# Patient Record
Sex: Female | Born: 1960 | Race: Black or African American | Hispanic: No | Smoking: Never smoker
Health system: Southern US, Community
[De-identification: ages and names within clinical notes are randomized; demographics above are authoritative.]

## PROBLEM LIST (undated history)

## (undated) DIAGNOSIS — Z8619 Personal history of other infectious and parasitic diseases: Secondary | ICD-10-CM

## (undated) DIAGNOSIS — N200 Calculus of kidney: Secondary | ICD-10-CM

## (undated) DIAGNOSIS — Z5189 Encounter for other specified aftercare: Secondary | ICD-10-CM

## (undated) DIAGNOSIS — K8020 Calculus of gallbladder without cholecystitis without obstruction: Secondary | ICD-10-CM

## (undated) DIAGNOSIS — C50211 Malignant neoplasm of upper-inner quadrant of right female breast: Secondary | ICD-10-CM

## (undated) DIAGNOSIS — D649 Anemia, unspecified: Secondary | ICD-10-CM

## (undated) DIAGNOSIS — Z8711 Personal history of peptic ulcer disease: Secondary | ICD-10-CM

## (undated) DIAGNOSIS — G4730 Sleep apnea, unspecified: Secondary | ICD-10-CM

## (undated) DIAGNOSIS — N390 Urinary tract infection, site not specified: Secondary | ICD-10-CM

## (undated) DIAGNOSIS — E119 Type 2 diabetes mellitus without complications: Secondary | ICD-10-CM

## (undated) DIAGNOSIS — I10 Essential (primary) hypertension: Secondary | ICD-10-CM

## (undated) DIAGNOSIS — Z8719 Personal history of other diseases of the digestive system: Secondary | ICD-10-CM

## (undated) HISTORY — DX: Essential (primary) hypertension: I10

## (undated) HISTORY — DX: Anemia, unspecified: D64.9

## (undated) HISTORY — DX: Type 2 diabetes mellitus without complications: E11.9

## (undated) HISTORY — DX: Malignant neoplasm of upper-inner quadrant of right female breast: C50.211

## (undated) HISTORY — DX: Sleep apnea, unspecified: G47.30

## (undated) HISTORY — DX: Personal history of other infectious and parasitic diseases: Z86.19

## (undated) HISTORY — DX: Urinary tract infection, site not specified: N39.0

## (undated) HISTORY — DX: Personal history of peptic ulcer disease: Z87.11

## (undated) HISTORY — PX: BREAST BIOPSY: SHX20

## (undated) HISTORY — DX: Encounter for other specified aftercare: Z51.89

## (undated) HISTORY — DX: Calculus of kidney: N20.0

## (undated) HISTORY — DX: Personal history of other diseases of the digestive system: Z87.19

## (undated) HISTORY — PX: SPLENECTOMY: SUR1306

## (undated) HISTORY — DX: Calculus of gallbladder without cholecystitis without obstruction: K80.20

---

## 1998-04-30 ENCOUNTER — Ambulatory Visit (HOSPITAL_COMMUNITY): Admission: RE | Admit: 1998-04-30 | Discharge: 1998-04-30 | Payer: Self-pay | Admitting: Urology

## 1998-04-30 ENCOUNTER — Encounter: Payer: Self-pay | Admitting: Urology

## 2001-05-02 ENCOUNTER — Ambulatory Visit (HOSPITAL_BASED_OUTPATIENT_CLINIC_OR_DEPARTMENT_OTHER): Admission: RE | Admit: 2001-05-02 | Discharge: 2001-05-02 | Payer: Self-pay | Admitting: Cardiovascular Disease

## 2002-11-13 ENCOUNTER — Ambulatory Visit (HOSPITAL_BASED_OUTPATIENT_CLINIC_OR_DEPARTMENT_OTHER): Admission: RE | Admit: 2002-11-13 | Discharge: 2002-11-13 | Payer: Self-pay | Admitting: *Deleted

## 2002-11-21 ENCOUNTER — Other Ambulatory Visit: Admission: RE | Admit: 2002-11-21 | Discharge: 2002-11-21 | Payer: Self-pay | Admitting: Obstetrics and Gynecology

## 2003-07-12 DIAGNOSIS — G4730 Sleep apnea, unspecified: Secondary | ICD-10-CM

## 2003-07-12 HISTORY — DX: Sleep apnea, unspecified: G47.30

## 2005-07-11 HISTORY — PX: OTHER SURGICAL HISTORY: SHX169

## 2005-12-27 ENCOUNTER — Inpatient Hospital Stay (HOSPITAL_COMMUNITY): Admission: EM | Admit: 2005-12-27 | Discharge: 2005-12-30 | Payer: Self-pay | Admitting: Emergency Medicine

## 2005-12-28 ENCOUNTER — Ambulatory Visit: Payer: Self-pay | Admitting: Cardiology

## 2005-12-28 ENCOUNTER — Encounter: Payer: Self-pay | Admitting: Cardiology

## 2007-07-12 DIAGNOSIS — Z5189 Encounter for other specified aftercare: Secondary | ICD-10-CM

## 2007-07-12 DIAGNOSIS — D649 Anemia, unspecified: Secondary | ICD-10-CM

## 2007-07-12 HISTORY — DX: Anemia, unspecified: D64.9

## 2007-07-12 HISTORY — DX: Encounter for other specified aftercare: Z51.89

## 2008-06-16 ENCOUNTER — Ambulatory Visit: Payer: Self-pay | Admitting: Obstetrics & Gynecology

## 2008-06-16 ENCOUNTER — Observation Stay (HOSPITAL_COMMUNITY): Admission: AD | Admit: 2008-06-16 | Discharge: 2008-06-17 | Payer: Self-pay | Admitting: Obstetrics & Gynecology

## 2008-06-25 ENCOUNTER — Ambulatory Visit: Payer: Self-pay | Admitting: Obstetrics & Gynecology

## 2008-06-25 ENCOUNTER — Encounter: Payer: Self-pay | Admitting: Obstetrics & Gynecology

## 2008-06-25 LAB — CONVERTED CEMR LAB
HCT: 26.5 % — ABNORMAL LOW (ref 36.0–46.0)
MCV: 80.5 fL (ref 78.0–100.0)

## 2010-08-01 ENCOUNTER — Encounter: Payer: Self-pay | Admitting: *Deleted

## 2010-08-30 ENCOUNTER — Encounter (INDEPENDENT_AMBULATORY_CARE_PROVIDER_SITE_OTHER): Payer: Self-pay | Admitting: *Deleted

## 2010-09-07 NOTE — Letter (Signed)
Summary: Plan of Care, Need to Discuss  [facility]  [address]   [place], [place] [address]   Phone: [phone]  Fax: [phone]    August 30, 2010  Kaitlyn Cervantes [address] [place], [place]  [address] June 12, 1961   Dear [name],   We are writing this letter to inform you of treatment plans and/or discuss your plan of care.  We have tried several times to contact you; however, we have yet to reach you.  We ask that you please contact our office for follow-up on your gastrointestinal issues.  We can  be reached at [phone] to schedule an appointment, or to speak with someone regarding your health care needs.  Please do not neglect your health.   Sincerely,    Rexene Alberts  [facility] Ph: [phone]    Fax: [phone]

## 2010-11-23 NOTE — Group Therapy Note (Signed)
NAMEDIALA, WAXMAN NO.:  [id number]   MEDICAL RECORD NO.:  [id number]          PATIENT TYPE:  WOC   LOCATION:  [facility]                   FACILITY:  [facility]   PHYSICIAN:  Sid Falcon, CNM  DATE OF BIRTH:  21-Nov-1960   DATE OF SERVICE:  06/25/2008                                  CLINIC NOTE   CHIEF COMPLAINT:  Increased vaginal bleeding that began on November 25  that has continued.  The patient was seen at the maternity admissions  unit on December 7, in which her hemoglobin was found to be 6.6 and  hematocrit 20.2.  The patient was symptomatic, dizzy, and had  orthostatic blood pressures.  The patient, initially, refused blood  transfusion; however, agreed and received 3 units of packed red blood  cells in which the dizziness resolved.  The patient was instructed to  follow up at the [facility] and hence, is here today for  followup.  The patient is reporting decreased bleeding today, has been  taking the Provera 10 mg p.o. for the past 3 days, has 3 more days left.   CURRENT MEDICATIONS:  Provera 10 mg, Norvasc 5 mg daily, lisinopril 20  mg daily, hydrochlorothiazide 25 mg daily.   MENSTRUAL HISTORY:  June 04, 2008 reports regular cycles up until 2  years ago, 21 days in between cycles, and the last one was 7 days.  No  bleeding in between periods.   OBSTETRICAL HISTORY:  A history of 4 pregnancies with 3 children all  resulting in a C. section.  Primary  C. section was due to fetal  distress.  Bilateral tubal ligation was done at the last C. section 19  years ago in 1990.   GYNECOLOGICAL HISTORY:  Last Pap smear 2005.  The patient denies history  of abnormal Pap smear.  The patient reports never having a mammogram.   SURGICAL HISTORY:  Kidney stones, splenectomy at 50 years of age.   FAMILY HISTORY:  Diabetes:  Husband, daughter, and mother.  High blood  pressure:  Patient.  Cancer:  Mother had breast cancer and had a double  mastectomy, resulting in death 2 years ago.   SOCIAL HISTORY:  The patient does work outside the home, lives with  husband.  No smoking, no illicit drug use, and denies current __________  abuse.   REVIEW OF SYSTEMS:  The patient is reporting swelling in legs and  occasional shortness of breath; is receiving followup with primary care  physician.   PHYSICAL EXAMINATION:  Temperature 98.4, pulse 80, blood pressure  152/89, weight 258 pounds.  Patient is alert and oriented x3, no signs  of acute distress.  No visible evidence of respiratory distress.  NECK:  No thyromegaly.  CHEST:  Lungs clear throughout auscultation bilaterally.  CARDIOVASCULAR:  Regular rate and rhythm without murmurs, gallops, or  rubs.  BREAST:  Nontender with palpation bilaterally.  No retractions.  No  nipple discharge or bleeding.  Small palpable lesion on right breast  approximately at 3 o'clock approximately 1 x 1 cm in size, mobile.  VAGINA:  Moderate amount of blood.  CERVIX:  Nontender with palpation.  Pap deferred secondary to increased  bleeding.   ASSESSMENT:  1. Vaginal bleeding.  2. Hypertension.  3. Family history of breast cancer.   PLAN:  Dr. Marice Potter consults with the patient in the room.  Will do Provera  10 mg x10 days for 3 months.  After the 3 months, patient will return  with followup with repeat ultrasound to check for endometrial  thickening.  If bleeding returns and increases we will do biopsy at that  time.  Patient will schedule an appointment at end of January for a Pap  smear once bleeding is not as much.      Sid Falcon, CNM     WM/MEDQ  D:  06/25/2008  T:  06/25/2008  Job:  [id number]

## 2010-11-26 NOTE — Discharge Summary (Signed)
NAMEKENDRAH, Kaitlyn Cervantes               ACCOUNT NO.:  [id number]   MEDICAL RECORD NO.:  [id number]          PATIENT TYPE:  INP   LOCATION:  3741                         FACILITY:  [facility]   PHYSICIAN:  Isidor Holts, M.D.  DATE OF BIRTH:  October 17, 1960   DATE OF ADMISSION:  12/27/2005  DATE OF DISCHARGE:  12/30/2005                                 DISCHARGE SUMMARY   PMD:  Unassigned.   DISCHARGE DIAGNOSES:  1.  Atypical right-sided chest pain, workup negative.  2.  Cervical spine degenerative joint disease, C4-C5 herniated disc/right      radiculopathy and spinal stenosis.  3.  Uncontrolled hypertension.  4.  Morbid obesity.  5.  Peptic ulcer disease/gastroesophageal reflux disease.   DISCHARGE MEDICATIONS:  1.  Hydrochlorothiazide 25 mg p.o. daily.  2.  Lisinopril 20 mg p.o. daily.  3.  Atenolol 25 mg p.o. daily.  4.  Prednisone 40 mg p.o. daily for 2 days, then 30 mg p.o. 2 day for 3      days, then 20 mg p.o. daily for 3 days and then 10 mg p.o. daily for 3      days and then stop.  5.  Darvocet-N 100 one p.o. p.r.n. q.8h. A total of 90 pills have been      dispensed.   PROCEDURES:  1.  Two-view chest x-ray dated December 27, 2005.  This showed no evidence of      active cardiopulmonary disease.  2.  MRI cervical spine dated December 28, 2005.  This showed moderately enlarged      disc protrusion on the right at C4-C5 with mass effect on the cord,      small central disc protrusion C5-C6 and C6-C7.  Multilevel DJD.  3.  Chest CT angiogram dated December 29, 2005.  This showed no evidence for      acute pulmonary embolism and is negative for aortic dissection or      pulmonary edema.  4.  Abdominal CT angiogram dated December 29, 2005.  This showed no evidence for      aortic dissection.  There was right nephrolithiasis.  5.  2-D echocardiogram dated December 28, 2005.  This showed LVEF 65% with no      left ventricular regional wall motion abnormalities.  This was      considered a normal echo  study.   CONSULTATIONS:  Dr. Wynetta Emery, neurosurgeon.   ADMISSION HISTORY:  As in H&P note of December 27, 2005 dictated by Dr. Jamison Oka.  However, in brief, this is a 50 year old female, with known history  of hypertension, prior peptic ulcer disease and GERD, noncompliant with anti-  hypertensive medications for the past 1-1/2 years, who presents with right-  sided chest pain associated with shortness of breath and palpitations of a  transient nature.  Subsequently a couple of days later, she developed  similar chest pain, associated with right-sided neck pain, radiating to the  shoulder.  According to the patient, she has been having some difficulty  rotating her neck and could not lift her right arm, secondary to pain.  She  presented to the emergency department and was admitted for further  evaluation, investigation and management.   CLINICAL COURSE:  1.  Atypical right-sided chest pain.  The patient presents with right-sided      chest pain of atypical nature, not associated with coughing or sputum      production, although she did mention shortness of breath and      palpitations.  Two-view chest x-ray was quite unremarkable.  D-dimer was      within normal limits.  Subsequent chest CT angiogram showed no evidence      of pulmonary embolism or aortic dissection, although there was mild      pulmonary edema.  Abdominal CT angiogram done at the same time, showed      incidental right nephrolithiasis.  2-D echocardiogram showed ejection      fraction of 65%.  There were no regional wall motion abnormalities.      This was considered a normal 2-D echocardiogram.  The patient therefore      has been reassured, as to the atypical nature of her chest pain.   1.  Severe uncontrolled hypertension.  The patient is a known hypertensive,      but has been noncompliant with anti-hypertensive medications, for the      past 1-1/2 years.  At the time of presentation in the emergency       department, she was found to have a blood pressure which was      significantly elevated at 181/97.  She was managed with a combination of      hydrochlorothiazide, ACE inhibitor and beta blocker with adequate blood      pressure control.  As of December 30, 2005, her blood pressure was      reasonably controlled at 144/68 mmHg.  Incidentally, on December 29, 2005      the patient had experienced palpitations, associated with a sinus      tachycardia seen in the rhythm strip on telemetry monitoring, with heart      rate in the 130s.  The patient was therefore placed on a beta blocker,      with complete amelioration of symptoms.  It is possible that anxiety may      have been contributory to this.  The patient's TSH was normal at 1.044.      As noted in #1 above, chest CT angiogram showed mild pulmonary edema.      This was probably secondary to left heart strain, as a result of      uncontrolled hypertension.  Certainly clinically, the patient had no      evidence of heart failure and 2-D echocardiogram is normal.  However,      this has been adequately controlled with ACE inhibition, diuretic and      beta blocker.  Clearly, the patient will need a primary M.D. to continue      to follow up routinely, and adjust medications as appropriate.   1.  History of GERD/peptic ulcer disease.  There were no symptoms referable      to these, during the course of hospital stay.   1.  Cervical spine DJD/spinal stenosis/radiculopathy.  The patient presents      with right-sided neck pain, difficulty in rotating neck and also right      shoulder pain.  These responded to analgesics, administered during the      course of her hospital stay.  However, on suspicion of possible  radiculopathy, neck MRI was carried out on December 28, 2005, which showed      multilevel DJD of the cervical spine and also moderately large disc     protrusion on the right at C4-C5, with mass effect on the cord.  There      was also a  small central disc protrusion at C5-C6 and C6-C7.  Dr. Wynetta Emery,      neurosurgeon was called in consultation.  He has evaluated the patient,      and agrees with analgesics and tapering course of steroids and feels      that in the future, the patient may require surgery, i.e., ACDF, but      states that this is not emergent.  He is quite willing to see patient on      follow up in his office following discharge, and has requested patient      to call, telephone [phone] to schedule an appointment.   1.  Morbid obesity.  The patient has been given dietary counseling and      advice.  Lipid profile showed the following findings:  Total cholesterol      187, triglycerides 87, HDL 39, LDL 131, i.e., fairly reasonable lipid      profile.   DISPOSITION:  The patient was considered sufficiently recovered and  clinically stable, to be discharged on December 30, 2005.  She was instructed to  return to work on January 02, 2006.   DIET:  Healthy Heart Diet.   ACTIVITY:  As tolerated.  The patient is recommended not to lift objects  over 10 pounds in weight.   WOUND CARE:  Not applicable.   PAIN MANAGEMENT:  Refer to discharge medication list.   FOLLOWUP INSTRUCTIONS:  The patient has been instructed to call Dr. Lonie Peak  office following discharge, i.e., telephone [phone] to schedule a  followup appointment.  She has also been strongly recommended to establish a  primary M.D. and follow up of background medical problems and preventive  care.  She has been supplied a telephone number for [facility].  She has assured me that she will call and make an appointment.      Isidor Holts, M.D.  Electronically Signed     CO/MEDQ  D:  12/30/2005  T:  12/30/2005  Job:  [id number]   cc:   Donalee Citrin, M.D.  Fax: [phone]

## 2010-11-26 NOTE — H&P (Signed)
Cervantes, Kaitlyn               ACCOUNT NO.:  [id number]   MEDICAL RECORD NO.:  [id number]          PATIENT TYPE:  EMS   LOCATION:  [facility]                         FACILITY:  [facility]   PHYSICIAN:  Mobolaji B. Bakare, M.D.DATE OF BIRTH:  Nov 25, 1960   DATE OF ADMISSION:  12/27/2005  DATE OF DISCHARGE:                                HISTORY & PHYSICAL   PRIMARY CARE PHYSICIAN:  Unassigned.   CHIEF COMPLAINT:  Chest pain for three days.   HISTORY OF PRESENT COMPLAINT:  [name] is a 50 year old African-American  female with known history of hypertension.  She quit using her medication  about 1-1/2 years ago.  She was in her usual state of health until this past  Sunday when she developed chest pain, right-sided.  It was not related to  exertion.  She had some shortness of breath and palpitations.  The pain  lasted less than 30 minutes.  It went away completely.  She subsequently  started feeling weak all through Monday.  This morning on waking up, she  developed similar chest pain, right-sided, radiating to her neck, shoulder.  There were no associated headaches.  The pain was dull in nature.  It lasted  approximately 30 minutes.  She could not turn her neck and could not lift  her right hand secondary to severe pain.  She was brought to the emergency  room by private vehicle.  She did receive two aspirin prior to arrival and  pain abated prior to arrival in the emergency room.   She has been evaluated here.  Two sets of cardiac markers at the point-of-  care have been negative.   EKG showed a normal sinus rhythm without acute ST changes.  The patient is  currently chest painfree, but she feels uncomfortable returning home; hence,  [facility] was called for admission and further evaluation.   She has had a D-dimer done, which is within normal.   She has no shortness of breath now.  No pleuritic chest pain.   REVIEW OF SYSTEMS:  She noted ankle swelling since three days ago,  which has  improved on elevation of her legs.  There are no headaches, changes of  vision.  No cough, diarrhea, abdominal pain, constipation.  She denies  paroxysmal nocturnal edema, but she thinks she has problems breathing  whenever she lays down; however, she uses only one pillow to sleep.  She has  had left-sided chest pain in the past, but this has never been evaluated.   PAST MEDICAL HISTORY:  Hypertension, for which she quit using medications 1-  1/2 years ago.  Patient has had peptic ulcer before and heartburn but  current complaints are not suggestive of similar heartburn.   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  The reason  for splenectomy is unknown.  She has had previous cesarean sections.   MEDICATIONS:  None.   ALLERGIES:  None.   FAMILY HISTORY:  She is married.  She has three children.  She is  accompanied by her daughter.  Mother is battling breast cancer, which  was  diagnosed five years ago.  She is not having recurrence.  The patient has  not had mammography, so I have advised her to have mammography done.  Father  died at the age of 84 years from heart failure.   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  Sits on the  computer all day.  She is fairly sedentary.  No history of drug abuse,  alcohol, or cigarette smoking.   PHYSICAL EXAMINATION:  INITIAL VITALS:  Blood pressure 181/97, now 154/95.  Pulse 74, respiratory rate 20, O2 sats 96% on room air.  Temperature 99.4.  Rechecked was 98.7.  GENERAL:  Patient is obese, not in respiratory distress.  HEENT:  Normocephalic and atraumatic head.  Pupils are equal, round and  reactive to light.  Extraocular muscles are intact.  Not pale.  Anicteric.  NECK:  No elevated JVD.  No carotid bruits.  No thyromegaly.  LUNGS:  Clear clinically to auscultation.  CVS:  S1 and S2 regular.  No murmur or gallop.  ABDOMEN:  Obese, soft, nontender.  No palpable organomegaly.  No   holosystolic murmur.  EXTREMITIES:  Trace pedal edema around the ankle.  Dorsalis pedis pulses  palpable bilaterally.  No cyanosis.  CNS:  Muscle power is 5/5 in all limbs.  Cranial nerves are intact.  No  focal neurological deficits.  MUSCULOSKELETAL:  There is no tenderness in the cervical region, over the  shoulder, or anterior chest wall.   INITIAL LABORATORY DATA:  Cardiac markers normal x1 set.  BNP less than 30.  D-dimer 0.22.  Venous pH 7.35, pCO2 41.  Hemoglobin 12.2, hematocrit 36.  Sodium 138, potassium 4, chloride 108, glucose 113, BUN 9, creatinine 0.7.   EKG showed normal sinus rhythm with a heart rate of 87.  No acute ST  changes.  There are nonspecific T wave abnormalities in V6.   CHEST X-RAY:  No acute cardiopulmonary disease.   ASSESSMENT/PLAN:  [name] is a 50 year old African-American female who is  morbidly obese.  Has hypertension.  She has not been on medications for 1-  1/2 years.  She is presenting with right-sided chest pain, neck pain, and  uncontrolled blood pressure.  She will be admitted for evaluation.  Problem 1:  Chest pain, which is atypical:  She has a low pretest  probability for pulmonary embolism.  D-dimer is negative.  She will be  admitted to telemetry.  Cycle cardiac enzymes.  Give Protonix 40 mg daily,  Mylanta p.r.n., aspirin 325 mg daily.  Check fasting lipid profile.  Hemoglobin A1C.  Fasting blood sugar.  We will rule out aortic dissection  with CT angiogram of the chest.  Obtain a 2D echocardiogram.  The patient  has had previous left-sided chest pain in the past.  Consideration will be  given to stress test if no other etiology found.  Problem 2:  Neck pain:  If cardiac workup is negative, consideration may be  given to assessing for cervical radiculopathy.  Patient is currently not  symptomatic.  Neck pain has resolved. Problem 3:  Uncontrolled hypertension:  Will start on hydrochlorothiazide  12.5 mg daily, lisinopril 20 mg  daily.  Problem 4:  Obesity:  Check TSH.  Give weight loss counseling.      Mobolaji B. Corky Downs, M.D.  Electronically Signed    MBB/MEDQ  D:  12/27/2005  T:  12/27/2005  Job:  [id number]

## 2011-04-15 LAB — CROSSMATCH: ABO/RH(D): O POS

## 2011-04-15 LAB — HEMOGLOBIN AND HEMATOCRIT, BLOOD: Hemoglobin: 6.6 g/dL — CL (ref 12.0–15.0)

## 2011-04-15 LAB — WET PREP, GENITAL
Clue Cells Wet Prep HPF POC: NONE SEEN
Trich, Wet Prep: NONE SEEN
Yeast Wet Prep HPF POC: NONE SEEN

## 2011-04-15 LAB — CBC
Hemoglobin: 8.6 g/dL — ABNORMAL LOW (ref 12.0–15.0)
MCV: 79.8 fL (ref 78.0–100.0)
RBC: 3.13 MIL/uL — ABNORMAL LOW (ref 3.87–5.11)
RDW: 16.1 % — ABNORMAL HIGH (ref 11.5–15.5)

## 2011-04-15 LAB — GC/CHLAMYDIA PROBE AMP, GENITAL: GC Probe Amp, Genital: NEGATIVE

## 2011-09-06 ENCOUNTER — Encounter: Payer: Self-pay | Admitting: Internal Medicine

## 2011-10-11 ENCOUNTER — Ambulatory Visit: Payer: Self-pay | Admitting: *Deleted

## 2011-10-11 NOTE — Progress Notes (Signed)
Pt is a NOS to her PV.  Attempted to reach her at her home twice and her work once.  No id on home machine, no message left.  NOS letter sent o pt and colonoscopy for 10-19-11 cancelled

## 2011-10-19 ENCOUNTER — Encounter: Payer: Self-pay | Admitting: Internal Medicine

## 2013-10-29 ENCOUNTER — Ambulatory Visit (INDEPENDENT_AMBULATORY_CARE_PROVIDER_SITE_OTHER): Payer: BC Managed Care – PPO | Admitting: Family Medicine

## 2013-10-29 ENCOUNTER — Encounter: Payer: Self-pay | Admitting: Family Medicine

## 2013-10-29 VITALS — BP 165/76 | HR 89 | Temp 98.6°F | Ht 64.0 in | Wt 245.0 lb

## 2013-10-29 DIAGNOSIS — E119 Type 2 diabetes mellitus without complications: Secondary | ICD-10-CM

## 2013-10-29 DIAGNOSIS — D649 Anemia, unspecified: Secondary | ICD-10-CM | POA: Insufficient documentation

## 2013-10-29 DIAGNOSIS — G4733 Obstructive sleep apnea (adult) (pediatric): Secondary | ICD-10-CM

## 2013-10-29 DIAGNOSIS — Z8669 Personal history of other diseases of the nervous system and sense organs: Secondary | ICD-10-CM

## 2013-10-29 DIAGNOSIS — I10 Essential (primary) hypertension: Secondary | ICD-10-CM

## 2013-10-29 DIAGNOSIS — Z87442 Personal history of urinary calculi: Secondary | ICD-10-CM | POA: Insufficient documentation

## 2013-10-29 LAB — COMPLETE METABOLIC PANEL WITH GFR
ALBUMIN: 3.8 g/dL (ref 3.5–5.2)
ALT: 18 U/L (ref 0–35)
AST: 17 U/L (ref 0–37)
Alkaline Phosphatase: 70 U/L (ref 39–117)
BUN: 16 mg/dL (ref 6–23)
CO2: 30 mEq/L (ref 19–32)
CREATININE: 0.79 mg/dL (ref 0.50–1.10)
Calcium: 9.2 mg/dL (ref 8.4–10.5)
Chloride: 104 mEq/L (ref 96–112)
GFR, Est Non African American: 86 mL/min
GLUCOSE: 147 mg/dL — AB (ref 70–99)
Potassium: 4.3 mEq/L (ref 3.5–5.3)
Sodium: 139 mEq/L (ref 135–145)
Total Bilirubin: 0.4 mg/dL (ref 0.2–1.2)
Total Protein: 6.3 g/dL (ref 6.0–8.3)

## 2013-10-29 LAB — CBC WITH DIFFERENTIAL/PLATELET
BASOS ABS: 0 10*3/uL (ref 0.0–0.1)
Basophils Relative: 0 % (ref 0–1)
EOS PCT: 2 % (ref 0–5)
Eosinophils Absolute: 0.1 10*3/uL (ref 0.0–0.7)
HEMATOCRIT: 32 % — AB (ref 36.0–46.0)
HEMOGLOBIN: 10.5 g/dL — AB (ref 12.0–15.0)
Lymphocytes Relative: 44 % (ref 12–46)
Lymphs Abs: 2.7 10*3/uL (ref 0.7–4.0)
MCH: 25.3 pg — ABNORMAL LOW (ref 26.0–34.0)
MCHC: 32.8 g/dL (ref 30.0–36.0)
MCV: 77.1 fL — AB (ref 78.0–100.0)
MONO ABS: 0.5 10*3/uL (ref 0.1–1.0)
MONOS PCT: 8 % (ref 3–12)
Neutro Abs: 2.8 10*3/uL (ref 1.7–7.7)
Neutrophils Relative %: 46 % (ref 43–77)
Platelets: 229 10*3/uL (ref 150–400)
RBC: 4.15 MIL/uL (ref 3.87–5.11)
RDW: 14.6 % (ref 11.5–15.5)
WBC: 6.1 10*3/uL (ref 4.0–10.5)

## 2013-10-29 LAB — POCT GLYCOSYLATED HEMOGLOBIN (HGB A1C): HEMOGLOBIN A1C: 7.2

## 2013-10-29 LAB — TSH: TSH: 1.17 u[IU]/mL (ref 0.350–4.500)

## 2013-10-29 LAB — LDL CHOLESTEROL, DIRECT: Direct LDL: 139 mg/dL — ABNORMAL HIGH

## 2013-10-29 MED ORDER — HYDROCHLOROTHIAZIDE 25 MG PO TABS: 25.0000 mg | ORAL_TABLET | Freq: Every day | ORAL | Status: DC

## 2013-10-29 MED ORDER — METFORMIN HCL 500 MG PO TABS: 500.0000 mg | ORAL_TABLET | Freq: Two times a day (BID) | ORAL | Status: DC

## 2013-10-29 MED ORDER — BLOOD GLUCOSE MONITORING SUPPL W/DEVICE KIT: PACK | Status: DC

## 2013-10-29 NOTE — Assessment & Plan Note (Signed)
Obtain kidney function today.

## 2013-10-29 NOTE — Assessment & Plan Note (Addendum)
Patient with hypertension today 165/76. Patient with hypertension history since 2006.  - Will start patient on HCTZ 25 mg. - She'll schedule nurse followup with one week after starting medication to have her blood pressure check. - Followup dependent upon blood pressure check, no more than 3 months.

## 2013-10-29 NOTE — Assessment & Plan Note (Signed)
We'll need to order a sleep study for the patient.  She likely is in need of a CPAP machine given her history. Will place referral today.

## 2013-10-29 NOTE — Progress Notes (Signed)
Subjective:    Patient ID: Kaitlyn Cervantes, female    DOB: [birth year], 53 y.o.   MRN: [id number]  HPI Kaitlyn Cervantes is a 53 y.o. African American female, presented clinic today for new patient establishment.  Hypertension: Patient states that she's had diagnosis of hypertension since 2006. However she admits to not having good medical care for herself or over the last few years. She has not been taking medications over the last year at least. She states she's been staying home taking care of her husband who had recently suffered from a stroke. She states that she use to take lisinopril and HCTZ for her hypertension. She denies chest pain, shortness of breath, palpitations, dyspnea, headache, dizziness or syncopal episodes. She endorses some mild visual changes she attributes to needing glasses. She does not monitor her blood pressure at home.  Diabetes: Patient reports she has been a diabetic since 2012. She does not watch her diet. She is familiar with diabetes because her daughter and husband both have diabetes. She does not monitor her blood glucose level. She does not have a monitoring kit. She has used her husband's kit on a few occasions over the last year and her blood sugars seem to run between 200 and 300. She states she use to take metformin and then Januvia. She denies any nonhealing wounds on her feet. She denies any numbness or tingling of her extremities.  Sleep apnea: Patient reports that she's had sleep apnea since 2005. At one point in time she was wearing his CPAP at night. She doesn't recall the last time she has worn her CPAP and states it's been years. She admits to snoring. She admits her family members say that she gasps for air on occasions during sleeping. She endorses daytime fatigue.  Kidney stones: Patient reports a history of kidney stones in 2007. She states at that time she had undergone lithotripsy. She doesn't recall if it was one-sided or bilateral. She has not had  any issues with kidney stones since that time. She has concerns about her kidney function today. She denies any dysuria, frequency or low back pain.  Anemia: Patient reports severe anemia back in 2009. She states she had a menstrual bleed for over a month. She was seen at [facility] hospital for this and required transfusions. She believes that was her last menstrual period.   Health maintenance: Patient's LMP was likely in 2009. Her last Pap was in 2014, all Paps have been normal. There is no mammogram in the system for her, she states it was completed 2014. She has never had a colonoscopy. Tetanus shot in 2012. Patient thinks he had a stress test in 2005.  Social: Patient is a nonsmoker, full-time employee for the [employer]. She's had 3 C-sections gravida 3 para 3.  She has no religious preferences. She does not exercise regularly. She does not use recreational drugs. She does not use alcohol. She feels safe in her relationship. Review of Systems Negative, with the exception of above mentioned in HPI     Objective:   Physical Exam BP 165/76  Pulse 89  Temp(Src) 98.6 F (37 C) (Oral)  Ht $R'5\' 4"'Ce$  (1.626 m)  Wt 245 lb (111.131 kg)  BMI 42.03 kg/m2 Gen: NAD. Pleasant, obese African American female. HEENT: AT. [place]. Bilateral TM visualized and normal in appearance, mild fluid behind tympanic membrane right greater than left. Bilateral eyes without injections or icterus. MMM. Bilateral nares pale. Throat without erythema or exudates.  Neck: Supple. Mild fullness of her thyroid. Trachea midline. No lymphadenopathy CV: RRR,  No murmurs clicks gallops or rubs. Chest: CTAB, no wheeze or crackles Abd: Soft. Obese .NTND. BS present. No Masses palpated.  Ext: No erythema. No edema. +2/4 posterior tibialis pulse  Skin: No rashes, purpura or petechiae.  Neuro:  Normal gait. PERLA. EOMi. Alert. Grossly intact.  Psych: Normal affect, dressed, demeanor and mood. Normal speech

## 2013-10-29 NOTE — Assessment & Plan Note (Signed)
Patient with history of significant anemia. We'll obtain CBC today in the office. Will call patient with lab results and decide upon further management after results are obtained

## 2013-10-29 NOTE — Patient Instructions (Signed)
Blood Glucose Monitoring, Adult  Monitoring your blood glucose (also know as blood sugar) helps you to manage your diabetes. It also helps you and your health care provider monitor your diabetes and determine how well your treatment plan is working.  WHY SHOULD YOU MONITOR YOUR BLOOD GLUCOSE?  · It can help you understand how food, exercise, and medicine affect your blood glucose.  · It allows you to know what your blood glucose is at any given moment. You can quickly tell if you are having low blood glucose (hypoglycemia) or high blood glucose (hyperglycemia).  · It can help you and your health care provider know how to adjust your medicines.  · It can help you understand how to manage an illness or adjust medicine for exercise.  WHEN SHOULD YOU TEST?  Your health care provider will help you decide how often you should check your blood glucose. This may depend on the type of diabetes you have, your diabetes control, or the types of medicines you are taking. Be sure to write down all of your blood glucose readings so that this information can be reviewed with your health care provider. See below for examples of testing times that your health care provider may suggest.  Type 1 Diabetes  · Test 4 times a day if you are in good control, using an insulin pump, or perform multiple daily injections.  · If your diabetes is not well-controlled or if you are sick, you may need to monitor more often.  · It is a good idea to also monitor:  · Before and after exercise.  · Between meals and 2 hours after a meal.  · Occasionally between 2:00 to 3:00 am.  Type 2 Diabetes  · It can vary with each person, but generally, if you are on insulin, test 4 times a day.  · If you take medicines by mouth (orally), test 2 times a day.  · If you are on a controlled diet, test once a day.  · If your diabetes is not well controlled or if you are sick, you may need to monitor more often.  HOW TO MONITOR YOUR BLOOD GLUCOSE  Supplies  Needed  · Blood glucose meter.  · Test strips for your meter. Each meter has its own strips. You must use the strips that go with your own meter.  · A pricking needle (lancet).  · A device that holds the lancet (lancing device).  · A journal or log book to write down your results.  Procedure  · Wash your hands with soap and water. Alcohol is not preferred.  · Prick the side of your finger (not the tip) with the lancet.  · Gently milk the finger until a small drop of blood appears.  · Follow the instructions that come with your meter for inserting the test strip, applying blood to the strip, and using your blood glucose meter.  Other Areas to Get Blood for Testing  Some meters allow you to use other areas of your body (other than your finger) to test your blood. These areas are called alternative sites. The most common alternative sites are:  · The forearm.  · The thigh.  · The back area of the lower leg.  · The palm of the hand.  The blood flow in these areas is slower. Therefore, the blood glucose values you get may be delayed, and the numbers are different from what you would get from your fingers. Do not use alternative   sites if you think you are having hypoglycemia. Your reading will not be accurate. Always use a finger if you are having hypoglycemia. Also, if you cannot feel your lows (hypoglycemia unawareness), always use your fingers for your blood glucose checks.  ADDITIONAL TIPS FOR GLUCOSE MONITORING  · Do not reuse lancets.  · Always carry your supplies with you.  · All blood glucose meters have a 24-hour "hotline" number to call if you have questions or need help.  · Adjust (calibrate) your blood glucose meter with a control solution after finishing a few boxes of strips.  BLOOD GLUCOSE RECORD KEEPING  It is a good idea to keep a daily record or log of your blood glucose readings. Most glucose meters, if not all, keep your glucose records stored in the meter. Some meters come with the ability to download  your records to your home computer. Keeping a record of your blood glucose readings is especially helpful if you are wanting to look for patterns. Make notes to go along with the blood glucose readings because you might forget what happened at that exact time. Keeping good records helps you and your health care provider to work together to achieve good diabetes management.   Document Released: 06/30/2003 Document Revised: 02/27/2013 Document Reviewed: 11/19/2012  [employer]® Patient Information ©2014 [employer].

## 2013-10-29 NOTE — Assessment & Plan Note (Signed)
Patient with hemoglobin A1c 7.2 today. Patient has not been medicated since 2012. Routine lab work ordered today. We'll start a low-dose metformin slow increase. Patient aware to set up appointment with diabetes education Lamont Dowdy. Will call in monitor and supplies today. Patient to check her blood sugar fasting every morning and after dinner. Followup in 4 weeks

## 2013-10-30 ENCOUNTER — Telehealth: Payer: Self-pay | Admitting: Family Medicine

## 2013-10-30 DIAGNOSIS — E785 Hyperlipidemia, unspecified: Secondary | ICD-10-CM | POA: Insufficient documentation

## 2013-10-30 MED ORDER — SIMVASTATIN 20 MG PO TABS: 20.0000 mg | ORAL_TABLET | Freq: Every day | ORAL | Status: DC

## 2013-10-30 NOTE — Telephone Encounter (Signed)
Please call pt and inform all her her labs were normal, with the exception of elevated sugar and cholesterol. I would like her to start a cholesterol lowering medication. I have called this in for her. I would also send her a copy of these labs in the mail. Thanks.

## 2013-12-23 ENCOUNTER — Other Ambulatory Visit: Payer: Self-pay | Admitting: *Deleted

## 2013-12-23 DIAGNOSIS — E785 Hyperlipidemia, unspecified: Secondary | ICD-10-CM

## 2013-12-23 MED ORDER — SIMVASTATIN 20 MG PO TABS: 20.0000 mg | ORAL_TABLET | Freq: Every day | ORAL | Status: DC

## 2013-12-23 NOTE — Telephone Encounter (Signed)
90 day supply request.  Derl Barrow, RN

## 2014-02-04 ENCOUNTER — Other Ambulatory Visit: Payer: Self-pay | Admitting: *Deleted

## 2014-02-04 DIAGNOSIS — I10 Essential (primary) hypertension: Secondary | ICD-10-CM

## 2014-02-04 NOTE — Telephone Encounter (Signed)
90 day supply. Jasiel Belisle L, RN  

## 2014-02-05 MED ORDER — HYDROCHLOROTHIAZIDE 25 MG PO TABS: 25.0000 mg | ORAL_TABLET | Freq: Every day | ORAL | Status: DC

## 2014-02-06 ENCOUNTER — Telehealth: Payer: Self-pay | Admitting: Family Medicine

## 2014-02-06 ENCOUNTER — Other Ambulatory Visit: Payer: Self-pay | Admitting: *Deleted

## 2014-02-06 DIAGNOSIS — I10 Essential (primary) hypertension: Secondary | ICD-10-CM

## 2014-02-06 MED ORDER — HYDROCHLOROTHIAZIDE 25 MG PO TABS: 25.0000 mg | ORAL_TABLET | Freq: Every day | ORAL | Status: DC

## 2014-02-06 NOTE — Telephone Encounter (Signed)
Please call patient and tell her that I have called in refills on the medication requested today. However she is due to make an appointment for diabetes followup and hypertension followup. She should be seen within the next few weeks. Please have her schedule appointment as soon as possible. thank you

## 2014-02-25 ENCOUNTER — Telehealth: Payer: Self-pay | Admitting: Family Medicine

## 2014-02-25 NOTE — Telephone Encounter (Signed)
Pt called because her pharmacy sent a letter stating the her medication Hydrochlorothiazide has been recalled and she wanted to know what  She is supposed to do. Please call her at [phone]. jw

## 2014-02-25 NOTE — Telephone Encounter (Signed)
Spoke with patient about the medication. Patient was also inquiring about her having dark stools

## 2014-08-13 ENCOUNTER — Ambulatory Visit: Payer: Self-pay | Admitting: Family Medicine

## 2014-08-22 ENCOUNTER — Emergency Department (HOSPITAL_COMMUNITY)
Admission: EM | Admit: 2014-08-22 | Discharge: 2014-08-23 | Disposition: A | Discharge status: Home / Self Care | Payer: BLUE CROSS/BLUE SHIELD | Attending: Emergency Medicine | Admitting: Emergency Medicine

## 2014-08-22 ENCOUNTER — Encounter (HOSPITAL_COMMUNITY): Payer: Self-pay | Admitting: *Deleted

## 2014-08-22 DIAGNOSIS — Z8669 Personal history of other diseases of the nervous system and sense organs: Secondary | ICD-10-CM | POA: Diagnosis not present

## 2014-08-22 DIAGNOSIS — N201 Calculus of ureter: Secondary | ICD-10-CM | POA: Diagnosis not present

## 2014-08-22 DIAGNOSIS — Z862 Personal history of diseases of the blood and blood-forming organs and certain disorders involving the immune mechanism: Secondary | ICD-10-CM | POA: Insufficient documentation

## 2014-08-22 DIAGNOSIS — Z87442 Personal history of urinary calculi: Secondary | ICD-10-CM | POA: Insufficient documentation

## 2014-08-22 DIAGNOSIS — E119 Type 2 diabetes mellitus without complications: Secondary | ICD-10-CM | POA: Diagnosis not present

## 2014-08-22 DIAGNOSIS — I10 Essential (primary) hypertension: Secondary | ICD-10-CM | POA: Diagnosis not present

## 2014-08-22 DIAGNOSIS — R1010 Upper abdominal pain, unspecified: Secondary | ICD-10-CM | POA: Diagnosis present

## 2014-08-22 DIAGNOSIS — R109 Unspecified abdominal pain: Secondary | ICD-10-CM

## 2014-08-22 LAB — CBC WITH DIFFERENTIAL/PLATELET
Basophils Absolute: 0 10*3/uL (ref 0.0–0.1)
Basophils Relative: 0 % (ref 0–1)
Eosinophils Absolute: 0.1 10*3/uL (ref 0.0–0.7)
Eosinophils Relative: 1 % (ref 0–5)
HEMATOCRIT: 41.2 % (ref 36.0–46.0)
HEMOGLOBIN: 13.1 g/dL (ref 12.0–15.0)
LYMPHS ABS: 2.5 10*3/uL (ref 0.7–4.0)
LYMPHS PCT: 30 % (ref 12–46)
MCH: 24.4 pg — ABNORMAL LOW (ref 26.0–34.0)
MCHC: 31.8 g/dL (ref 30.0–36.0)
MCV: 76.7 fL — ABNORMAL LOW (ref 78.0–100.0)
MONO ABS: 0.7 10*3/uL (ref 0.1–1.0)
MONOS PCT: 8 % (ref 3–12)
NEUTROS ABS: 5.1 10*3/uL (ref 1.7–7.7)
Neutrophils Relative %: 61 % (ref 43–77)
Platelets: 278 10*3/uL (ref 150–400)
RBC: 5.37 MIL/uL — ABNORMAL HIGH (ref 3.87–5.11)
RDW: 15.9 % — ABNORMAL HIGH (ref 11.5–15.5)
WBC: 8.3 10*3/uL (ref 4.0–10.5)

## 2014-08-22 LAB — TROPONIN I

## 2014-08-22 LAB — COMPREHENSIVE METABOLIC PANEL
ALBUMIN: 4 g/dL (ref 3.5–5.2)
ALT: 17 U/L (ref 0–35)
ANION GAP: 8 (ref 5–15)
AST: 21 U/L (ref 0–37)
Alkaline Phosphatase: 114 U/L (ref 39–117)
BILIRUBIN TOTAL: 0.4 mg/dL (ref 0.3–1.2)
BUN: 11 mg/dL (ref 6–23)
CHLORIDE: 102 mmol/L (ref 96–112)
CO2: 28 mmol/L (ref 19–32)
CREATININE: 1.09 mg/dL (ref 0.50–1.10)
Calcium: 9.1 mg/dL (ref 8.4–10.5)
GFR calc Af Amer: 65 mL/min — ABNORMAL LOW (ref >90)
GFR calc non Af Amer: 56 mL/min — ABNORMAL LOW (ref >90)
GLUCOSE: 189 mg/dL — AB (ref 70–99)
POTASSIUM: 3.5 mmol/L (ref 3.5–5.1)
SODIUM: 138 mmol/L (ref 135–145)
Total Protein: 7.6 g/dL (ref 6.0–8.3)

## 2014-08-22 LAB — URINE MICROSCOPIC-ADD ON

## 2014-08-22 LAB — URINALYSIS, ROUTINE W REFLEX MICROSCOPIC
Bilirubin Urine: NEGATIVE
GLUCOSE, UA: 100 mg/dL — AB
Ketones, ur: NEGATIVE mg/dL
Nitrite: NEGATIVE
PH: 7.5 (ref 5.0–8.0)
Protein, ur: NEGATIVE mg/dL
Specific Gravity, Urine: 1.016 (ref 1.005–1.030)
Urobilinogen, UA: 1 mg/dL (ref 0.0–1.0)

## 2014-08-22 LAB — LIPASE, BLOOD: LIPASE: 27 U/L (ref 11–59)

## 2014-08-22 MED ORDER — MORPHINE SULFATE 4 MG/ML IJ SOLN
4.0000 mg | Freq: Once | INTRAMUSCULAR | Status: DC
  Filled 2014-08-22 (×2): qty 1

## 2014-08-22 MED ORDER — TRAMADOL HCL 50 MG PO TABS
50.0000 mg | ORAL_TABLET | Freq: Once | ORAL | Status: AC
  Administered 2014-08-22: 50 mg via ORAL
  Filled 2014-08-22: qty 1

## 2014-08-22 MED ORDER — HYDRALAZINE HCL 20 MG/ML IJ SOLN
10.0000 mg | INTRAMUSCULAR | Status: AC
  Administered 2014-08-22: 10 mg via INTRAVENOUS
  Filled 2014-08-22: qty 1

## 2014-08-22 MED ORDER — ONDANSETRON HCL 4 MG/2ML IJ SOLN
4.0000 mg | Freq: Once | INTRAMUSCULAR | Status: AC
  Administered 2014-08-22: 4 mg via INTRAVENOUS
  Filled 2014-08-22: qty 2

## 2014-08-22 MED ORDER — SODIUM CHLORIDE 0.9 % IV BOLUS (SEPSIS)
1000.0000 mL | Freq: Once | INTRAVENOUS | Status: AC
  Administered 2014-08-22: 1000 mL via INTRAVENOUS

## 2014-08-22 NOTE — ED Notes (Signed)
abd pain and vomiting for 45 minutes

## 2014-08-22 NOTE — ED Notes (Signed)
Pt refusing morphine at this time, reports wanting something less than morphine. Jen PA aware; no new orders at this time

## 2014-08-22 NOTE — ED Notes (Signed)
Jen PA at bedside. 

## 2014-08-22 NOTE — ED Notes (Signed)
Pt c/o NV x 3 hours; reports right sided abdominal pain x 1 hour. Abd nontender on palpation. Pt reports hx of hypertension; has not taken antihypertension medications x 8 months.

## 2014-08-22 NOTE — ED Notes (Signed)
Pt given water for PO challenge; maintaining fluids without difficulty.

## 2014-08-22 NOTE — ED Provider Notes (Signed)
CSN: [id number]     Arrival date & time 08/22/14  1911 History   First MD Initiated Contact with Patient 08/22/14 2118     Chief Complaint  Patient presents with  . Abdominal Pain     (Consider location/radiation/quality/duration/timing/severity/associated sxs/prior Treatment) HPI Comments: Patient is a 54 year old female past medical history significant for DM, HTN, anemia, HLD presenting to the emergency department for acute onset upper abdominal pain with nausea and multiple episodes of nonbloody nonbilious vomiting that started around 5:45 PM. Patient states the nausea and vomiting started prior to the abdominal pain. She states her last episode of emesis was just prior to arrival. No modifying factors identified. Denies any diarrhea, constipation, chest pain, shortness of breath, lower extremity swelling, headaches. Abdominal surgical history includes splenectomy, cesarean section. She has not been taking any of her at-home medications for the last 8 months.  Patient is a 54 y.o. female presenting with abdominal pain.  Abdominal Pain Associated symptoms: nausea and vomiting   Associated symptoms: no constipation and no diarrhea     Past Medical History  Diagnosis Date  . Diabetes mellitus without complication   . Hypertension   . Sleep apnea 2005  . Anemia   . Blood transfusion without reported diagnosis   . Kidney stones    Past Surgical History  Procedure Laterality Date  . Kidney stones  2007  . Cesarean section    . Splenectomy     Family History  Problem Relation Age of Onset  . Cancer Mother   . Heart disease Father   . Cancer Sister   . Cancer Maternal Aunt    History  Substance Use Topics  . Smoking status: Never Smoker   . Smokeless tobacco: Never Used  . Alcohol Use: No   OB History    No data available     Review of Systems  Gastrointestinal: Positive for nausea, vomiting and abdominal pain. Negative for diarrhea, constipation and blood in stool.   All other systems reviewed and are negative.     Allergies  Iohexol  Home Medications   Prior to Admission medications   Medication Sig Start Date End Date Taking? Authorizing Provider  Multiple Vitamin (MULTI-VITAMIN PO) Take 1 tablet by mouth daily.   Yes Historical Provider, MD  ranitidine (ZANTAC) 150 MG tablet Take 150 mg by mouth 2 (two) times daily.   Yes Historical Provider, MD  hydrochlorothiazide (HYDRODIURIL) 25 MG tablet Take 1 tablet (25 mg total) by mouth daily. Patient not taking: Reported on 08/22/2014 02/06/14   Renee A Kuneff, DO  metFORMIN (GLUCOPHAGE) 500 MG tablet Take 1 tablet (500 mg total) by mouth 2 (two) times daily with a meal. Patient not taking: Reported on 08/22/2014 10/29/13   Renee A Kuneff, DO  simvastatin (ZOCOR) 20 MG tablet Take 1 tablet (20 mg total) by mouth daily. Patient not taking: Reported on 08/22/2014 12/23/13   Renee A Kuneff, DO   BP 175/71 mmHg  Pulse 102  Temp(Src) 97.9 F (36.6 C) (Oral)  Resp 22  SpO2 100% Physical Exam  Constitutional: She is oriented to person, place, and time. She appears well-developed and well-nourished. No distress.  HENT:  Head: Normocephalic and atraumatic.  Right Ear: External ear normal.  Left Ear: External ear normal.  Nose: Nose normal.  Mouth/Throat: Uvula is midline and oropharynx is clear and moist. Mucous membranes are dry.  Eyes: Conjunctivae are normal.  Neck: Normal range of motion. Neck supple.  Cardiovascular: Normal rate.   Pulmonary/Chest:  Effort normal.  Abdominal: Soft. Normal appearance and bowel sounds are normal. There is no tenderness. There is no rigidity, no rebound and no guarding.  Musculoskeletal: Normal range of motion.  Neurological: She is alert and oriented to person, place, and time.  Skin: Skin is warm and dry. She is not diaphoretic.  Psychiatric: She has a normal mood and affect.  Nursing note and vitals reviewed.   ED Course  Procedures (including critical care  time) Medications  morphine 4 MG/ML injection 4 mg (4 mg Intravenous Not Given 08/22/14 2202)  traMADol (ULTRAM) tablet 50 mg (not administered)  sodium chloride 0.9 % bolus 1,000 mL (1,000 mLs Intravenous New Bag/Given 08/22/14 2159)  ondansetron (ZOFRAN) injection 4 mg (4 mg Intravenous Given 08/22/14 2151)  hydrALAZINE (APRESOLINE) injection 10 mg (10 mg Intravenous Given 08/22/14 2151)    Labs Review Labs Reviewed  CBC WITH DIFFERENTIAL/PLATELET - Abnormal; Notable for the following:    RBC 5.37 (*)    MCV 76.7 (*)    MCH 24.4 (*)    RDW 15.9 (*)    All other components within normal limits  COMPREHENSIVE METABOLIC PANEL - Abnormal; Notable for the following:    Glucose, Bld 189 (*)    GFR calc non Af Amer 56 (*)    GFR calc Af Amer 65 (*)    All other components within normal limits  URINALYSIS, ROUTINE W REFLEX MICROSCOPIC - Abnormal; Notable for the following:    Glucose, UA 100 (*)    Hgb urine dipstick MODERATE (*)    Leukocytes, UA MODERATE (*)    All other components within normal limits  URINE MICROSCOPIC-ADD ON - Abnormal; Notable for the following:    Bacteria, UA FEW (*)    All other components within normal limits  LIPASE, BLOOD  TROPONIN I    Imaging Review No results found.   EKG Interpretation None      MDM   Final diagnoses:  AP (abdominal pain)    Filed Vitals:   08/22/14 2232  BP: 175/71  Pulse: 102  Temp:   Resp: 22   Afebrile, NAD, non-toxic appearing, AAOx4.  I have reviewed nursing notes, vital signs, and all appropriate lab and imaging results for this patient.  CT abd/pelv pending at shift change, signed out to [employer], PA-C. Patient d/w with Dr. Jeneen Rinks, agrees with plan.      Harlow Mares, PA-C 08/22/14 2311  Tanna Furry, MD 08/27/14 1630

## 2014-08-23 ENCOUNTER — Encounter (HOSPITAL_COMMUNITY): Payer: Self-pay | Admitting: Radiology

## 2014-08-23 ENCOUNTER — Emergency Department (HOSPITAL_COMMUNITY): Payer: BLUE CROSS/BLUE SHIELD

## 2014-08-23 MED ORDER — FENTANYL CITRATE 0.05 MG/ML IJ SOLN
100.0000 ug | Freq: Once | INTRAMUSCULAR | Status: AC
  Administered 2014-08-23: 50 ug via INTRAVENOUS
  Filled 2014-08-23: qty 2

## 2014-08-23 MED ORDER — ONDANSETRON HCL 4 MG PO TABS: 4.0000 mg | ORAL_TABLET | Freq: Four times a day (QID) | ORAL | Status: DC

## 2014-08-23 MED ORDER — ONDANSETRON HCL 4 MG/2ML IJ SOLN
4.0000 mg | Freq: Once | INTRAMUSCULAR | Status: AC
  Administered 2014-08-23: 4 mg via INTRAVENOUS
  Filled 2014-08-23: qty 2

## 2014-08-23 MED ORDER — HYDROCODONE-ACETAMINOPHEN 5-325 MG PO TABS: 1.0000 | ORAL_TABLET | ORAL | Status: DC | PRN

## 2014-08-23 NOTE — Discharge Instructions (Signed)

## 2014-08-23 NOTE — ED Notes (Signed)
Pt c/o headache at this time. NP aware

## 2014-08-23 NOTE — ED Notes (Signed)
Patient is alert and orientedx4.  Patient was explained discharge instructions and they understood them with no questions.  The patient's daughter, Noe Gens is taking the patient home.

## 2014-08-23 NOTE — ED Notes (Signed)
Nehemiah Settle NP at bedside

## 2014-08-23 NOTE — ED Provider Notes (Signed)
Patient care received from [facility], PA-C, at end of shift.  Patient with abdominal pain today, nausea, vomiting. No fever. CT scan findings of right ureterolithiasis with hydronephrosis and ?forniceal rupture. Pain controlled with medications. No further vomiting. Renal functions WNL. Discussed CT findings with Dr. Lita Mains. Also discussed with Dr. Glo Herring (urology) to attempt to arrange outpatient recheck given size of stone and other CT findings. Ok to discharge home with return precautions - fever, uncontrolled pain and/or vomiting, new concern. Refer to urology office next week for follow up and further intervention.  Dewaine Oats, PA-C 08/23/14 0237  Dewaine Oats, PA-C 08/23/14 0530  Julianne Rice, MD 08/23/14 [phone]

## 2014-08-26 ENCOUNTER — Ambulatory Visit: Payer: BC Managed Care – PPO | Admitting: Family Medicine

## 2014-08-27 ENCOUNTER — Encounter: Payer: Self-pay | Admitting: Family Medicine

## 2014-08-27 ENCOUNTER — Ambulatory Visit (INDEPENDENT_AMBULATORY_CARE_PROVIDER_SITE_OTHER): Payer: BLUE CROSS/BLUE SHIELD | Admitting: Family Medicine

## 2014-08-27 VITALS — BP 195/92 | HR 84 | Temp 98.5°F | Ht 64.0 in | Wt 228.9 lb

## 2014-08-27 DIAGNOSIS — I10 Essential (primary) hypertension: Secondary | ICD-10-CM

## 2014-08-27 DIAGNOSIS — N132 Hydronephrosis with renal and ureteral calculous obstruction: Secondary | ICD-10-CM | POA: Diagnosis not present

## 2014-08-27 DIAGNOSIS — N1330 Unspecified hydronephrosis: Secondary | ICD-10-CM | POA: Insufficient documentation

## 2014-08-27 MED ORDER — LISINOPRIL 10 MG PO TABS: 10.0000 mg | ORAL_TABLET | Freq: Every day | ORAL | Status: DC

## 2014-08-27 MED ORDER — AMLODIPINE BESYLATE 10 MG PO TABS: 10.0000 mg | ORAL_TABLET | Freq: Every day | ORAL | Status: DC

## 2014-08-27 NOTE — Assessment & Plan Note (Signed)
BP markedly elevated today but patient is a symptomatic. Starting lisinopril and amlodipine today. Patient needs follow up BMP in ~ 7-10 days (future order placed). Patient also has documented history of obstructive sleep apnea. Patient needs to follow-up with PCP regarding this as this is likely contributing to her uncontrolled hypertension as well.

## 2014-08-27 NOTE — Assessment & Plan Note (Signed)
Patient with recent CT abdomen and pelvis which revealed obstructing 8 mm calculus just distal to the right UPJ.  Urology referral placed today as this needs close follow up.

## 2014-08-27 NOTE — Patient Instructions (Signed)
It was nice to see you today.  I have started you on lisinopril as well as amlodipine for your blood pressure. You need a lab check an approximate 7-10 days.  Please schedule this on her way out.  I have also placed referral to urology given your obstructive stone.  Someone will be in contact regarding appointment.  Please follow-up closely with Dr. Raoul Pitch.    Take care  Dr. Lacinda Axon

## 2014-08-27 NOTE — Progress Notes (Signed)
   Subjective:    Patient ID: Kaitlyn Cervantes, female    DOB: Apr 17, 1961, 54 y.o.   MRN: [id number]  HPI 54 year old female with uncontrolled hypertension, type 2 diabetes, and DM 2 presents for same day appointment with complaints of elevated blood pressure.  1) HTN  Patient reports that she was recently seen in the emergency department on 2/12.  At that time her blood pressure was found to be very elevated with systolic in the 453'M.  She is currently asymptomatic.   Medications - she is not taking any medications for her hypertension at this time.  She states that the last time she took medications was in August 2015.  Compliance -  See above.   ROS: Denies chest pain, SOB, lightheadedness/dizziness, headache.   2) Abdominal pain, N/V  Patient was seen in the emergency department for the above symptoms on 2/12.  CT abdomen and pelvis was obtained and revealed acute obstructive uropathy on the right with an obstructing 8 mm calculus just distal to the right UPJ & probable forniceal rupture.  Patient is currently a symptomatic. She has no current abdominal pain or recent nausea vomiting.  Patient was encouraged to follow-up urology but has not done so.   No reports of hematuria, dysuria, decreased urine output.  Review of Systems Per HPI    Objective:   Physical Exam Filed Vitals:   08/27/14 0922  BP: 195/92  Pulse: 84  Temp: 98.5 F (36.9 C)  Exam: General: well appearing, NAD. Cardiovascular: RRR. No murmurs, rubs, or gallops. Respiratory: CTAB. No rales, rhonchi, or wheeze. Abdomen: soft, nontender, nondistended. No palpable organomegaly.    Assessment & Plan:  See Problem List

## 2014-09-08 ENCOUNTER — Other Ambulatory Visit: Payer: BC Managed Care – PPO

## 2014-09-19 ENCOUNTER — Ambulatory Visit (INDEPENDENT_AMBULATORY_CARE_PROVIDER_SITE_OTHER): Payer: BLUE CROSS/BLUE SHIELD | Admitting: Family Medicine

## 2014-09-19 ENCOUNTER — Encounter: Payer: Self-pay | Admitting: Family Medicine

## 2014-09-19 VITALS — BP 157/91 | HR 74 | Temp 98.1°F | Ht 64.0 in | Wt 227.5 lb

## 2014-09-19 DIAGNOSIS — N200 Calculus of kidney: Secondary | ICD-10-CM

## 2014-09-19 DIAGNOSIS — E119 Type 2 diabetes mellitus without complications: Secondary | ICD-10-CM

## 2014-09-19 DIAGNOSIS — IMO0002 Reserved for concepts with insufficient information to code with codable children: Secondary | ICD-10-CM

## 2014-09-19 DIAGNOSIS — D649 Anemia, unspecified: Secondary | ICD-10-CM

## 2014-09-19 DIAGNOSIS — I10 Essential (primary) hypertension: Secondary | ICD-10-CM

## 2014-09-19 DIAGNOSIS — K5900 Constipation, unspecified: Secondary | ICD-10-CM | POA: Insufficient documentation

## 2014-09-19 DIAGNOSIS — E1165 Type 2 diabetes mellitus with hyperglycemia: Secondary | ICD-10-CM

## 2014-09-19 DIAGNOSIS — Z8669 Personal history of other diseases of the nervous system and sense organs: Secondary | ICD-10-CM

## 2014-09-19 DIAGNOSIS — G4733 Obstructive sleep apnea (adult) (pediatric): Secondary | ICD-10-CM

## 2014-09-19 LAB — CBC WITH DIFFERENTIAL/PLATELET
BASOS PCT: 0 % (ref 0–1)
Basophils Absolute: 0 10*3/uL (ref 0.0–0.1)
EOS ABS: 0.1 10*3/uL (ref 0.0–0.7)
EOS PCT: 2 % (ref 0–5)
HCT: 40.8 % (ref 36.0–46.0)
Hemoglobin: 12.6 g/dL (ref 12.0–15.0)
Lymphocytes Relative: 37 % (ref 12–46)
Lymphs Abs: 1.8 10*3/uL (ref 0.7–4.0)
MCH: 24.2 pg — AB (ref 26.0–34.0)
MCHC: 30.9 g/dL (ref 30.0–36.0)
MCV: 78.5 fL (ref 78.0–100.0)
MPV: 10.8 fL (ref 8.6–12.4)
Monocytes Absolute: 0.4 10*3/uL (ref 0.1–1.0)
Monocytes Relative: 9 % (ref 3–12)
Neutro Abs: 2.5 10*3/uL (ref 1.7–7.7)
Neutrophils Relative %: 52 % (ref 43–77)
PLATELETS: 313 10*3/uL (ref 150–400)
RBC: 5.2 MIL/uL — AB (ref 3.87–5.11)
RDW: 16.4 % — ABNORMAL HIGH (ref 11.5–15.5)
WBC: 4.9 10*3/uL (ref 4.0–10.5)

## 2014-09-19 LAB — BASIC METABOLIC PANEL
BUN: 12 mg/dL (ref 6–23)
CALCIUM: 9.9 mg/dL (ref 8.4–10.5)
CHLORIDE: 103 meq/L (ref 96–112)
CO2: 27 mEq/L (ref 19–32)
CREATININE: 0.81 mg/dL (ref 0.50–1.10)
Glucose, Bld: 121 mg/dL — ABNORMAL HIGH (ref 70–99)
POTASSIUM: 4.7 meq/L (ref 3.5–5.3)
SODIUM: 139 meq/L (ref 135–145)

## 2014-09-19 LAB — POCT GLYCOSYLATED HEMOGLOBIN (HGB A1C): HEMOGLOBIN A1C: 8.8

## 2014-09-19 LAB — LDL CHOLESTEROL, DIRECT: Direct LDL: 147 mg/dL — ABNORMAL HIGH

## 2014-09-19 MED ORDER — METFORMIN HCL 500 MG PO TABS: ORAL_TABLET | ORAL | Status: DC

## 2014-09-19 MED ORDER — LISINOPRIL 20 MG PO TABS: 20.0000 mg | ORAL_TABLET | Freq: Every day | ORAL | Status: DC

## 2014-09-19 NOTE — Progress Notes (Signed)
   Subjective:    Patient ID: Kaitlyn Cervantes, female    DOB: 1960-09-29, 54 y.o.   MRN: [id number]  HPI  Hypertension: Patient has had a diagnosis of hypertension since 2006. However she admits to not having good medical care for herself or over the last few years. She recently established with her office and has been taking her lisinopril 10 mg daily and amlodipine 10 mg daily. She denies chest pain, shortness of breath, palpitations, dyspnea, headache, dizziness or syncopal episodes.  She does not monitor her blood pressure at home. She has noticed some lower leg swelling since the start of amlodipine. She does watch the salt content in her diet. She gets minimal exercise.  Sleep apnea: Patient reports that she's had sleep apnea since 2005. At one point in time she was wearing his CPAP at night. She doesn't recall the last time she has worn her CPAP and states it's been years. She admits to snoring. She admits her family members say that she gasps for air on occasions during sleeping. She endorses daytime fatigue. She was ordered a sleep study on last visit, but she states she never made the appointment and would like to do so now.  Kidney stones: Patient reports a history of kidney stones in 2007. She states at that time she had undergone lithotripsy.  She has not had any issues with kidney stones since that time until approximately one month ago she was seen in the emergency room. She denies any dysuria, frequency or low back pain at this time. Does admit to occasional nausea and vomiting. She was  found to have an obstructing 8 mm ureterolithiasis with questionable hydronephrosis at that time in the ED. Renal function was normal. She was given return precautions and referred to urology, which she did not follow up. Patient had remained asymptomatic, until the past week or 2, where she started getting nausea and vomiting again. Her last vomit was on Monday, since then she has been able to take by mouth  well. She denies fever.  Constipation: Patient states she's had increasing constipation, has attempted to add fiber to her diet but does not have a regular bowel movement daily.  Health Maintenance: Patient is due for Pap. She declines flu shot today. She has no family history of breast or colon cancer.  Nonsmoker  Past Medical History  Diagnosis Date  . Diabetes mellitus without complication   . Hypertension   . Sleep apnea 2005  . Anemia   . Blood transfusion without reported diagnosis   . Kidney stones    Allergies  Allergen Reactions  . Iohexol      Code: HIVES, Desc: 13 hr premed     Review of Systems Per history of present illness    Objective:   Physical Exam BP 157/91 mmHg  Pulse 74  Temp(Src) 98.1 F (36.7 C) (Oral)  Ht 5\' 4"  (1.626 m)  Wt 227 lb 8 oz (103.193 kg)  BMI 39.03 kg/m2 Gen: NAD. Nontoxic in appearance, appears fatigued. Well-developed, well-nourished, obese. African-American female. HEENT: AT. [place]. Bilateral eyes without injections or icterus. MMM.  CV: RRR no murmurs appreciated Chest: CTAB, no wheeze or crackles Abd: Soft. Obese. NTND. BS present. No Masses palpated.  MSK: No CVA tenderness bilaterally Ext: No erythema. +1 edema bilateral lower extremities.      Assessment & Plan:

## 2014-09-19 NOTE — Patient Instructions (Signed)
It was a pleasure seeing you again today. You have a lot of issues going on, and we need to follow-up with you more closely. Want to see you in 2-4 weeks, at that time we will complete your Pap smear and evaluate your blood pressure. I want you taken amlodipine 10 mg, and lisinopril 20 mg a day. Follow-up with the nurse visit next week to have your blood pressure checked. I am placing a urology referral today and I want you to go see them as soon as possible to evaluate your kidney stone. I am placing a sleep study for you, they will call you with a time to schedule you for your study. Please have your colonoscopy completed as soon as possible

## 2014-09-20 LAB — TSH: TSH: 0.963 u[IU]/mL (ref 0.350–4.500)

## 2014-09-23 ENCOUNTER — Encounter: Payer: Self-pay | Admitting: Family Medicine

## 2014-09-23 NOTE — Assessment & Plan Note (Signed)
Patient with 55mm obstructing kidney stone (right). Patient had been referred from the ED to urology, however patient did not follow-up. I have placed in urgent referral for her today to urology, and strongly encouraged her to follow-up. He does not complain of any urinary symptoms for [place] today. Repeat of BMP, CBC today. Will not reobtain urine as it would not change her management, she needs to be seen by urology.

## 2014-09-23 NOTE — Assessment & Plan Note (Signed)
Study reordered today. Patient strongly encouraged to have her study completed as this could lead to hypertension as well.

## 2014-09-23 NOTE — Assessment & Plan Note (Addendum)
Blood pressure still mildly elevated today, increase lisinopril to 20 mg daily. May consider low-dose Lasix, if blood pressure still elevated at next appointment with lower extremity edema. Patient is to watch her salt content in her diet. Attempt to exercise 150 minutes a week. BMP, lipids, TSH, BMP, CBC and A1c today We'll follow-up in 2-4 weeks

## 2014-09-23 NOTE — Assessment & Plan Note (Signed)
Patient encouraged to use Mira lax, ED 40 g of fiber a day and drink plenty of fluids. TSH today. Follow-up as needed

## 2014-10-20 ENCOUNTER — Other Ambulatory Visit: Payer: Self-pay | Admitting: *Deleted

## 2014-10-20 DIAGNOSIS — E1165 Type 2 diabetes mellitus with hyperglycemia: Secondary | ICD-10-CM

## 2014-10-20 DIAGNOSIS — IMO0002 Reserved for concepts with insufficient information to code with codable children: Secondary | ICD-10-CM

## 2014-10-20 MED ORDER — LISINOPRIL 20 MG PO TABS: 20.0000 mg | ORAL_TABLET | Freq: Every day | ORAL | Status: DC

## 2014-10-20 MED ORDER — METFORMIN HCL 500 MG PO TABS
ORAL_TABLET | ORAL | Status: DC
Start: 2014-10-20 — End: 2015-08-17

## 2014-10-20 MED ORDER — AMLODIPINE BESYLATE 10 MG PO TABS: 10.0000 mg | ORAL_TABLET | Freq: Every day | ORAL | Status: DC

## 2014-10-27 ENCOUNTER — Ambulatory Visit: Payer: BC Managed Care – PPO | Admitting: Family Medicine

## 2014-11-06 ENCOUNTER — Ambulatory Visit (INDEPENDENT_AMBULATORY_CARE_PROVIDER_SITE_OTHER): Payer: BLUE CROSS/BLUE SHIELD | Admitting: *Deleted

## 2014-11-06 VITALS — BP 170/92 | HR 73

## 2014-11-06 DIAGNOSIS — I10 Essential (primary) hypertension: Secondary | ICD-10-CM

## 2014-11-06 DIAGNOSIS — Z136 Encounter for screening for cardiovascular disorders: Secondary | ICD-10-CM

## 2014-11-06 DIAGNOSIS — Z0130 Encounter for examination of blood pressure without abnormal findings: Secondary | ICD-10-CM

## 2014-11-06 NOTE — Progress Notes (Signed)
   Pt in nurse clinic for blood pressure check. BP 170/92 left arm manually, heart rate 93.  Pt stated she is having increase swelling in feet and ankles since increase of Lisinopril.  Pt denies any chest pain, SOB, dizziness, heartache, visual changes or numbness/tingling of extremities.  Pt decreased Lisinopril dosage to 10 mg; per patient since decreasing the medication swelling decreased.  BP reading from home 160s/90s.  BP recheck 160/88 right arm, manually.  Will forward to PCP.  Derl Barrow, RN

## 2014-11-07 ENCOUNTER — Telehealth: Payer: Self-pay | Admitting: Family Medicine

## 2014-11-07 NOTE — Telephone Encounter (Signed)
Called pt to discuss her BP not being at goal with her recent changes to her BP medication. Pt was seen at a nurse visit for BP recheck yesterday, and BP was 170/92. She is asymptomatic. She stated she decreased her lisinopril dose because she was experiencing swelling in her ankles. We discussed over the phone the amlodipine is the usual cause of LE swelling. I advised her to take the full dose lisinopril and come in for check up with a provider, so we can evaluate her leg swelling, recheck her BP on the 20 mg of lisinopril and repeat her BMP to check her kidney function since we started Ace-i. We discussed if she is still elevated above goal of 140/90 we will need to consider starting another agent. I asked her to make a SDA for Monday to accomplish the above.  Patient was not satisfied with conversation and wanted changes to be made/ new medication to be started over the phone. Howard Pouch DO PGY3 [facility]

## 2014-11-10 ENCOUNTER — Ambulatory Visit (INDEPENDENT_AMBULATORY_CARE_PROVIDER_SITE_OTHER): Payer: BLUE CROSS/BLUE SHIELD | Admitting: Family Medicine

## 2014-11-10 ENCOUNTER — Encounter: Payer: Self-pay | Admitting: Family Medicine

## 2014-11-10 DIAGNOSIS — I10 Essential (primary) hypertension: Secondary | ICD-10-CM | POA: Diagnosis not present

## 2014-11-10 LAB — BASIC METABOLIC PANEL
BUN: 9 mg/dL (ref 6–23)
CO2: 30 meq/L (ref 19–32)
Calcium: 9.3 mg/dL (ref 8.4–10.5)
Chloride: 103 mEq/L (ref 96–112)
Creat: 0.78 mg/dL (ref 0.50–1.10)
Glucose, Bld: 144 mg/dL — ABNORMAL HIGH (ref 70–99)
Potassium: 4 mEq/L (ref 3.5–5.3)
Sodium: 140 mEq/L (ref 135–145)

## 2014-11-10 MED ORDER — LISINOPRIL 40 MG PO TABS: 40.0000 mg | ORAL_TABLET | Freq: Every day | ORAL | Status: DC

## 2014-11-10 MED ORDER — AMLODIPINE BESYLATE 5 MG PO TABS: 5.0000 mg | ORAL_TABLET | Freq: Every day | ORAL | Status: DC

## 2014-11-10 NOTE — Patient Instructions (Signed)
It was a pleasure seeing you again today Kaitlyn Cervantes. I believe the swelling you were experiencing was from your amlodipine 10 mg dose. That is a known side effect for amlodipine at that dose in some people. I do not believe the swelling at from your lisinopril. I want to increase her lisinopril to 40 mg daily, and start your amlodipine at 5 mg daily.  Monitor your blood pressure one to 2 times a day for the next week, if your blood pressures are above 140/90 please call the office and let me know. If they are then we will need to discuss a different regimen. If you noticed some lower extremity swelling, try to put her feet up when you can, if it bothers you to provide you can stop the amlodipine altogether. However if your blood pressures are elevated without amlodipine use, we will need to discuss a different medication for you.

## 2014-11-10 NOTE — Progress Notes (Signed)
   Subjective:    Patient ID: Kaitlyn Cervantes, female    DOB: 06-12-1961, 54 y.o.   MRN: [id number]  HPI  Hypertension: Patient presents to same day appointment for elevated blood pressures on nurse visit last Friday despite hypertension medication. Patient was prescribed amlodipine 10 mg daily and lisinopril 20 mg daily. She experienced lower extremity edema, and after halved her dose of lisinopril. She states she restarted her lisinopril 20 mg daily 2 days ago. She reports she stopped her amlodipine about a week ago. Her lower extremity swelling has resolved. She reported having blood pressures 160/90 at home on both medications. Her blood pressure at nurse visit on Friday was 170/92, this was without lisinopril for dose and likely without amlodipine. Patient continues to watch the salt content in her diet, eating low-salt foods. She denies any headache, visual changes, chest pain, shortness of breath. She denies any lower extremity swelling today. She has not had her sleep study completed, patient has a history of sleep apnea which may be in sewing some of her elevated blood pressure.  Never smoker  Past Medical History  Diagnosis Date  . Diabetes mellitus without complication   . Hypertension   . Sleep apnea 2005  . Anemia   . Blood transfusion without reported diagnosis   . Kidney stones    Allergies  Allergen Reactions  . Iohexol      Code: HIVES, Desc: 13 hr premed     Review of Systems Per history of present illness    Objective:   Physical Exam BP 188/92 mmHg  Pulse 75  Ht 5\' 4"  (1.626 m)  Wt 215 lb (97.523 kg)  BMI 36.89 kg/m2 Gen: NAD. Nontoxic appearance, well-developed, well-nourished, African-American female, obese. HEENT: AT. [place]. Bilateral eyes without injections or icterus. MMM.  CV: RRR, 1/6 systolic murmur appreciated Chest: CTAB, no wheeze or crackles Abd: Soft.  NTND. BS present. No Masses palpated.  Ext: No erythema. No edema.       Assessment & Plan:

## 2014-11-10 NOTE — Assessment & Plan Note (Signed)
Patient is having elevated blood pressures at nurse's visit on Friday and at home. It is difficult to get a feel on what blood pressure medicine she's taken, and it does not sound like she took her blood pressure that often when she was on both amlodipine and lisinopril prescribed doses together. - Repeat BMP today to look at creatinine after initiation of ACE inhibitor. She did have a lab month ago, with normal creatinine, this should've been after lisinopril initiation, but difficult to figure out since she was taking intermittently. - Discussed side effect of amlodipine is lower extremity swelling, especially at the 10 mg dose. For this reason we have decided together, that she would be more comfortable using the amlodipine 5 mg dose daily along with an increased dose of lisinopril to 40 mg, monitoring her blood pressure at home with the goal below 140/90. Patient does not want to use HCTZ if at all possible secondary to a scare she had in the past with pharmacy mixing up her medications. - Patient was allowed the option to decide today if she wanted to start HCTZ or restart amlodipine at half dose, and she decided on amlodipine at 5 mg. Patient is to monitor for any lower extremity swelling, and if she is uncomfortable she can stop the amlodipine if she experiences swelling. - She is to monitor her blood pressure over the next week 1-2 times a day, and call the office if she has blood pressures above 140/90. She takes both medications, and has blood pressures above 140/90 we would have to consider possibly clonidine or possibly chlorothiazide if she is willing. Would not prefer clonidine over a thiazide considering she has compliance issues and stops medications abruptly at times. - Follow-up phone call initiated by patient in one week if blood pressures are above 140/90 or she's having lower extremity swelling otherwise we will leave the medications are amlodipine 5 mg daily and lisinopril 40 mg daily.

## 2014-11-11 ENCOUNTER — Telehealth: Payer: Self-pay | Admitting: Family Medicine

## 2014-11-11 ENCOUNTER — Encounter: Payer: Self-pay | Admitting: Family Medicine

## 2014-11-11 NOTE — Telephone Encounter (Signed)
Unable to leave message for patient.  Will mail letter with normal results. Jazmin Hartsell,CMA

## 2014-11-11 NOTE — Telephone Encounter (Signed)
Please Call pt, her lab work yesterday looked great. Kidney function is excellent. Her glucose was elevated at 144, but otherwise completely normal lab work. I will send her copy as well. Thanks.

## 2014-12-04 ENCOUNTER — Encounter: Payer: Self-pay | Admitting: Family Medicine

## 2014-12-04 ENCOUNTER — Ambulatory Visit (INDEPENDENT_AMBULATORY_CARE_PROVIDER_SITE_OTHER): Payer: BLUE CROSS/BLUE SHIELD | Admitting: Family Medicine

## 2014-12-04 VITALS — BP 175/83 | HR 74 | Temp 98.3°F | Ht 64.0 in | Wt 223.2 lb

## 2014-12-04 DIAGNOSIS — W57XXXA Bitten or stung by nonvenomous insect and other nonvenomous arthropods, initial encounter: Secondary | ICD-10-CM | POA: Diagnosis not present

## 2014-12-04 DIAGNOSIS — I10 Essential (primary) hypertension: Secondary | ICD-10-CM | POA: Diagnosis not present

## 2014-12-04 DIAGNOSIS — S30860A Insect bite (nonvenomous) of lower back and pelvis, initial encounter: Secondary | ICD-10-CM

## 2014-12-04 MED ORDER — DOXYCYCLINE HYCLATE 100 MG PO TABS: 100.0000 mg | ORAL_TABLET | Freq: Two times a day (BID) | ORAL | Status: DC

## 2014-12-04 NOTE — Patient Instructions (Addendum)
Tick Bite Information Ticks are insects that attach themselves to the skin and draw blood for food. There are various types of ticks. Common types include wood ticks and deer ticks. Most ticks live in shrubs and grassy areas. Ticks can climb onto your body when you make contact with leaves or grass where the tick is waiting. The most common places on the body for ticks to attach themselves are the scalp, neck, armpits, waist, and groin. Most tick bites are harmless, but sometimes ticks carry germs that cause diseases. These germs can be spread to a person during the tick's feeding process. The chance of a disease spreading through a tick bite depends on:   The type of tick.  Time of year.   How long the tick is attached.   Geographic location.  HOW CAN YOU PREVENT TICK BITES? Take these steps to help prevent tick bites when you are outdoors:  Wear protective clothing. Long sleeves and long pants are best.   Wear white clothes so you can see ticks more easily.  Tuck your pant legs into your socks.   If walking on a trail, stay in the middle of the trail to avoid brushing against bushes.  Avoid walking through areas with long grass.  Put insect repellent on all exposed skin and along boot tops, pant legs, and sleeve cuffs.   Check clothing, hair, and skin repeatedly and before going inside.   Brush off any ticks that are not attached.  Take a shower or bath as soon as possible after being outdoors.  WHAT IS THE PROPER WAY TO REMOVE A TICK? Ticks should be removed as soon as possible to help prevent diseases caused by tick bites. 1. If latex gloves are available, put them on before trying to remove a tick.  2. Using fine-point tweezers, grasp the tick as close to the skin as possible. You may also use curved forceps or a tick removal tool. Grasp the tick as close to its head as possible. Avoid grasping the tick on its body. 3. Pull gently with steady upward pressure until  the tick lets go. Do not twist the tick or jerk it suddenly. This may break off the tick's head or mouth parts. 4. Do not squeeze or crush the tick's body. This could force disease-carrying fluids from the tick into your body.  5. After the tick is removed, wash the bite area and your hands with soap and water or other disinfectant such as alcohol. 6. Apply a small amount of antiseptic cream or ointment to the bite site.  7. Wash and disinfect any instruments that were used.  Do not try to remove a tick by applying a hot match, petroleum jelly, or fingernail polish to the tick. These methods do not work and may increase the chances of disease being spread from the tick bite.  WHEN SHOULD YOU SEEK MEDICAL CARE? Contact your health care provider if you are unable to remove a tick from your skin or if a part of the tick breaks off and is stuck in the skin.  After a tick bite, you need to be aware of signs and symptoms that could be related to diseases spread by ticks. Contact your health care provider if you develop any of the following in the days or weeks after the tick bite:  Unexplained fever.  Rash. A circular rash that appears days or weeks after the tick bite may indicate the possibility of Lyme disease. The rash may resemble   a target with a bull's-eye and may occur at a different part of your body than the tick bite.  Redness and swelling in the area of the tick bite.   Tender, swollen lymph glands.   Diarrhea.   Weight loss.   Cough.   Fatigue.   Muscle, joint, or bone pain.   Abdominal pain.   Headache.   Lethargy or a change in your level of consciousness.  Difficulty walking or moving your legs.   Numbness in the legs.   Paralysis.  Shortness of breath.   Confusion.   Repeated vomiting.  Document Released: 06/24/2000 Document Revised: 04/17/2013 Document Reviewed: 12/05/2012 [facility] Patient Information 2015 [place], [place]. This information is  not intended to replace advice given to you by your health care provider. Make sure you discuss any questions you have with your health care provider.  Follow in 2 weeks if symptoms do not resolve Since your husband seems to bring home ticks, you both should perform skin checks every other day

## 2014-12-04 NOTE — Assessment & Plan Note (Signed)
Patient with multiple tick bites, do not appear infected, no obvious rash around bites were other locations on her body. She is afebrile as well, but with fatigue and intermittent headache. Discussed in great detail the futility of feeling skin checks every 24-36 hours with her and her husband since he seems to be the person bringing in the ticks in  from the outdoors. She is to check her sheets in bed for any ticks. Doxycycline 100 mg twice a day 14 days. AVS from 2 different sites on tick bites, Lyme disease, [place] spotted fever etc. Patient to follow-up in 3 weeks.

## 2014-12-04 NOTE — Progress Notes (Signed)
   Subjective:    Patient ID: Kaitlyn Cervantes, female    DOB: December 16, 1960, 54 y.o.   MRN: [id number]  HPI  Tick bites: Patient r eports multiple tick bites over the last 4 weeks. She points to 4 different locations on her trunk where she states he has found a tick and removed them. She believes the tics are coming in on her husband, who is in the garden frequently. She states the areas of tick bites are swollen, and used to be red. She reports the tics are brown, some of them had white spots or lines on the back. She denies any fever or chills. Nausea, vomiting, diarrhea, myalgias or arthralgias. She does admit to fatigue, pruritic rash around take bites, and intermittent headache. She feels one tic was on for at least a few weeks. She states at first she thought it was a mole, until she investigated further. She denies a rash anywhere else on her body.  HTN: Patient's blood pressure elevated again today, she states she has taken her lisinopril 40 mg but is having the amlodipine to 5 mg. She is asking today if she should just go ahead and use amlodipine 10 mg as she was prior, prior to her complaining of lower extremity edema. She denies any chest pain, shortness of breath, palpitations or visual changes. Goal <140/90, Diabetic.   Never smoker Past Medical History  Diagnosis Date  . Diabetes mellitus without complication   . Hypertension   . Sleep apnea 2005  . Anemia   . Blood transfusion without reported diagnosis   . Kidney stones    Allergies  Allergen Reactions  . Iohexol      Code: HIVES, Desc: 13 hr premed    Past Surgical History  Procedure Laterality Date  . Kidney stones  2007  . Cesarean section    . Splenectomy     Review of Systems Per HPI    Objective:   Physical Exam BP 175/83 mmHg  Pulse 74  Temp(Src) 98.3 F (36.8 C) (Oral)  Ht 5\' 4"  (1.626 m)  Wt 223 lb 3.2 oz (101.243 kg)  BMI 38.29 kg/m2 Gen: NAD. Nontoxic in appearance, appears fatigued. Well-developed,  well-nourished African-American female. HEENT: AT. [place].  Bilateral eyes without injections, drainage or icterus. MMM.  Neck, supple, no lymphadenopathy. CV: RRR  Chest: CTAB, no wheeze or crackles. Normal work of breath Ext: No erythema. No edema.  Skin: For well-healed, hyperpigmented sites of prior tick bites. Under bra strap on her back, right flank, back, posterior medial thigh. No erythema, no rash, no swelling, nontender, no drainage. Neuro: Alert, oriented, [place], EOMI, cranial nerves II through XII intact, no focal deficits.     Assessment & Plan:

## 2014-12-04 NOTE — Assessment & Plan Note (Signed)
Patient willing to try amlodipine 10 mg again, despite the prior history of bilateral lower extremity edema. Restart amlodipine 10 mg today, continue lisinopril 40 mg. Patient again was educated on normal blood pressure for her age and chronic issues is below 140/90. Patient is supposed to call if her blood pressure remains above the skull with the addition of amlodipine from 5-10 mg. Patient is to follow-up in 2-3 weeks even if blood pressures are normal.

## 2015-04-24 ENCOUNTER — Other Ambulatory Visit: Payer: Self-pay | Admitting: Family Medicine

## 2015-06-17 ENCOUNTER — Other Ambulatory Visit: Payer: Self-pay | Admitting: Family Medicine

## 2015-06-22 ENCOUNTER — Other Ambulatory Visit: Payer: Self-pay | Admitting: Oncology

## 2015-08-13 ENCOUNTER — Ambulatory Visit: Payer: BLUE CROSS/BLUE SHIELD | Admitting: Student

## 2015-08-17 ENCOUNTER — Ambulatory Visit: Payer: BLUE CROSS/BLUE SHIELD | Admitting: Family Medicine

## 2015-08-17 ENCOUNTER — Ambulatory Visit (INDEPENDENT_AMBULATORY_CARE_PROVIDER_SITE_OTHER): Payer: Commercial Managed Care - HMO | Admitting: Family Medicine

## 2015-08-17 ENCOUNTER — Encounter: Payer: Self-pay | Admitting: Family Medicine

## 2015-08-17 VITALS — BP 197/85 | HR 95 | Temp 98.8°F | Ht 64.0 in | Wt 228.6 lb

## 2015-08-17 DIAGNOSIS — R319 Hematuria, unspecified: Secondary | ICD-10-CM

## 2015-08-17 DIAGNOSIS — E1165 Type 2 diabetes mellitus with hyperglycemia: Secondary | ICD-10-CM

## 2015-08-17 DIAGNOSIS — N3001 Acute cystitis with hematuria: Secondary | ICD-10-CM

## 2015-08-17 DIAGNOSIS — I10 Essential (primary) hypertension: Secondary | ICD-10-CM | POA: Diagnosis not present

## 2015-08-17 DIAGNOSIS — IMO0001 Reserved for inherently not codable concepts without codable children: Secondary | ICD-10-CM

## 2015-08-17 LAB — POCT URINALYSIS DIPSTICK
Bilirubin, UA: NEGATIVE
Glucose, UA: NEGATIVE
KETONES UA: NEGATIVE
NITRITE UA: NEGATIVE
PH UA: 7
PROTEIN UA: 30
Spec Grav, UA: 1.015
UROBILINOGEN UA: 1

## 2015-08-17 LAB — POCT UA - MICROSCOPIC ONLY

## 2015-08-17 MED ORDER — SULFAMETHOXAZOLE-TRIMETHOPRIM 800-160 MG PO TABS: 1.0000 | ORAL_TABLET | Freq: Two times a day (BID) | ORAL | Status: DC

## 2015-08-17 MED ORDER — LISINOPRIL 40 MG PO TABS: 40.0000 mg | ORAL_TABLET | Freq: Every day | ORAL | Status: DC

## 2015-08-17 MED ORDER — METFORMIN HCL 500 MG PO TABS: ORAL_TABLET | ORAL | Status: DC

## 2015-08-17 MED ORDER — AMLODIPINE BESYLATE 5 MG PO TABS: 5.0000 mg | ORAL_TABLET | Freq: Every day | ORAL | Status: DC

## 2015-08-17 NOTE — Progress Notes (Signed)
Subjective:   Kaitlyn Cervantes is a 55 y.o. female with a history of Type 2 DM, HTN, OSA presenting with urinary sx.   Here for  Chief Complaint  Patient presents with  . urine problem   #Hematuria: Reports this has been occuring for ~3 weeks on and off. Denies fever, chills,  nausea, vomiting. Reports occasional left side pain during this time but not constant or worsening  #T2DM: not taking medications. No checking blood glucose. Not UTD on screenings.   #HTN: not taking medications. No HA, blurry vision, chest pain, edema.   #Grief: Husband of 59 years died on 1/3. 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   #HCM: Health Maintenance Due  Topic Date Due  . Hepatitis C Screening  1961-07-08  . PNEUMOCOCCAL POLYSACCHARIDE VACCINE (1) 03/24/1962  . FOOT EXAM  03/24/1970  . OPHTHALMOLOGY EXAM  03/24/1970  . HIV Screening  03/25/1975  . COLONOSCOPY  03/24/2010  . HEMOGLOBIN A1C  03/22/2015   Review of Systems:   Per HPI. All other systems reviewed and are negative expect as in HPI   PMH, PSH, Medications, Allergies, SocialHx and FHx reviewed and updated in EMR- marked as reviewed 08/17/2015   Objective:  BP 197/85 mmHg  Pulse 95  Temp(Src) 98.8 F (37.1 C) (Oral)  Ht 5\' 4"  (1.626 m)  Wt 228 lb 9.6 oz (103.692 kg)  BMI 39.22 kg/m2  Gen:  55 y.o. female in NAD. Speaking in full sentences. Good eye contact. Non toxic appearing.  HEENT: NCAT, MMM, EOMI, PERRL, anicteric sclerae.  CV: RRR, no MRG, no JVD Resp: Normal WOB. Non-labored, CTAB, no wheezes noted GI: Soft, ND, BS present. noTTP suprapubic. no guarding or organomegaly. No CVA tenderness.  Ext: WWP, no edema MSK: Intact gait. Full ROM Neuro: Alert and oriented, speech normal Pysch: Normal mood and affect.       Chemistry      Component Value Date/Time   NA 140 11/10/2014 1644   K 4.0 11/10/2014 1644   CL 103 11/10/2014 1644   CO2 30 11/10/2014  1644   BUN 9 11/10/2014 1644   CREATININE 0.78 11/10/2014 1644   CREATININE 1.09 08/22/2014 1936      Component Value Date/Time   CALCIUM 9.3 11/10/2014 1644   ALKPHOS 114 08/22/2014 1936   AST 21 08/22/2014 1936   ALT 17 08/22/2014 1936   BILITOT 0.4 08/22/2014 1936      Lab Results  Component Value Date   HGBA1C 8.8 09/19/2014   Assessment:     Kaitlyn Cervantes is a 55 y.o. female here for hematuria with uncontrolled HTN and T2DM    Plan:   1. Hematuria: UA consistent with . Complicated given [id number] -Urinalysis Dipstick -Urine culture sent, follow up sensitivities -Bactrim DS sent to pharmacy  2. Uncontrolled type 2 diabetes mellitus without complication, without long-term current use of insulin (HCC)  poorly controlled/uncontrolled per last HA1C. - Refilled metformin. - metFORMIN (GLUCOPHAGE) 500 MG tablet; Take 500 mg twice a day for 1 week, then take 1000 mg twice a day until you see me.  Dispense: 180 tablet; Refill: 3 -RTC in 2 weeks for routine DM screenings.   3. Essential hypertension, benign: currently off medications and uncontrolled. - Refilled medications today. RTC in 2 weeks for BP check  4. HCM: needs routine screening, most importantly colon cancer screening  5. Grief: provided support and  discussed normal grieving and to return to care if she is unable to function due to grief.   Future Appointments Date Time Provider [place]  08/31/2015 1:30 PM Rosemarie Ax, MD [facility]    Caren Macadam, MD, ABFM [facility] Fellow, Faculty Practice 08/17/2015  3:42 PM

## 2015-08-17 NOTE — Patient Instructions (Signed)

## 2015-08-18 ENCOUNTER — Telehealth: Payer: Self-pay | Admitting: Family Medicine

## 2015-08-18 DIAGNOSIS — N3001 Acute cystitis with hematuria: Secondary | ICD-10-CM

## 2015-08-18 DIAGNOSIS — R319 Hematuria, unspecified: Secondary | ICD-10-CM

## 2015-08-18 MED ORDER — SULFAMETHOXAZOLE-TRIMETHOPRIM 800-160 MG PO TABS: 1.0000 | ORAL_TABLET | Freq: Two times a day (BID) | ORAL | Status: AC

## 2015-08-18 NOTE — Telephone Encounter (Signed)
Pt called because she was seen by Dr. Ernestina Patches yesterday and was prescribed some antibiotics. We called all her medications in to her online pharmacy. Can we send the antibiotics to [facility] instead. jw

## 2015-08-18 NOTE — Telephone Encounter (Signed)
LM for patient that script was resent to local pharmacy. Jazmin Hartsell,CMA

## 2015-08-20 ENCOUNTER — Telehealth: Payer: Self-pay | Admitting: *Deleted

## 2015-08-20 LAB — URINE CULTURE: Colony Count: >=100000

## 2015-08-20 NOTE — Telephone Encounter (Signed)
Spoke with patient and she is aware of results.  States that she hadn't started taking this medication yet because it was originally sent to her mail order.  It has since been sent to her local pharmacy and patient plans to pick this up today and start taking it.  Jazmin Hartsell,CMA

## 2015-08-20 NOTE — Telephone Encounter (Signed)
-----   Message from Caren Macadam, MD sent at 08/20/2015  2:41 PM EST ----- Please call and let patient know that her urine showed she has a urinary infection and that the bacteria is sensitive to the medication I sent in on Monday. If her symptoms doe not resolve or worsen she needs to be seen immediately.  ~Dr. Ernestina Patches

## 2015-08-20 NOTE — Telephone Encounter (Signed)
LM for patient to call back. Kaitlyn Cervantes,CMA  

## 2015-08-31 ENCOUNTER — Encounter: Payer: Self-pay | Admitting: Family Medicine

## 2015-08-31 ENCOUNTER — Ambulatory Visit (INDEPENDENT_AMBULATORY_CARE_PROVIDER_SITE_OTHER): Payer: Commercial Managed Care - HMO | Admitting: Family Medicine

## 2015-08-31 VITALS — BP 170/80 | HR 88 | Temp 98.3°F | Ht 64.0 in | Wt 233.1 lb

## 2015-08-31 DIAGNOSIS — Z1159 Encounter for screening for other viral diseases: Secondary | ICD-10-CM | POA: Diagnosis not present

## 2015-08-31 DIAGNOSIS — E118 Type 2 diabetes mellitus with unspecified complications: Secondary | ICD-10-CM

## 2015-08-31 DIAGNOSIS — IMO0002 Reserved for concepts with insufficient information to code with codable children: Secondary | ICD-10-CM

## 2015-08-31 DIAGNOSIS — Z1211 Encounter for screening for malignant neoplasm of colon: Secondary | ICD-10-CM

## 2015-08-31 DIAGNOSIS — R319 Hematuria, unspecified: Secondary | ICD-10-CM | POA: Insufficient documentation

## 2015-08-31 DIAGNOSIS — E1165 Type 2 diabetes mellitus with hyperglycemia: Secondary | ICD-10-CM | POA: Diagnosis not present

## 2015-08-31 DIAGNOSIS — Z114 Encounter for screening for human immunodeficiency virus [HIV]: Secondary | ICD-10-CM

## 2015-08-31 DIAGNOSIS — I10 Essential (primary) hypertension: Secondary | ICD-10-CM

## 2015-08-31 LAB — POCT URINALYSIS DIPSTICK
Bilirubin, UA: NEGATIVE
Glucose, UA: NEGATIVE
KETONES UA: NEGATIVE
NITRITE UA: NEGATIVE
PH UA: 6
PROTEIN UA: 30
Spec Grav, UA: 1.025
UROBILINOGEN UA: 1

## 2015-08-31 LAB — POCT UA - MICROSCOPIC ONLY

## 2015-08-31 LAB — POCT GLYCOSYLATED HEMOGLOBIN (HGB A1C): Hemoglobin A1C: 7.3

## 2015-08-31 LAB — HEPATITIS C ANTIBODY: HCV AB: NEGATIVE

## 2015-08-31 MED ORDER — HYDROCHLOROTHIAZIDE 25 MG PO TABS: 25.0000 mg | ORAL_TABLET | Freq: Every day | ORAL | Status: DC

## 2015-08-31 NOTE — Progress Notes (Signed)
   Subjective:    Kaitlyn Cervantes - 55 y.o. female MRN [id number]  Date of birth: May 27, 1961  CC Dm2, HTN  HPI  Kaitlyn Cervantes is here for HTN and Dm2.   CHRONIC DIABETES Disease Monitoring  Blood Sugar Ranges: n/a  Polyuria: no   Visual problems: no   Medication Compliance: yes  Medication Side Effects  Hypoglycemia: no   Preventitive Health Care  Eye Exam: needs referral   Foot Exam: today   Diet pattern: does not watch what she eats since she had to take care of her husband. Her recently passed in January.   Exercise:   HTN Disease Monitoring: Home BP Monitoring none  Medications: lisinopril, amlodipine,  Chest pain- no     Dyspnea- no Compliance-  Intermittent .  Lightheadedness-  no   Edema- reports swelling with amlodipine   Hematuria:  She was seen in January and grew proteus in her urine culture and was treated with antibiotics. .  She saw her urologist in April 2016.  Has not seen them since this appointment. She was suppose to have a 24 hour urine done but this was not completed.   Noticed to have a Kidney stones on CT 2016.  Ct ab/pelvis 1. Acute obstructive uropathy on the right with an obstructing 8 mm calculus just distal to the right UPJ. Probable forniceal rupture. 2. Bulky right nephrolithiasis up to 18 mm. Smaller left nephrolithiasis. 3. Cholelithiasis. 4. Cardiomegaly. She denies any dysuria   PMH: Hydronephrosis and nephrolithiasis, obstructive sleep apnea SH: Never smoker  Health Maintenance:  - foot exam today  - Hep c and HIV today  - A1c Today  - Referral placed to gastroenterology and ophthalmology Health Maintenance Due  Topic Date Due  . Hepatitis C Screening  08/17/1960  . PNEUMOCOCCAL POLYSACCHARIDE VACCINE (1) 03/24/1962  . FOOT EXAM  03/24/1970  . OPHTHALMOLOGY EXAM  03/24/1970  . HIV Screening  03/25/1975  . COLONOSCOPY  03/24/2010  . HEMOGLOBIN A1C  03/22/2015    Review of Systems See HPI     Objective:   Physical  Exam BP 170/80 mmHg  Pulse 88  Temp(Src) 98.3 F (36.8 C) (Oral)  Ht 5\' 4"  (1.626 m)  Wt 233 lb 1.6 oz (105.733 kg)  BMI 39.99 kg/m2 Gen: NAD, alert, cooperative with exam, obese  HEENT: NCAT,  CV: RRR, good S1/S2, no murmur, no edema, capillary refill brisk  Resp: CTABL, no wheezes, non-labored MSK: no CVA tenderness  Skin: no rashes, normal turgor  Neuro: no gross deficits.  Psych: alert and oriented     Assessment & Plan:   Essential hypertension, benign Uncontrolled - Advised diet and exercise - Continue lisinopril and amlodipine - started HCTZ 25 mg  - follow up in 2-4 weeks.   Type 2 diabetes mellitus, uncontrolled ([place]) Near controlled. A1c with much improvement  - Encourage diet and exercise - Continue current medications  Hematuria History of nephrolithiasis as well as a recent urine culture growing Proteus area She was treated with antibiotics but feels like her urine has not cleared in color. Has not been seen by urology since last April. Only has occasional back pain but nothing extravagant - Urine dipstick and microscope today - Advised that she call to follow-up with urology

## 2015-08-31 NOTE — Assessment & Plan Note (Signed)
Uncontrolled - Advised diet and exercise - Continue lisinopril and amlodipine - started HCTZ 25 mg  - follow up in 2-4 weeks.

## 2015-08-31 NOTE — Assessment & Plan Note (Signed)
History of nephrolithiasis as well as a recent urine culture growing Proteus area She was treated with antibiotics but feels like her urine has not cleared in color. Has not been seen by urology since last April. Only has occasional back pain but nothing extravagant - Urine dipstick and microscope today - Advised that she call to follow-up with urology

## 2015-08-31 NOTE — Addendum Note (Signed)
Addended by: Rosemarie Ax on: 08/31/2015 05:11 PM   Modules accepted: SmartSet

## 2015-08-31 NOTE — Patient Instructions (Signed)
Thank you for coming in,   I will call or send a letter with the results from today.   I have sent in a blood pressure medication called hydrochlorothiazide  I have referred to the eye doctor and the gastroenterologist.  Please bring all of your medications with you to each visit.   Sign up for My Chart to have easy access to your labs results, and communication with your Primary care physician   Please feel free to call with any questions or concerns at any time, at [phone]. --Dr. Raeford Razor  Diet Recommendations for Diabetes   Starchy (carb) foods include: Bread, rice, pasta, potatoes, corn, crackers, bagels, muffins, all baked goods.   Protein foods include: Meat, fish, poultry, eggs, dairy foods, and beans such as pinto and kidney beans (beans also provide carbohydrate).   1. Eat at least 3 meals and 1-2 snacks per day. Never go more than 4-5 hours while awake without eating.  2. Limit starchy foods to TWO per meal and ONE per snack. ONE portion of a starchy  food is equal to the following:   - ONE slice of bread (or its equivalent, such as half of a hamburger bun).   - 1/2 cup of a "scoopable" starchy food such as potatoes or rice.   - 1 OUNCE (28 grams) of starchy snack foods such as crackers or pretzels (look on label).   - 15 grams of carbohydrate as shown on food label.  3. Both lunch and dinner should include a protein food, a carb food, and vegetables.   - Obtain twice as many veg's as protein or carbohydrate foods for both lunch and dinner.   - Try to keep frozen veg's on hand for a quick vegetable serving.     - Fresh or frozen veg's are best.  4. Breakfast should always include protein.

## 2015-08-31 NOTE — Assessment & Plan Note (Signed)
Near controlled. A1c with much improvement  - Encourage diet and exercise - Continue current medications

## 2015-09-01 LAB — HIV ANTIBODY (ROUTINE TESTING W REFLEX): HIV: NONREACTIVE

## 2015-09-01 LAB — LIPID PANEL
Cholesterol: 201 mg/dL — ABNORMAL HIGH (ref 125–200)
HDL: 44 mg/dL — ABNORMAL LOW (ref >=46)
LDL CALC: 133 mg/dL — AB (ref <130)
TRIGLYCERIDES: 121 mg/dL (ref <150)
Total CHOL/HDL Ratio: 4.6 Ratio (ref <=5.0)
VLDL: 24 mg/dL (ref <30)

## 2015-09-01 LAB — BASIC METABOLIC PANEL WITH GFR
BUN: 13 mg/dL (ref 7–25)
CO2: 28 mmol/L (ref 20–31)
Calcium: 9.3 mg/dL (ref 8.6–10.4)
Chloride: 102 mmol/L (ref 98–110)
Creat: 0.98 mg/dL (ref 0.50–1.05)
GFR, EST AFRICAN AMERICAN: 75 mL/min (ref >=60)
GFR, Est Non African American: 65 mL/min (ref >=60)
Glucose, Bld: 169 mg/dL — ABNORMAL HIGH (ref 65–99)
POTASSIUM: 4.4 mmol/L (ref 3.5–5.3)
SODIUM: 139 mmol/L (ref 135–146)

## 2015-09-08 ENCOUNTER — Telehealth: Payer: Self-pay | Admitting: Family Medicine

## 2015-09-08 NOTE — Telephone Encounter (Signed)
Please call and leave message regarding lab results

## 2015-09-09 NOTE — Telephone Encounter (Signed)
Left VM for patient informing her of her results.   If any questions then please take the best time and phone number to call and I will try to call her back.   Rosemarie Ax, MD PGY-3, [place] [facility] 09/09/2015, 5:16 PM

## 2015-09-21 ENCOUNTER — Other Ambulatory Visit: Payer: Self-pay

## 2015-09-21 DIAGNOSIS — N6310 Unspecified lump in the right breast, unspecified quadrant: Secondary | ICD-10-CM

## 2015-09-29 ENCOUNTER — Encounter: Payer: Self-pay | Admitting: Internal Medicine

## 2015-10-05 ENCOUNTER — Telehealth: Payer: Self-pay | Admitting: Family Medicine

## 2015-10-05 DIAGNOSIS — N6310 Unspecified lump in the right breast, unspecified quadrant: Secondary | ICD-10-CM

## 2015-10-05 NOTE — Telephone Encounter (Signed)
Received phone call from [facility] that she has a right breast mass. Will order diagnostic ultrasound and mammogram and fax orders over.   Rosemarie Ax, MD PGY-3, [place] [facility] 10/05/2015, 2:12 PM FPTS Service pager: [phone] (text pages welcome through [facility])

## 2015-10-10 ENCOUNTER — Other Ambulatory Visit: Payer: Self-pay | Admitting: Family Medicine

## 2015-10-16 ENCOUNTER — Telehealth: Payer: Self-pay | Admitting: *Deleted

## 2015-10-16 NOTE — Telephone Encounter (Signed)
Left vm for pt to return call regarding [place] appt for 10/21/15. Contact information provided.

## 2015-10-19 ENCOUNTER — Encounter: Payer: Self-pay | Admitting: *Deleted

## 2015-10-19 ENCOUNTER — Telehealth: Payer: Self-pay | Admitting: *Deleted

## 2015-10-19 DIAGNOSIS — C50211 Malignant neoplasm of upper-inner quadrant of right female breast: Secondary | ICD-10-CM | POA: Insufficient documentation

## 2015-10-19 HISTORY — DX: Malignant neoplasm of upper-inner quadrant of right female breast: C50.211

## 2015-10-19 NOTE — Telephone Encounter (Signed)
Confirmed [facility] for 10/21/15 at 1215.  Instructions and contact information given.

## 2015-10-21 ENCOUNTER — Encounter: Payer: Self-pay | Admitting: Oncology

## 2015-10-21 ENCOUNTER — Other Ambulatory Visit: Payer: Self-pay | Admitting: General Surgery

## 2015-10-21 ENCOUNTER — Encounter: Payer: Self-pay | Admitting: Nurse Practitioner

## 2015-10-21 ENCOUNTER — Other Ambulatory Visit (HOSPITAL_BASED_OUTPATIENT_CLINIC_OR_DEPARTMENT_OTHER): Payer: Commercial Managed Care - HMO

## 2015-10-21 ENCOUNTER — Ambulatory Visit (HOSPITAL_BASED_OUTPATIENT_CLINIC_OR_DEPARTMENT_OTHER): Payer: Commercial Managed Care - HMO | Admitting: Oncology

## 2015-10-21 ENCOUNTER — Ambulatory Visit
Admission: RE | Admit: 2015-10-21 | Discharge: 2015-10-21 | Disposition: A | Discharge status: Home / Self Care | Payer: Commercial Managed Care - HMO | Source: Ambulatory Visit | Attending: Radiation Oncology | Admitting: Radiation Oncology

## 2015-10-21 VITALS — BP 175/84 | HR 85 | Temp 98.0°F | Resp 18 | Ht 64.0 in | Wt 231.8 lb

## 2015-10-21 DIAGNOSIS — C50211 Malignant neoplasm of upper-inner quadrant of right female breast: Secondary | ICD-10-CM

## 2015-10-21 DIAGNOSIS — Z170 Estrogen receptor positive status [ER+]: Secondary | ICD-10-CM | POA: Diagnosis not present

## 2015-10-21 LAB — COMPREHENSIVE METABOLIC PANEL
ALT: 12 U/L (ref 0–55)
ANION GAP: 8 meq/L (ref 3–11)
AST: 14 U/L (ref 5–34)
Albumin: 3.6 g/dL (ref 3.5–5.0)
Alkaline Phosphatase: 90 U/L (ref 40–150)
BUN: 9.9 mg/dL (ref 7.0–26.0)
CALCIUM: 9.6 mg/dL (ref 8.4–10.4)
CHLORIDE: 107 meq/L (ref 98–109)
CO2: 26 mEq/L (ref 22–29)
Creatinine: 0.9 mg/dL (ref 0.6–1.1)
EGFR: 85 mL/min/{1.73_m2} — AB (ref >90)
Glucose: 139 mg/dl (ref 70–140)
POTASSIUM: 4.4 meq/L (ref 3.5–5.1)
Sodium: 140 mEq/L (ref 136–145)
Total Bilirubin: 0.3 mg/dL (ref 0.20–1.20)
Total Protein: 7.6 g/dL (ref 6.4–8.3)

## 2015-10-21 LAB — CBC WITH DIFFERENTIAL/PLATELET
BASO%: 0.7 % (ref 0.0–2.0)
BASOS ABS: 0 10*3/uL (ref 0.0–0.1)
EOS%: 1.8 % (ref 0.0–7.0)
Eosinophils Absolute: 0.1 10*3/uL (ref 0.0–0.5)
HEMATOCRIT: 40.1 % (ref 34.8–46.6)
HGB: 12.7 g/dL (ref 11.6–15.9)
LYMPH#: 2.2 10*3/uL (ref 0.9–3.3)
LYMPH%: 42.9 % (ref 14.0–49.7)
MCH: 24.9 pg — AB (ref 25.1–34.0)
MCHC: 31.6 g/dL (ref 31.5–36.0)
MCV: 78.8 fL — AB (ref 79.5–101.0)
MONO#: 0.5 10*3/uL (ref 0.1–0.9)
MONO%: 9.3 % (ref 0.0–14.0)
NEUT#: 2.3 10*3/uL (ref 1.5–6.5)
NEUT%: 45.3 % (ref 38.4–76.8)
Platelets: 284 10*3/uL (ref 145–400)
RBC: 5.09 10*6/uL (ref 3.70–5.45)
RDW: 15.4 % — ABNORMAL HIGH (ref 11.2–14.5)
WBC: 5.1 10*3/uL (ref 3.9–10.3)

## 2015-10-21 NOTE — Progress Notes (Signed)
[place]  Telephone:([phone]) [phone] Fax:(336) [phone]     ID: Kaitlyn Cervantes DOB: [birth year]  MR#: [id number]  ZJI#:[id number]  Patient Care Team: Rosemarie Ax, MD as PCP - General (Family Medicine) Chauncey Cruel, MD as Consulting Physician (Oncology) Autumn Messing III, MD as Consulting Physician (General Surgery) Gery Pray, MD as Consulting Physician (Radiation Oncology) PCP: Clearance Coots, MD GYN: OTHER MD:  CHIEF COMPLAINT:  Triple negative breast cancer  CURRENT TREATMENT: Awaiting neoadjuvant treatment   BREAST CANCER HISTORY: Kaitlyn Cervantes had been aware of changes in her right breast as early as November 2016 but she was entirely devoted to the care of her husband, who was significantly disabled and died from a myocardial infarction in January 2017 she continued to ignore the problem until it started causing her pain, and she did not immediately bring it to her physician's attention, but did mention it at the time of screening mammography 10/05/2015. Accordingly the exam was changed to  bilateral diagnostic mammography with tomosynthesis and left ultrasonography at [facility]. The breast density was category B. In the right breast at the 1:00 position there was a new mass which was palpable. Ultrasonography of the right breast confirmed a 3.2 cm lobulated mass in the right breast upper inner quadrant which was hypoechoic. Ultrasound of the right axilla found a lymph node which was increased in size  On 10/14/2015 the patient underwent biopsy of the right breast mass in question and the abnormal right axillary lymph node. 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 The right axillary lymph node was read as benign but this was felt to be  discordant.  Her subsequent history is as detailed below  INTERVAL HISTORY: Kaitlyn Cervantes was evaluated in the multidisciplinary breast cancer conference for 12th 2017 accompanied by her son Kaitlyn Cervantes. Her case was also presented in the multidisciplinary breast cancer conference that same morning. At that time a preliminary plan was proposed: Neoadjuvant chemotherapy after re-biopsying the suspicious lymph node, then definitive surgery and radiation. Genetics consultation was also recommended.  REVIEW OF SYSTEMS: Aside from discomfort in the breast itself, there There were no specific symptoms leading to the original mammogram, which was routinely scheduled. The patient denies unusual headaches, visual changes, nausea, vomiting, stiff neck, dizziness, or gait imbalance. There has been no cough, phlegm production, or pleurisy, no chest pain or pressure, and no change in bowel or bladder habits. The patient denies fever, rash, bleeding, unexplained fatigue or unexplained weight loss. She has a history of kidney stones which is not currently active. A detailed review of systems was otherwise entirely negative.     PAST MEDICAL HISTORY: Past Medical History  Diagnosis Date  . Diabetes mellitus without complication ([place])   . Hypertension   . Sleep apnea 2005  . Anemia   . Blood transfusion without reported diagnosis   . Kidney stones   . Breast cancer of upper-inner quadrant of right female breast ([place]) 10/19/2015    PAST SURGICAL HISTORY: Past Surgical History  Procedure Laterality Date  . Kidney stones  2007  . Cesarean section    . Splenectomy      FAMILY HISTORY Family History  Problem Relation Age of Onset  . Cancer Mother   . Breast cancer Mother   .  Heart disease Father   . Cancer Sister   . Cancer Maternal Aunt   . Breast cancer Maternal Aunt   The patient's father died at age 63, which she Little about him and does not know the exact cause of death the patient's mother died at the age  of 49. She had been diagnosed with breast cancer at age 8. In additional one maternal aunt was diagnosed with breast cancer in her middle-age. There is no history of ovarian cancer in the family   GYNECOLOGIC HISTORY:  No LMP recorded. Patient is postmenopausal.  menarche age 53, first live birth age 48. She is GX P3. She stopped having periods in 2009. She did not use hormone replacement. She used oral contraceptives remotely for approximately one year, with no complications.   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 She is widowed. At home her brother Kaitlyn Cervantes who is a Animal nutritionist and currently looking for a job. The patient's 3 children are also currently staying there. They came to help with her data and have stayed to help with her. Kaitlyn Cervantes study liver large out of state and is currently looking for a job. Daughter Kaitlyn Cervantes's and her daughter and Kaitlyn Cervantes, age 55, are also at the home Kaitlyn Cervantes's is a Insurance claims handler. Daughter Kaitlyn Cervantes is a Education administrator in [place] but currently here in town. The patient has 17 grandchildren (including step grandchildren) and 5 great-grandchildren. She attends a local [employer].    ADVANCED DIRECTIVES: Not in place    HEALTH MAINTENANCE: Social History  Substance Use Topics  . Smoking status: Never Smoker   . Smokeless tobacco: Never Used  . Alcohol Use: No     Colonoscopy: Never   PAP: 2014   Bone density: never   Lipid panel:  Allergies  Allergen Reactions  . Iohexol      Code: HIVES, Desc: 13 hr premed     Current Outpatient Prescriptions  Medication Sig Dispense Refill  . amLODipine (NORVASC) 5 MG tablet Take 1 tablet by mouth  daily 90 tablet 2  . hydrochlorothiazide (HYDRODIURIL) 25 MG tablet Take 1 tablet (25 mg total) by mouth daily. 30 tablet 3  . lisinopril (PRINIVIL,ZESTRIL) 40 MG tablet Take 1 tablet by mouth  daily 90 tablet 2  . metFORMIN (GLUCOPHAGE) 500 MG  tablet Take 500 mg twice a day for 1 week, then take 1000 mg twice a day until you see me. 180 tablet 3  . Multiple Vitamin (MULTI-VITAMIN PO) Take 1 tablet by mouth daily.    Marland Kitchen sulfamethoxazole-trimethoprim (BACTRIM,SEPTRA) 400-80 MG tablet     . HYDROcodone-acetaminophen (NORCO/VICODIN) 5-325 MG per tablet Take 1-2 tablets by mouth every 4 (four) hours as needed. (Patient not taking: Reported on 08/31/2015) 20 tablet 0  . ondansetron (ZOFRAN) 4 MG tablet Take 1 tablet (4 mg total) by mouth every 6 (six) hours. (Patient not taking: Reported on 10/21/2015) 12 tablet 0  . ranitidine (ZANTAC) 150 MG tablet Take 150 mg by mouth 2 (two) times daily. Reported on 10/21/2015    . simvastatin (ZOCOR) 20 MG tablet Take 1 tablet (20 mg total) by mouth daily. (Patient not taking: Reported on 08/22/2014) 30 tablet 6   No current facility-administered medications for this visit.    OBJECTIVE: Middle-aged African-American woman in no acute distress  Filed Vitals:   10/21/15 1306  BP: 175/84  Pulse: 85  Temp: 98 F (36.7 C)  Resp: 18  Body mass index is 39.77 kg/(m^2).    ECOG FS:0 - Asymptomatic  Ocular: Sclerae unicteric, pupils equal, round and reactive to light Ear-nose-throat: Oropharynx clear and moist Lymphatic: No cervical or supraclavicular adenopathy Lungs no rales or rhonchi, good excursion bilaterally Heart regular rate and rhythm, no murmur appreciated Abd soft, nontender, positive bowel sounds MSK no focal spinal tenderness, no joint edema Neuro: non-focal, well-oriented, appropriate affect Breasts: There is a movable mass in the upper right breast, mostly in the upper inner quadrant, history approximately 4 cm by palpation. It was no associated skin changes. There is no nipple retraction. The right axilla is benign. The left breast is unremarkable.    LAB RESULTS:  CMP     Component Value Date/Time   NA 140 10/21/2015 1240   NA 139 08/31/2015 1435   K 4.4 10/21/2015 1240   K  4.4 08/31/2015 1435   CL 102 08/31/2015 1435   CO2 26 10/21/2015 1240   CO2 28 08/31/2015 1435   GLUCOSE 139 10/21/2015 1240   GLUCOSE 169* 08/31/2015 1435   BUN 9.9 10/21/2015 1240   BUN 13 08/31/2015 1435   CREATININE 0.9 10/21/2015 1240   CREATININE 0.98 08/31/2015 1435   CREATININE 1.09 08/22/2014 1936   CALCIUM 9.6 10/21/2015 1240   CALCIUM 9.3 08/31/2015 1435   PROT 7.6 10/21/2015 1240   PROT 7.6 08/22/2014 1936   ALBUMIN 3.6 10/21/2015 1240   ALBUMIN 4.0 08/22/2014 1936   AST 14 10/21/2015 1240   AST 21 08/22/2014 1936   ALT 12 10/21/2015 1240   ALT 17 08/22/2014 1936   ALKPHOS 90 10/21/2015 1240   ALKPHOS 114 08/22/2014 1936   BILITOT 0.30 10/21/2015 1240   BILITOT 0.4 08/22/2014 1936   GFRNONAA 65 08/31/2015 1435   GFRNONAA 56* 08/22/2014 1936   GFRAA 75 08/31/2015 1435   GFRAA 65* 08/22/2014 1936    INo results found for: SPEP, UPEP  Lab Results  Component Value Date   WBC 5.1 10/21/2015   NEUTROABS 2.3 10/21/2015   HGB 12.7 10/21/2015   HCT 40.1 10/21/2015   MCV 78.8* 10/21/2015   PLT 284 10/21/2015      Chemistry      Component Value Date/Time   NA 140 10/21/2015 1240   NA 139 08/31/2015 1435   K 4.4 10/21/2015 1240   K 4.4 08/31/2015 1435   CL 102 08/31/2015 1435   CO2 26 10/21/2015 1240   CO2 28 08/31/2015 1435   BUN 9.9 10/21/2015 1240   BUN 13 08/31/2015 1435   CREATININE 0.9 10/21/2015 1240   CREATININE 0.98 08/31/2015 1435   CREATININE 1.09 08/22/2014 1936      Component Value Date/Time   CALCIUM 9.6 10/21/2015 1240   CALCIUM 9.3 08/31/2015 1435   ALKPHOS 90 10/21/2015 1240   ALKPHOS 114 08/22/2014 1936   AST 14 10/21/2015 1240   AST 21 08/22/2014 1936   ALT 12 10/21/2015 1240   ALT 17 08/22/2014 1936   BILITOT 0.30 10/21/2015 1240   BILITOT 0.4 08/22/2014 1936       No results found for: LABCA2  No components found for: LABCA125  No results for input(s): INR in the last 168 hours.  Urinalysis    Component Value  Date/Time   COLORURINE YELLOW 08/22/2014 2242   APPEARANCEUR CLEAR 08/22/2014 2242   LABSPEC 1.016 08/22/2014 2242   PHURINE 7.5 08/22/2014 2242   GLUCOSEU 100* 08/22/2014 2242   HGBUR MODERATE* 08/22/2014 2242   BILIRUBINUR NEG 08/31/2015 1359  BILIRUBINUR NEGATIVE 08/22/2014 [place] 08/22/2014 2242   PROTEINUR 30 08/31/2015 1359   PROTEINUR NEGATIVE 08/22/2014 2242   UROBILINOGEN 1.0 08/31/2015 1359   UROBILINOGEN 1.0 08/22/2014 2242   NITRITE NEG 08/31/2015 1359   NITRITE NEGATIVE 08/22/2014 2242   LEUKOCYTESUR moderate (2+)* 08/31/2015 1359      ELIGIBLE FOR AVAILABLE RESEARCH PROTOCOL: no  STUDIES:  outside studies reviewed   ASSESSMENT: 54 y.o. [place] woman status post right breast upper outer inner quadrant biopsy 10/14/2015 for a clinical T2 N1, stage IIB invasive ductal carcinoma, grade 2 or 3, triple negative, with an MIB-1 of 90%  (a) right axillary lymph node biopsy for 11/28/2015 was negative but discordant  (b) rebiopsy of the suspicious right axillary lymph node planned or 10/22/2015  (1) neoadjuvant chemotherapy to consist of cyclophosphamide and doxorubicin in dose dense fashion 4, to be followed by paclitaxel and carboplatin weekly 12  (2) definitive surgery to follow chemotherapy  (3) adjuvant radiation to follow surgery  (4) genetics testing pending  PLAN: We spent the better part of today's hour-long appointment discussing the biology of breast cancer in general, and the specifics of the patient's tumor in particular. We started by discussing the cancer is not one disease but more than 100 different diseases and that they should not be confused. Specifically she should not think that because someone with lung cancer had a specific treatment that treatment might be useful in her case. We also discussed the fact that if breast cancer were to spread for example to liver or bones, the patient would not have liver cancer or bone cancer.  She would have breast cancer in the liver and breast cancer in the bone. She would have not 3 separate cancers but 1 cancer in 3 places.  Kaitlyn Cervantes understands that her cancer is on the large side by current standards. She also understands that although the lymph node biopsy was negative we feel that we may have missed the cancer in that lymph node and accordingly we have set her up for repeat biopsy tomorrow. In any case the cancer at this point is medically stage II.  We discussed the difference between local and systemic therapy for breast cancer and also the fact that there is no difference in survival whether chemotherapy is given before or after surgery.   As far as local treatment is concerned, she appears to be a candidate for breast conservation and doing chemotherapy neoadjuvantly she would optimize the cosmetic results and make the surgery easier. She will need radiation after the lumpectomy.  She understands the need for systemic therapy and she understands that of the 3 systemic therapy options she is not a candidate for anti-estrogens or anti-HER-2 immunotherapy. We reviewed the meaning of her cancer being "triple negative". The bottom line is that the only systemic therapy available for her is chemotherapy accordingly that is our recommendation.  We discussed how chemotherapy become standard of care and the sequence of studies that have led to the current standard, which is doxorubicin and cyclophosphamide in dose dense fashion 4 followed by weekly paclitaxel and carboplatin  12. We also discussed the possible toxicities, side effects and complications of these agents.  The patient's son in particular expressed interest in alternative therapy and questionedhether chemotherapy in some cases may feed the tumor instead of killing the cancer cells. The patient was surprised to find out that Kaitlyn Cervantes was a [place] since she appears to believe that God and science are in conflict.  Kaitlyn Cervantes also wanted  to know what would happen if she didn't have any treatment. These issues only came out at the end of a very long discussion and they do raise a concern in this patient who was aware of a change in her breast for many months before bringing it to medical attention.   Kaitlyn Cervantes reassured Kaitlyn Cervantes that most of her doctors including myself indeed are believers. We discussed that alternative and complementary medicine can be helpful but is not a substitute for curative standard therapy. She understands that if she does not get treated my expectation is that she will develop metastatic disease which would at some point take her life.  Kaitlyn Cervantes would like to continue to work through chemotherapy if possible. She works [employer] Tuesdays and Thursdays and Fridays. Kaitlyn Cervantes suggested we start chemotherapy Wednesday, April 26, and that she may want to take off the next 2 days as well as the weekend. She certainly should be able to get back to work the following Monday. With the second cycle she would have at least that much experience and with no whether she could work on days 2 and/or 3. She is aware that temporary disbility is an option.   At this point we are setting her up for a port, echo, chemotherapy school, and to meet with my nurse practitioner to discuss antinausea medications. Kaitlyn Cervantes has a good understanding of the overall plan, which was given to her in writing. She knows the goal of treatment in her case is cure. She will call with any problems that may develop before her next visit here.  Chauncey Cruel, MD   10/21/2015 1:36 PM Medical Oncology and Hematology [facility] [address] [place], [place] [address] Tel. [phone]    Fax. [phone]

## 2015-10-21 NOTE — Progress Notes (Signed)
[place] Psychosocial distress screening Counseling Intern  Counseling intern was referred by distress screening protocol. The pt scored a 10 on the psychosocial distress thermometer, which indicates severe distress. Counseling intern to assess for distress and other psychosocial needs.   ONCBCN DISTRESS SCREENING 10/21/2015  Screening Type Initial Screening  Distress experienced in past week (1-10) 10  Family Problem type Other (comment)  Emotional problem type Adjusting to illness  Spiritual/Religous concerns type Facing my mortality;Loss of sense of purpose  Referral to support programs Yes  Other (No Data)   Met with pt and pt's son Jori Moll during [facility]. Pt reported that her distress had decreased from a 10 to a 9 or 9 1/2 by the time of our encounter. Pt stated that distress is largely due to the recent loss of her husband in January as well as current stress of diagnosis. 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 Counseling intern introduced supportive services to pt.  Follow up - direct referral to [facility] for counseling support services.

## 2015-10-21 NOTE — Progress Notes (Addendum)
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 female who presented with a palpable right breast mass. Imaging showed a 3.2 cm mass in the 1 o'clock position of the breast. Imaging also showed an enlarged lymph node. Biopsy of the breast was performed showed Grade 2 invasive ductal carcinoma, triple negative, Ki-67=90%. Lymph node on biopsy was negative, this was felt to be discordinant. Recommendations are for re-biopsying the axillary lymph node.  Family history of maternal aunt and mother with breast cancer.   PREVIOUS RADIATION THERAPY: No  PAST MEDICAL HISTORY:  has a past medical history of Diabetes mellitus without complication (HCC); Hypertension; Sleep apnea (2005); Anemia; Blood transfusion without reported diagnosis; Kidney stones; and Breast cancer of upper-inner quadrant of right female breast (HCC) (10/19/2015).    PAST SURGICAL HISTORY: Past Surgical History  Procedure Laterality Date  . Kidney stones  2007  . Cesarean section    . Splenectomy      FAMILY HISTORY: family history includes Breast cancer in Kaitlyn maternal aunt and mother; Cancer in Kaitlyn maternal aunt, mother, and sister; Heart disease in Kaitlyn father.  SOCIAL HISTORY:  reports that she has never smoked. She has never used smokeless tobacco. She reports that she does not drink alcohol.  ALLERGIES: Iohexol  MEDICATIONS:  Current Outpatient Prescriptions  Medication Sig Dispense Refill  . amLODipine (NORVASC) 5 MG tablet Take 1 tablet by mouth  daily 90 tablet 2  . hydrochlorothiazide (HYDRODIURIL) 25 MG tablet Take 1 tablet (25 mg  total) by mouth daily. 30 tablet 3  . HYDROcodone-acetaminophen (NORCO/VICODIN) 5-325 MG per tablet Take 1-2 tablets by mouth every 4 (four) hours as needed. (Patient not taking: Reported on 08/31/2015) 20 tablet 0  . lisinopril (PRINIVIL,ZESTRIL) 40 MG tablet Take 1 tablet by mouth  daily 90 tablet 2  . metFORMIN (GLUCOPHAGE) 500 MG tablet Take 500 mg twice a day for 1 week, then take 1000 mg twice a day until you see me. 180 tablet 3  . Multiple Vitamin (MULTI-VITAMIN PO) Take 1 tablet by mouth daily.    . ondansetron (ZOFRAN) 4 MG tablet Take 1 tablet (4 mg total) by mouth every 6 (six) hours. (Patient not taking: Reported on 10/21/2015) 12 tablet 0  . ranitidine (ZANTAC) 150 MG tablet Take 150 mg by mouth 2 (two) times daily. Reported on 10/21/2015    . simvastatin (ZOCOR) 20 MG tablet Take 1 tablet (20 mg total) by mouth daily. (Patient not taking: Reported on 08/22/2014) 30 tablet 6  . sulfamethoxazole-trimethoprim (BACTRIM,SEPTRA) 400-80 MG tablet      No current facility-administered medications for this encounter.   Gynecologic History  Age at first menstrual period? 8  Are you still having periods? No Approximate date of last period? 2009  If you are still having periods: Are your periods regular? No  If you no longer have periods: Have you used hormone replacement? No  If YES, for how long? N/A When did you stop? N/A Obstetric History:  How many children have you carried to term? 3 Your age at first live birth? 3???  Pregnant now or trying to get  pregnant? No  Have you used birth control pills or hormone shots for contraception? Yes  If so, for how long (or approximate dates)? 1 year, 31  Would you be interested in learning more about the options to preserve fertility? No Health Maintenance:  Have you ever had a colonoscopy? No If yes, date? N/A  Have you ever had a bone density? No If yes, date? N/A  Date of your last PAP smear? 2014 Date of your FIRST mammogram? 2013  REVIEW  OF SYSTEMS:  A 15 point review of systems is documented in the electronic medical record. This was obtained by the nursing staff. However, I reviewed this with the patient to discuss relevant findings and make appropriate changes.  No nipple discharge or bleeding prior to diagnosis   Kaitlyn Cervantes is accompanied by Kaitlyn Cervantes. She initially noticed some pain in Kaitlyn right breast when Kaitlyn niece fell asleep on Kaitlyn breast. About a month or so later, she felt the breast lump. She did not notice any bruises on Kaitlyn breast at that time. Denies nipple discharge. In March, Kaitlyn breast felt inflamed and somewhat larger but this has resolved. Denies any problem raising Kaitlyn right arm or right shoulder issues. She continues to work outside the home. The patient and Kaitlyn Cervantes are interested in getting a second opinion.   PHYSICAL EXAM:  Vitals with BMI 10/21/2015  Height '5\' 4"'$   Weight 231 lbs 13 oz  BMI 67.2  Systolic 094  Diastolic 84  Pulse 85  Respirations 18     General: Alert and oriented, in no acute distress HEENT: Head is normocephalic. Extraocular movements are intact. Oropharynx is clear. Neck: Neck is supple, no palpable cervical or supraclavicular lymphadenopathy. Heart: Regular in rate and rhythm with no murmurs, rubs, or gallops. Chest: Clear to auscultation bilaterally, with no rhonchi, wheezes, or rales. Breast: Left breast shows no palpable mass or nipple discharge. Right breast shows a large mass in the approximately 1 o'clock position that extends over approximately 6-7 cm. There is no nipple discharge or bleeding or skin involvement. When the patient lies flat, the mass protrudes out and is easily visible while laying flat.    ECOG = 1  LABORATORY DATA:  Lab Results  Component Value Date   WBC 5.1 10/21/2015   HGB 12.7 10/21/2015   HCT 40.1 10/21/2015   MCV 78.8* 10/21/2015   PLT 284 10/21/2015   NEUTROABS 2.3 10/21/2015   Lab Results  Component Value Date   NA 140 10/21/2015   K 4.4  10/21/2015   CL 102 08/31/2015   CO2 26 10/21/2015   GLUCOSE 139 10/21/2015   CREATININE 0.9 10/21/2015   CALCIUM 9.6 10/21/2015      RADIOGRAPHY: Imaging performed at [facility] and reviewed at conference,  reports reviewed    IMPRESSION: Clinical T3 invasive ductal carcinoma of the right breast, high grade, triple negative. The patient's axillary biopsy is not concordant with imaging studies. We will re-biopsy this node tomorrow. The patient will also have an MRI the same day. Based on the size of the tumor on exam today, she would likely benefit from neo adjuvant chemotherapy. The patient will also be referred to genetics for evaluation.  PLAN: I spoke to the patient today regarding Kaitlyn diagnosis and options for treatment. We discussed the equivalence in terms of survival and local failure between mastectomy and breast conservation. We discussed the role of radiation in decreasing local failures in patients who undergo lumpectomy. We discussed the process  of simulation and the placement tattoos. We discussed 4-6 weeks of treatment as an outpatient. We discussed the possibility of asymptomatic lung damage. We discussed the low likelihood of secondary malignancies. We discussed the possible side effects including but not limited to skin redness, fatigue, permanent skin darkening, and breast swelling.   The patient will undergo bilateral breast MRI and re-biopsy of the axillary node tomorrow. She will likely proceed with neoadjuvant chemotherapy. She will let us know if she wishes to proceed with a referral for a second opinion at an outside institution. The patient will also undergo genetics referral in light of Kaitlyn family history of breast cancer. Initiation of neoadjuvant chemotherapy will allow the patient to proceed with therapy in the near future but also allow the patient to make a final determination concerning Kaitlyn definitive surgical approach after genetics results are complete.     ------------------------------------------------  Blair Promise, PhD, MD     This document serves as a record of services personally performed by Gery Pray, MD. It was created on his behalf by Arlyce Harman, a trained medical scribe. The creation of this record is based on the scribe's personal observations and the provider's statements to them. This document has been checked and approved by the attending provider.

## 2015-10-21 NOTE — Progress Notes (Signed)
[name] is a very pleasant 55 y.o. female from [place], [place] with newly diagnosed invasive ductal carcinoma and ductal carcinoma in situ of the right breast.  Biopsy results revealed the tumor's prognostic profile is ER negative, PR negative and HER2/neu negative. She presents today with her son to the [facility]) for treatment consideration and recommendations from the breast surgeon, radiation oncologist, and medical oncologist.     I briefly met with [name] and her son during her [facility] visit today. The treatment plan for [name] will likely include surgery, chemotherapy, and radiation therapy.  She will meet with the Genetics Counselor due to her family history of breast cancer. As of today, the intent of treatment for [name] is cure, therefore she will be eligible for the Survivorship Clinic upon her completion of treatment.  Her survivorship care plan (SCP) document will be drafted and updated throughout the course of her treatment trajectory. She will receive the SCP in an office visit with myself in the Survivorship Clinic once she has completed treatment.   [name] was encouraged to ask questions and all questions were answered to her satisfaction.  She was given my business card and encouraged to contact me with any concerns regarding survivorship.  I look forward to participating in her care.   Kenn File, [place] [phone]

## 2015-10-22 ENCOUNTER — Ambulatory Visit
Admission: RE | Admit: 2015-10-22 | Discharge: 2015-10-22 | Disposition: A | Discharge status: Home / Self Care | Payer: Commercial Managed Care - HMO | Source: Ambulatory Visit | Attending: Oncology | Admitting: Oncology

## 2015-10-22 DIAGNOSIS — C50211 Malignant neoplasm of upper-inner quadrant of right female breast: Secondary | ICD-10-CM

## 2015-10-22 MED ORDER — GADOBENATE DIMEGLUMINE 529 MG/ML IV SOLN
20.0000 mL | Freq: Once | INTRAVENOUS | Status: AC | PRN
Start: 2015-10-22 — End: 2015-10-22
  Administered 2015-10-22: 20 mL via INTRAVENOUS

## 2015-10-27 ENCOUNTER — Telehealth: Payer: Self-pay | Admitting: Family Medicine

## 2015-10-27 ENCOUNTER — Other Ambulatory Visit: Payer: BC Managed Care – PPO

## 2015-10-27 ENCOUNTER — Telehealth: Payer: Self-pay | Admitting: *Deleted

## 2015-10-27 NOTE — Telephone Encounter (Signed)
Kaitlyn Cervantes is calling for a referral to [facility] to get a second opinion of the dx of breast ca from [place].  Had labs drawn last week at the [facility] at [place].  Had an MRI done at [facility].  She was given a tx plan from the [facility], but want to get a second opinion first.

## 2015-10-27 NOTE — Progress Notes (Signed)
Spoke with Claiborne Billings at [facility] [facility] ( Triage Nurse) and made her aware that patient informed me when I called her to give her preop instructions that she was either going to cancel or postpone her port placement for 10/29/15.

## 2015-10-27 NOTE — Telephone Encounter (Signed)
No additional notes

## 2015-10-28 ENCOUNTER — Telehealth: Payer: Self-pay | Admitting: Family Medicine

## 2015-10-28 ENCOUNTER — Ambulatory Visit (INDEPENDENT_AMBULATORY_CARE_PROVIDER_SITE_OTHER): Payer: Commercial Managed Care - HMO | Admitting: Family Medicine

## 2015-10-28 ENCOUNTER — Telehealth: Payer: Self-pay | Admitting: *Deleted

## 2015-10-28 VITALS — BP 156/75 | HR 81 | Temp 98.1°F | Wt 228.1 lb

## 2015-10-28 DIAGNOSIS — I10 Essential (primary) hypertension: Secondary | ICD-10-CM | POA: Diagnosis not present

## 2015-10-28 DIAGNOSIS — C50211 Malignant neoplasm of upper-inner quadrant of right female breast: Secondary | ICD-10-CM

## 2015-10-28 MED ORDER — AMLODIPINE BESYLATE 10 MG PO TABS: 10.0000 mg | ORAL_TABLET | Freq: Every day | ORAL | Status: DC

## 2015-10-28 NOTE — Telephone Encounter (Signed)
Left VM for patient. If she calls back please have her speak with a nurse/CMA and inform that I need to stop the hydrochlorothiazide since she has a history of kidney stones and this medication can worsen this. She should continue the amlodipine and I have increased it today.   If any questions then please take the best time and phone number to call and I will try to call her back.   Rosemarie Ax, MD PGY-3, [facility] 10/28/2015, 5:41 PM

## 2015-10-28 NOTE — Telephone Encounter (Signed)
LMOVM for pt to return my call. Please advise pt of the following appt :  An appointment with Surgery has been made on your behalf for the following day:    Wednesday, November 04, 2015 at 2:00 PM with Dr. Ronnette Hila  [facility] 4th Floor [facility] [place], [place] [address] [phone]

## 2015-10-28 NOTE — Assessment & Plan Note (Addendum)
Increase amlodipine to 10 mg  She has stones so would avoid HCTZ. I will call her and stop this.  - advised to follow up in one month.

## 2015-10-28 NOTE — Assessment & Plan Note (Signed)
Referral has been placed surgery and to [facility] at her request for a second opinion.

## 2015-10-28 NOTE — Progress Notes (Signed)
   Subjective:    Kaitlyn Cervantes - 55 y.o. female MRN [id number]  Date of birth: 1961-03-12  CC breast cancer   HPI  Kaitlyn Cervantes is here for breast cancer .  Breast cancer  This was seen in her mammogram in March.  Recently diagnosed and has been seen oncology and surgery.  She would like a second opinion about her ongoing care.  Was scheduled to have a port placed but has cancelled the surgery for now.  Planned to receive chemotherapy prior to having a surgery.   HTN Disease Monitoring: Home BP Monitoring occasionally   Medications: amlodipine  Chest pain- none     Dyspnea- none Compliance-  Yes .  Lightheadedness-  no   Edema- no  Health Maintenance:  Health Maintenance Due  Topic Date Due  . PNEUMOCOCCAL POLYSACCHARIDE VACCINE (1) 03/24/1962  . OPHTHALMOLOGY EXAM  03/24/1970  . COLONOSCOPY  03/24/2010    Review of Systems See HPI     Objective:   Physical Exam BP 156/75 mmHg  Pulse 81  Temp(Src) 98.1 F (36.7 C) (Oral)  Wt 228 lb 1.6 oz (103.465 kg) Gen: NAD, alert, cooperative with exam,  CV: RRR, good S1/S2, no murmur, no edema,  Skin: no rashes, normal turgor  Neuro: no gross deficits.  Psych:  alert and oriented  Assessment & Plan:   Breast cancer of upper-inner quadrant of right female breast [facility]) Referral has been placed surgery and to [facility] at her request for a second opinion.   Essential hypertension, benign Increase amlodipine to 10 mg  She has stones so would avoid HCTZ. I will call her and stop this.  - advised to follow up in one month.

## 2015-10-28 NOTE — Telephone Encounter (Signed)
Left message for a return phone call to follow up from [facility]. °

## 2015-10-28 NOTE — Patient Instructions (Signed)
Thank you for coming in,   I have increased your amlodipine to 10 mg.   Please follow up with me in one month for your blood pressure and diabetes.   I have made a referral to [facility].   Please bring all of your medications with you to each visit.   Sign up for My Chart to have easy access to your labs results, and communication with your Primary care physician   Please feel free to call with any questions or concerns at any time, at [phone]. --Dr. Raeford Razor  Diet Recommendations for Hypertension  Starchy (carb) foods include: Bread, rice, pasta, potatoes, corn, crackers, bagels, muffins, all baked goods.   Protein foods include: Meat, fish, poultry, eggs, dairy foods, and beans such as pinto and kidney beans (beans also provide carbohydrate).   1. Eat at least 3 meals and 1-2 snacks per day. Never go more than 4-5 hours while awake without eating.  2. Limit starchy foods to TWO per meal and ONE per snack. ONE portion of a starchy  food is equal to the following:   - ONE slice of bread (or its equivalent, such as half of a hamburger bun).   - 1/2 cup of a "scoopable" starchy food such as potatoes or rice.   - 1 OUNCE (28 grams) of starchy snack foods such as crackers or pretzels (look on label).   - 15 grams of carbohydrate as shown on food label.  3. Both lunch and dinner should include a protein food, a carb food, and vegetables.   - Obtain twice as many veg's as protein or carbohydrate foods for both lunch and dinner.   - Try to keep frozen veg's on hand for a quick vegetable serving.     - Fresh or frozen veg's are best.  4. Breakfast should always include protein.

## 2015-10-29 ENCOUNTER — Telehealth: Payer: Self-pay | Admitting: Oncology

## 2015-10-29 ENCOUNTER — Ambulatory Visit (HOSPITAL_COMMUNITY)
Admission: RE | Admit: 2015-10-29 | Payer: Commercial Managed Care - HMO | Source: Ambulatory Visit | Admitting: General Surgery

## 2015-10-29 ENCOUNTER — Other Ambulatory Visit: Payer: Self-pay | Admitting: Oncology

## 2015-10-29 ENCOUNTER — Encounter (HOSPITAL_COMMUNITY): Admission: RE | Payer: Self-pay | Source: Ambulatory Visit

## 2015-10-29 ENCOUNTER — Telehealth: Payer: Self-pay | Admitting: *Deleted

## 2015-10-29 SURGERY — INSERTION, TUNNELED CENTRAL VENOUS DEVICE, WITH PORT
Anesthesia: General

## 2015-10-29 NOTE — Telephone Encounter (Signed)
Received message from patient that she is going for a second opinion at [facility] and will call back 5/1 with her decision.  I will notify team.

## 2015-10-29 NOTE — Telephone Encounter (Signed)
[facility] [phone]

## 2015-10-30 ENCOUNTER — Ambulatory Visit: Payer: Commercial Managed Care - HMO | Admitting: Oncology

## 2015-10-30 ENCOUNTER — Telehealth: Payer: Self-pay | Admitting: General Practice

## 2015-10-30 ENCOUNTER — Telehealth: Payer: Self-pay | Admitting: Family Medicine

## 2015-10-30 NOTE — Telephone Encounter (Signed)
Telephone call  Called and left voicemail regarding finding a time to schedule the first counseling appointment.  Wendall Papa, MS, [place], [facility] Counseling Intern-Department for Spiritual Care and [place], [place], [place]

## 2015-10-30 NOTE — Telephone Encounter (Signed)
Patient asks PCP to complete FMLA Forms. Please, follow up.

## 2015-11-02 NOTE — Telephone Encounter (Signed)
Forms placed in PCP box for completion. Kaitlyn Cervantes, [name] D, [place]

## 2015-11-04 ENCOUNTER — Telehealth: Payer: Self-pay | Admitting: Family Medicine

## 2015-11-04 ENCOUNTER — Other Ambulatory Visit: Payer: BC Managed Care – PPO

## 2015-11-04 ENCOUNTER — Ambulatory Visit: Payer: BC Managed Care – PPO

## 2015-11-04 DIAGNOSIS — IMO0002 Reserved for concepts with insufficient information to code with codable children: Secondary | ICD-10-CM | POA: Insufficient documentation

## 2015-11-04 NOTE — Telephone Encounter (Signed)
Pt called because she was checking on the status of her FMLA papers and also her medication Amlodipine/ Norvasc was picked up but they gave her the generic of this which is the Amlodipine. She needs the brand name medication to take and this would have to have a Prior authorization and must say brand name and medically necessary on the prescription. jw

## 2015-11-06 ENCOUNTER — Encounter: Payer: Self-pay | Admitting: General Practice

## 2015-11-06 ENCOUNTER — Encounter: Payer: Self-pay | Admitting: Oncology

## 2015-11-06 NOTE — Progress Notes (Signed)
Telephone call  Per referral from other counseling intern, counselor contacted client in regards to scheduling a counseling appointment. Client did not answer. Counselor left voicemail about calling back to schedule a time to set up her first counseling appointment.    Wendall Papa, MS, [place], [facility] Counseling Intern-Department for [place], [place], [facility],  [phone]

## 2015-11-06 NOTE — Progress Notes (Signed)
Called pt to discuss copay assistance w/ the [employer] program.  Pt informed me that she will call me on 11/09/15 because she hasn't decided on where she would like to have treatment since she got a 2nd opinion.  I verbalized understanding.

## 2015-11-09 MED ORDER — AMLODIPINE BESYLATE 10 MG PO TABS: 10.0000 mg | ORAL_TABLET | Freq: Every day | ORAL | Status: DC

## 2015-11-09 NOTE — Telephone Encounter (Signed)
Discussed with patient her amlodipine and FMLA paperwork. Resent the norvasc as a Brand rx. Will complete the paperwork and call patient when done.   Rosemarie Ax, MD PGY-3, [facility] 11/09/2015, 1:56 PM

## 2015-11-09 NOTE — Telephone Encounter (Signed)
Left voice message for patient that FMLA forms were complete.  Forms copied for scanning in patient's record.  Forms placed up front for pick up.  Derl Barrow, RN

## 2015-11-09 NOTE — Telephone Encounter (Signed)
FMLA form completed qand placed in Tamika's box.   Rosemarie Ax, MD PGY-3, [facility] 11/09/2015, 2:15 PM

## 2015-11-10 ENCOUNTER — Telehealth (HOSPITAL_COMMUNITY): Payer: Self-pay | Admitting: Vascular Surgery

## 2015-11-10 NOTE — Telephone Encounter (Signed)
Left pt message to make new pt appt w/ [facility] ASAP

## 2015-11-11 ENCOUNTER — Ambulatory Visit: Payer: BC Managed Care – PPO | Admitting: Oncology

## 2015-11-11 ENCOUNTER — Other Ambulatory Visit: Payer: BC Managed Care – PPO

## 2015-11-11 ENCOUNTER — Telehealth (HOSPITAL_COMMUNITY): Payer: Self-pay | Admitting: Vascular Surgery

## 2015-11-11 NOTE — Telephone Encounter (Signed)
Left message to make new pt appt 

## 2015-11-12 ENCOUNTER — Telehealth (HOSPITAL_COMMUNITY): Payer: Self-pay | Admitting: Vascular Surgery

## 2015-11-12 NOTE — Telephone Encounter (Signed)
LEFT MESSAGE TO GET PT NEW PT APPT

## 2015-11-18 ENCOUNTER — Other Ambulatory Visit: Payer: Self-pay | Admitting: Oncology

## 2015-11-18 ENCOUNTER — Other Ambulatory Visit: Payer: BC Managed Care – PPO

## 2015-11-18 ENCOUNTER — Ambulatory Visit: Payer: BC Managed Care – PPO

## 2015-11-18 ENCOUNTER — Telehealth: Payer: Self-pay | Admitting: *Deleted

## 2015-11-18 NOTE — Telephone Encounter (Signed)
This RN was notified by treatment room nurse that pt was a no show for scheduled appointment for chemotherapy.  Noted pt has missed prior appointments as well as pt has additional scheduled appointments with documentation that pt was seeking a second opinion.  This note will be sent to breast navigator for appropriate follow up per above.

## 2015-11-19 ENCOUNTER — Encounter: Payer: Self-pay | Admitting: General Practice

## 2015-11-19 NOTE — Progress Notes (Signed)
Telephone call  Counselor followed up with client again, and left a voicemail to see if the client wanted to schedule a counseling appointment. Counselor will wait to hear back from client.    Wendall Papa, MS, [place], [facility] Counseling Intern-Department for Spiritual Care and [place], [place], [place]

## 2015-11-20 ENCOUNTER — Encounter: Payer: Self-pay | Admitting: Family Medicine

## 2015-11-24 ENCOUNTER — Telehealth: Payer: Self-pay | Admitting: *Deleted

## 2015-11-24 NOTE — Telephone Encounter (Signed)
Left message for a return phone call to find out what her decision is regarding her 2nd opinion.

## 2015-11-26 ENCOUNTER — Encounter: Payer: BC Managed Care – PPO | Admitting: Genetic Counselor

## 2015-11-26 ENCOUNTER — Other Ambulatory Visit: Payer: BC Managed Care – PPO

## 2015-12-02 ENCOUNTER — Ambulatory Visit: Payer: BC Managed Care – PPO

## 2015-12-02 ENCOUNTER — Other Ambulatory Visit: Payer: BC Managed Care – PPO

## 2015-12-03 ENCOUNTER — Telehealth: Payer: Self-pay | Admitting: *Deleted

## 2015-12-03 ENCOUNTER — Encounter: Payer: Commercial Managed Care - HMO | Admitting: Family Medicine

## 2015-12-03 NOTE — Telephone Encounter (Signed)
Left message for a return phone call to find out about her 2nd opinion.  Per Care Everywhere it looks like she may be receiving care at [facility].

## 2015-12-16 ENCOUNTER — Other Ambulatory Visit: Payer: BC Managed Care – PPO

## 2015-12-16 ENCOUNTER — Ambulatory Visit: Payer: BC Managed Care – PPO

## 2015-12-25 NOTE — Progress Notes (Signed)
This encounter was created in error - please disregard.

## 2015-12-31 ENCOUNTER — Other Ambulatory Visit: Payer: Self-pay | Admitting: Nurse Practitioner

## 2015-12-31 ENCOUNTER — Other Ambulatory Visit: Payer: Self-pay | Admitting: Family Medicine

## 2016-01-03 ENCOUNTER — Other Ambulatory Visit: Payer: Self-pay | Admitting: Family Medicine

## 2016-01-06 ENCOUNTER — Other Ambulatory Visit: Payer: Self-pay | Admitting: *Deleted

## 2016-01-06 NOTE — Telephone Encounter (Signed)
Pt is out of lisinopril and has been for one week.  Needs this ASAP.  Of note: [facility] never sent/received the 40mg s lisinopril sent on 10/12/15.  She has been doubling up on her 20 mgs but is now out.  The pharmacy was sending to Dr. Raoul Pitch instead of Dr. Raeford Razor. Rajeev Escue, Salome Spotted, CMA

## 2016-01-07 MED ORDER — LISINOPRIL 40 MG PO TABS
ORAL_TABLET | ORAL | Status: DC
Start: 2016-01-07 — End: 2016-08-13

## 2016-01-07 NOTE — Telephone Encounter (Signed)
LMOVM informing pt that "the issues we spoke about yesterday has been taken care of, please call with questions" . Oluwatobi Ruppe, Salome Spotted, CMA

## 2016-02-02 ENCOUNTER — Other Ambulatory Visit: Payer: Self-pay | Admitting: *Deleted

## 2016-02-02 MED ORDER — AMLODIPINE BESYLATE 10 MG PO TABS: 10.0000 mg | ORAL_TABLET | Freq: Every day | ORAL | 3 refills | Status: DC

## 2016-02-02 NOTE — Telephone Encounter (Signed)
Received fax from [facility] stating patient request Brand name (norvasc) only.  Please advise.  Derl Barrow, RN

## 2016-02-03 ENCOUNTER — Other Ambulatory Visit: Payer: Self-pay | Admitting: Family Medicine

## 2016-02-03 MED ORDER — NORVASC 10 MG PO TABS: 10.0000 mg | ORAL_TABLET | Freq: Every day | ORAL | 3 refills | Status: DC

## 2016-02-03 NOTE — Telephone Encounter (Signed)
[place] and requested brand name (norvasc) be refilled. Thank you.

## 2016-02-25 DIAGNOSIS — T82898A Other specified complication of vascular prosthetic devices, implants and grafts, initial encounter: Secondary | ICD-10-CM | POA: Insufficient documentation

## 2016-04-15 ENCOUNTER — Encounter: Payer: Self-pay | Admitting: Student

## 2016-04-15 ENCOUNTER — Ambulatory Visit (INDEPENDENT_AMBULATORY_CARE_PROVIDER_SITE_OTHER): Payer: Commercial Managed Care - HMO | Admitting: Student

## 2016-04-15 VITALS — BP 137/70 | HR 78 | Temp 98.7°F | Ht 64.0 in | Wt 219.0 lb

## 2016-04-15 DIAGNOSIS — N3000 Acute cystitis without hematuria: Secondary | ICD-10-CM

## 2016-04-15 DIAGNOSIS — E1165 Type 2 diabetes mellitus with hyperglycemia: Secondary | ICD-10-CM | POA: Diagnosis not present

## 2016-04-15 DIAGNOSIS — IMO0002 Reserved for concepts with insufficient information to code with codable children: Secondary | ICD-10-CM

## 2016-04-15 DIAGNOSIS — R350 Frequency of micturition: Secondary | ICD-10-CM

## 2016-04-15 DIAGNOSIS — E118 Type 2 diabetes mellitus with unspecified complications: Secondary | ICD-10-CM

## 2016-04-15 DIAGNOSIS — N390 Urinary tract infection, site not specified: Secondary | ICD-10-CM | POA: Insufficient documentation

## 2016-04-15 DIAGNOSIS — IMO0001 Reserved for inherently not codable concepts without codable children: Secondary | ICD-10-CM

## 2016-04-15 LAB — POCT URINALYSIS DIPSTICK
Bilirubin, UA: NEGATIVE
GLUCOSE UA: NEGATIVE
Ketones, UA: NEGATIVE
Nitrite, UA: NEGATIVE
Protein, UA: 30
SPEC GRAV UA: 1.015
UROBILINOGEN UA: 0.2
pH, UA: 7

## 2016-04-15 LAB — POCT UA - MICROSCOPIC ONLY: RBC, urine, microscopic: >20

## 2016-04-15 LAB — POCT GLYCOSYLATED HEMOGLOBIN (HGB A1C): Hemoglobin A1C: 7.3

## 2016-04-15 MED ORDER — METFORMIN HCL 1000 MG PO TABS: 1000.0000 mg | ORAL_TABLET | Freq: Two times a day (BID) | ORAL | 3 refills | Status: DC

## 2016-04-15 MED ORDER — CEPHALEXIN 500 MG PO CAPS: 500.0000 mg | ORAL_CAPSULE | Freq: Three times a day (TID) | ORAL | 0 refills | Status: AC

## 2016-04-15 NOTE — Progress Notes (Signed)
urinal

## 2016-04-15 NOTE — Progress Notes (Signed)
   Subjective:    Patient ID: Kaitlyn Cervantes is a 55 y.o. old female.  HPI #DM-2: Reports taking 1 g metformin twice a day. Recently started brisk walking about 30 minutes 3 times a week. She lost about 12 pounds over the last 8 months. Wants to do aerobics and resistance exercise. She wants to start this next week. No barrier. She is also watching her salt and cutting down on bread and starches.  Otherwise, she denies signs and symptoms of hypo-or hyperglycemia. Reports increased frequency of urination but think this is likely from urinary tract infection. Denies vision change or numbness or tingling. Has not been to eye doctor or dentist in the last two years.  #Dysuria: This has been going on for 2 weeks. Also reports fever, chill, cloudy urine, increased frequency of urination and occasional urge. She said that he home temperature was 70F.  No change over the last few days. Denies hematuria. Denies vaginal discharge or vaginal bleeding. No recent sex sexual intercourse. Denies abdominal, back or flank pain. She reports history of recurrent UTI she was last treated about 8 months ago. At that time urine culture grew Proteus mirabilis basically pansensitive.   PMH: reviewed  SH: denies smoking  Review of Systems Per HPI Objective:   Vitals:   04/15/16 0906  BP: 137/70  Pulse: 78  Temp: 98.7 F (37.1 C)  TempSrc: Oral  Weight: 219 lb (99.3 kg)  Height: 5\' 4"  (1.626 m)    GEN: appears well, no apparent distress. CVS: RRR, normal s1 and s2, no murmurs, no edema RESP: no increased work of breathing, good air movement bilaterally, no crackles or wheeze GI: Bowel sounds present and normal, soft, non-tender GU: No suprapubic or CVA tenderness NEURO: alert and oriented appropriately PSYCH: appropriate mood and affect     Assessment & Plan:  Type 2 diabetes mellitus, uncontrolled (HCC) A1c is stable at 7.3. Continue metformin 1 g twice a day. Already started walking to lose weight.  She has lost about 11 pounds over the last 8 months. Wants to start aerobics and resistance exercises next week. I have also discussed about portion size meals. Patient appears motivated to embark on action.  I also recommended going to the ophthalmologist and dentist as soon as possible A 7 point monofilament exam within normal limits. She has no lesion or callus. I recommended trimming her nails short.  Patient declines pneumonia vaccine saying that she has gotten it at [facility] about 3 months ago Patient planning to schedule appointment with PCP for annual physical. I recommended going over her goals at that visit.   UTI (urinary tract infection) Patient with clinical UTI. UA remarkable for large leukocyte Estrace. Has history of recurrent UTI -Keflex 500 mg 3 times a day for 5 days -Urine for urine culture  Declines a flu shot.

## 2016-04-15 NOTE — Assessment & Plan Note (Addendum)
A1c is stable at 7.3. Continue metformin 1 g twice a day. Already started walking to lose weight. She has lost about 11 pounds over the last 8 months. Wants to start aerobics and resistance exercises next week. I have also discussed about portion size meals. Patient appears motivated to embark on action.  I also recommended going to the ophthalmologist and dentist as soon as possible A 7 point monofilament exam within normal limits. She has no lesion or callus. I recommended trimming her nails short.  Patient declines pneumonia vaccine saying that she has gotten it at [facility] about 3 months ago Patient planning to schedule appointment with PCP for annual physical. I recommended going over her goals at that visit.

## 2016-04-15 NOTE — Patient Instructions (Addendum)
It was great seeing you today! We have addressed the following issues today  1. Diabetes: Your A1c stayed stable at 7.3. Your goal A1c is less than 7.0. We have set the following goals to manage your diabetes with lifestyle changes such as diet and exercise in addition to your medication:  aerobics and resistance exercise starting next week  Portion size meal I also recommend you see your eye doctor and dentist as soon as possible  Urinary tract infection: I have sent a prescription for Keflex to your pharmacy. Will start taking this medication as soon as possible. Take it until we have completed the course  Please come back and see your primary care doctor for his annual physical.     If we did any lab work today, and the results require attention, either me or my nurse will get in touch with you. If everything is normal, you will get a letter in mail. If you don't hear from [place] in two weeks, please give [place] a call. Otherwise, I look forward to talking with you again at our next visit. If you have any questions or concerns before then, please call the clinic at [phone].  Please bring all your medications to every doctors visit   Sign up for My Chart to have easy access to your labs results, and communication with your Primary care physician.    Please check-out at the front desk before leaving the clinic.   Take Care,

## 2016-04-15 NOTE — Assessment & Plan Note (Signed)
Patient with clinical UTI. UA remarkable for large leukocyte Estrace. Has history of recurrent UTI -Keflex 500 mg 3 times a day for 5 days -Urine for urine culture

## 2016-04-17 LAB — URINE CULTURE

## 2016-04-19 ENCOUNTER — Encounter: Payer: Commercial Managed Care - HMO | Admitting: Family Medicine

## 2016-04-19 NOTE — Progress Notes (Deleted)
   Subjective:  Chief complaint -- Annual Physical***  Pt Here for ***      Colonoscopy: overdue  Pneumonia Vaccine: {YES/NO/WILD [id number]  Mammogram: Last done in March 2017, has been diagnosed with Breast cancer in April 2017 Menopause: {YES/NO/WILD [id number]  Vaginal Bleeding: {YES/NO/WILD RC:4691767  Tobacco Use: {YES/NO/WILD CARDS:18581}   Alcohol Use: {YES/NO/WILD CARDS:18581}  Other Drugs: {YES/NO/WILD CARDS:18581}   Support Structure: {YES/NO/WILD RC:4691767  Life at Home: ***   ROS-  Past Medical History Patient Active Problem List   Diagnosis Date Noted  . UTI (urinary tract infection) 04/15/2016  . Breast cancer of upper-inner quadrant of right female breast ([place]) 10/19/2015  . Hematuria 08/31/2015  . Constipation 09/19/2014  . Kidney stone 09/19/2014  . Hydronephrosis 08/27/2014  . Other and unspecified hyperlipidemia 10/30/2013  . Type 2 diabetes mellitus, uncontrolled ([place]) 10/29/2013  . Essential hypertension, benign 10/29/2013  . History of obstructive sleep apnea 10/29/2013  . Anemia 10/29/2013  . Personal history of kidney stones 10/29/2013    Medications- reviewed and updated Current Outpatient Prescriptions  Medication Sig Dispense Refill  . aspirin EC 81 MG tablet Take 81 mg by mouth daily.    . cephALEXin (KEFLEX) 500 MG capsule Take 1 capsule (500 mg total) by mouth 3 (three) times daily. Take for 7 days 15 capsule 0  . Cholecalciferol (VITAMIN D3) 5000 units TABS Take 5,000 Units by mouth daily.    Marland Kitchen lisinopril (PRINIVIL,ZESTRIL) 40 MG tablet Take 1 tablet by mouth  daily 90 tablet 1  . metFORMIN (GLUCOPHAGE) 1000 MG tablet Take 1 tablet (1,000 mg total) by mouth 2 (two) times daily with a meal. 180 tablet 3  . Multiple Vitamin (MULTIVITAMIN WITH MINERALS) TABS tablet Take 1 tablet by mouth daily.    . NORVASC 10 MG tablet Take 1 tablet (10 mg total) by mouth daily. 90 tablet 3  . simvastatin (ZOCOR) 20 MG tablet Take 1 tablet (20  mg total) by mouth daily. (Patient not taking: Reported on 08/22/2014) 30 tablet 6  . sulfamethoxazole-trimethoprim (BACTRIM,SEPTRA) 400-80 MG tablet Take 1 tablet by mouth daily.      No current facility-administered medications for this visit.     Objective: There were no vitals taken for this visit. Gen: NAD, alert, cooperative with exam HEENT: NCAT, EOMI, PERRL CV: RRR, good S1/S2, no murmur Resp: CTABL, no wheezes, non-labored Abd: Soft, Non Tender, Non Distended, BS present, no guarding or organomegaly Ext: No edema, warm Neuro: Alert and oriented, No gross deficits   Assessment/Plan:  No problem-specific Assessment & Plan notes found for this encounter.   No orders of the defined types were placed in this encounter.   No orders of the defined types were placed in this encounter.    Smitty Cords, MD [place], PGY-2

## 2016-05-02 ENCOUNTER — Telehealth: Payer: Self-pay | Admitting: Family Medicine

## 2016-05-02 DIAGNOSIS — N390 Urinary tract infection, site not specified: Secondary | ICD-10-CM

## 2016-05-02 NOTE — Telephone Encounter (Signed)
Pt called the nurse line and was upset that she didn't get to speak to a nurse. She is wanting to speak to a nurse

## 2016-05-02 NOTE — Telephone Encounter (Signed)
Patient called requesting urine culture results from last office visit.  Precept with Dr. Gwendlyn Cervantes; reviewed results with patient. Patient scheduled a lab appointment for repeat urine culture.  Will forward chart to Dr. Gwendlyn Cervantes.  Derl Barrow, RN

## 2016-05-02 NOTE — Telephone Encounter (Signed)
Would like test results from Urine Culture done.

## 2016-05-02 NOTE — Telephone Encounter (Signed)
Patient recently seen by Dr. Cyndia Skeeters for UTI symptoms. Calling for culture report. Result discussed with her. She stated she completed her course of Keflex and she is still having symptoms. I advised her to come in today for repeat urine culture but she prefer to schedule lab appointment later. Urine culture ordered. Tamika will schedule lab appointment. F/U soon if symptoms worsens.

## 2016-05-02 NOTE — Telephone Encounter (Signed)
See previous note. Looks like pt was already informed and more labs were ordered. Katharina Caper, Kenechukwu Eckstein D, [place]

## 2016-05-02 NOTE — Telephone Encounter (Signed)
Routing to Dr who saw pt. Katharina Caper, Isael Stille D, [place]

## 2016-05-04 ENCOUNTER — Other Ambulatory Visit: Payer: Commercial Managed Care - HMO

## 2016-05-04 DIAGNOSIS — N390 Urinary tract infection, site not specified: Secondary | ICD-10-CM

## 2016-05-07 LAB — URINE CULTURE: Colony Count: >=100000

## 2016-05-09 ENCOUNTER — Telehealth: Payer: Self-pay | Admitting: Family Medicine

## 2016-05-09 MED ORDER — SULFAMETHOXAZOLE-TRIMETHOPRIM 800-160 MG PO TABS: 1.0000 | ORAL_TABLET | Freq: Two times a day (BID) | ORAL | 0 refills | Status: AC

## 2016-05-09 NOTE — Telephone Encounter (Signed)
Received repeat urine culture results in my inbox. It appears as though patient still has Proteus in her urine even after Keflex. Called patient and she is still having some cloudy urine and some dysuria. Bacteria sensitive to Bactrim.Will order 7 day course of Bactrim for patient. Called patient and explained results and plan. Patient agreeable and appreciative of the call. Strict return precautions given to patient if she has fever or worsening symptoms. She will call if her symptoms do not start to improve over the next couple days on Bactrim.   Smitty Cords, MD [place], PGY-2

## 2016-06-07 DIAGNOSIS — Z170 Estrogen receptor positive status [ER+]: Secondary | ICD-10-CM | POA: Insufficient documentation

## 2016-06-07 DIAGNOSIS — C50211 Malignant neoplasm of upper-inner quadrant of right female breast: Secondary | ICD-10-CM | POA: Insufficient documentation

## 2016-07-15 DIAGNOSIS — I509 Heart failure, unspecified: Secondary | ICD-10-CM | POA: Diagnosis not present

## 2016-07-15 DIAGNOSIS — E119 Type 2 diabetes mellitus without complications: Secondary | ICD-10-CM | POA: Diagnosis not present

## 2016-07-15 DIAGNOSIS — I10 Essential (primary) hypertension: Secondary | ICD-10-CM | POA: Diagnosis not present

## 2016-07-15 DIAGNOSIS — C50211 Malignant neoplasm of upper-inner quadrant of right female breast: Secondary | ICD-10-CM | POA: Diagnosis not present

## 2016-07-20 DIAGNOSIS — Z170 Estrogen receptor positive status [ER+]: Secondary | ICD-10-CM | POA: Diagnosis not present

## 2016-07-20 DIAGNOSIS — I509 Heart failure, unspecified: Secondary | ICD-10-CM | POA: Diagnosis not present

## 2016-07-20 DIAGNOSIS — I110 Hypertensive heart disease with heart failure: Secondary | ICD-10-CM | POA: Diagnosis not present

## 2016-07-20 DIAGNOSIS — Z853 Personal history of malignant neoplasm of breast: Secondary | ICD-10-CM | POA: Diagnosis not present

## 2016-07-20 DIAGNOSIS — C50211 Malignant neoplasm of upper-inner quadrant of right female breast: Secondary | ICD-10-CM | POA: Diagnosis not present

## 2016-07-21 DIAGNOSIS — Z171 Estrogen receptor negative status [ER-]: Secondary | ICD-10-CM | POA: Diagnosis not present

## 2016-07-21 DIAGNOSIS — R590 Localized enlarged lymph nodes: Secondary | ICD-10-CM | POA: Diagnosis not present

## 2016-07-21 DIAGNOSIS — I10 Essential (primary) hypertension: Secondary | ICD-10-CM | POA: Diagnosis not present

## 2016-07-21 DIAGNOSIS — C50211 Malignant neoplasm of upper-inner quadrant of right female breast: Secondary | ICD-10-CM | POA: Diagnosis not present

## 2016-07-21 DIAGNOSIS — C50911 Malignant neoplasm of unspecified site of right female breast: Secondary | ICD-10-CM | POA: Diagnosis not present

## 2016-07-21 DIAGNOSIS — Z170 Estrogen receptor positive status [ER+]: Secondary | ICD-10-CM | POA: Diagnosis not present

## 2016-07-21 DIAGNOSIS — N6011 Diffuse cystic mastopathy of right breast: Secondary | ICD-10-CM | POA: Diagnosis not present

## 2016-07-21 DIAGNOSIS — E119 Type 2 diabetes mellitus without complications: Secondary | ICD-10-CM | POA: Diagnosis not present

## 2016-07-21 DIAGNOSIS — G8918 Other acute postprocedural pain: Secondary | ICD-10-CM | POA: Diagnosis not present

## 2016-08-01 ENCOUNTER — Encounter: Payer: Self-pay | Admitting: Family Medicine

## 2016-08-01 DIAGNOSIS — I509 Heart failure, unspecified: Secondary | ICD-10-CM | POA: Insufficient documentation

## 2016-08-13 ENCOUNTER — Other Ambulatory Visit: Payer: Self-pay | Admitting: Family Medicine

## 2016-08-17 DIAGNOSIS — Z170 Estrogen receptor positive status [ER+]: Secondary | ICD-10-CM | POA: Diagnosis not present

## 2016-08-17 DIAGNOSIS — C50211 Malignant neoplasm of upper-inner quadrant of right female breast: Secondary | ICD-10-CM | POA: Diagnosis not present

## 2016-08-30 DIAGNOSIS — C50211 Malignant neoplasm of upper-inner quadrant of right female breast: Secondary | ICD-10-CM | POA: Diagnosis not present

## 2016-08-30 DIAGNOSIS — Z510 Encounter for antineoplastic radiation therapy: Secondary | ICD-10-CM | POA: Diagnosis not present

## 2016-09-02 DIAGNOSIS — C50211 Malignant neoplasm of upper-inner quadrant of right female breast: Secondary | ICD-10-CM | POA: Diagnosis not present

## 2016-09-12 DIAGNOSIS — C50211 Malignant neoplasm of upper-inner quadrant of right female breast: Secondary | ICD-10-CM | POA: Diagnosis not present

## 2016-09-12 DIAGNOSIS — Z510 Encounter for antineoplastic radiation therapy: Secondary | ICD-10-CM | POA: Diagnosis not present

## 2016-09-15 DIAGNOSIS — C50911 Malignant neoplasm of unspecified site of right female breast: Secondary | ICD-10-CM | POA: Diagnosis not present

## 2016-09-15 DIAGNOSIS — Z171 Estrogen receptor negative status [ER-]: Secondary | ICD-10-CM | POA: Diagnosis not present

## 2016-09-15 DIAGNOSIS — I10 Essential (primary) hypertension: Secondary | ICD-10-CM | POA: Diagnosis not present

## 2016-09-15 DIAGNOSIS — C50211 Malignant neoplasm of upper-inner quadrant of right female breast: Secondary | ICD-10-CM | POA: Diagnosis not present

## 2016-09-15 DIAGNOSIS — R0789 Other chest pain: Secondary | ICD-10-CM | POA: Diagnosis not present

## 2016-10-19 DIAGNOSIS — E119 Type 2 diabetes mellitus without complications: Secondary | ICD-10-CM | POA: Diagnosis not present

## 2016-10-19 DIAGNOSIS — C50211 Malignant neoplasm of upper-inner quadrant of right female breast: Secondary | ICD-10-CM | POA: Diagnosis not present

## 2016-10-19 DIAGNOSIS — I10 Essential (primary) hypertension: Secondary | ICD-10-CM | POA: Diagnosis not present

## 2016-10-21 DIAGNOSIS — I5032 Chronic diastolic (congestive) heart failure: Secondary | ICD-10-CM | POA: Diagnosis not present

## 2016-11-07 ENCOUNTER — Telehealth: Payer: Self-pay | Admitting: Oncology

## 2016-11-07 NOTE — Telephone Encounter (Signed)
lvm to inform pt of genetics appt 5/21 at 0900 per LOS

## 2016-11-10 DIAGNOSIS — R609 Edema, unspecified: Secondary | ICD-10-CM | POA: Diagnosis not present

## 2016-11-10 DIAGNOSIS — Z171 Estrogen receptor negative status [ER-]: Secondary | ICD-10-CM | POA: Diagnosis not present

## 2016-11-10 DIAGNOSIS — D0511 Intraductal carcinoma in situ of right breast: Secondary | ICD-10-CM | POA: Diagnosis not present

## 2016-11-10 DIAGNOSIS — I10 Essential (primary) hypertension: Secondary | ICD-10-CM | POA: Diagnosis not present

## 2016-11-10 DIAGNOSIS — Z853 Personal history of malignant neoplasm of breast: Secondary | ICD-10-CM | POA: Diagnosis not present

## 2016-11-10 DIAGNOSIS — C50211 Malignant neoplasm of upper-inner quadrant of right female breast: Secondary | ICD-10-CM | POA: Diagnosis not present

## 2016-11-24 ENCOUNTER — Encounter: Payer: Self-pay | Admitting: Genetic Counselor

## 2016-11-28 ENCOUNTER — Encounter: Payer: Commercial Managed Care - HMO | Admitting: Genetic Counselor

## 2016-11-28 ENCOUNTER — Encounter: Payer: Self-pay | Admitting: Genetic Counselor

## 2016-11-28 ENCOUNTER — Other Ambulatory Visit: Payer: Commercial Managed Care - HMO

## 2016-11-28 DIAGNOSIS — Z1379 Encounter for other screening for genetic and chromosomal anomalies: Secondary | ICD-10-CM | POA: Insufficient documentation

## 2016-12-01 DIAGNOSIS — C50911 Malignant neoplasm of unspecified site of right female breast: Secondary | ICD-10-CM | POA: Diagnosis not present

## 2016-12-13 ENCOUNTER — Ambulatory Visit: Payer: Self-pay | Admitting: Obstetrics & Gynecology

## 2016-12-21 ENCOUNTER — Encounter: Payer: Self-pay | Admitting: Obstetrics & Gynecology

## 2016-12-21 ENCOUNTER — Telehealth: Payer: Self-pay | Admitting: *Deleted

## 2016-12-21 ENCOUNTER — Ambulatory Visit (INDEPENDENT_AMBULATORY_CARE_PROVIDER_SITE_OTHER): Payer: Commercial Managed Care - HMO | Admitting: Obstetrics & Gynecology

## 2016-12-21 ENCOUNTER — Encounter: Payer: Self-pay | Admitting: Gastroenterology

## 2016-12-21 VITALS — BP 142/82 | Ht 64.0 in | Wt 217.0 lb

## 2016-12-21 DIAGNOSIS — Z01411 Encounter for gynecological examination (general) (routine) with abnormal findings: Secondary | ICD-10-CM

## 2016-12-21 DIAGNOSIS — Z780 Asymptomatic menopausal state: Secondary | ICD-10-CM

## 2016-12-21 DIAGNOSIS — Z01419 Encounter for gynecological examination (general) (routine) without abnormal findings: Secondary | ICD-10-CM | POA: Diagnosis not present

## 2016-12-21 DIAGNOSIS — E669 Obesity, unspecified: Secondary | ICD-10-CM | POA: Diagnosis not present

## 2016-12-21 DIAGNOSIS — N952 Postmenopausal atrophic vaginitis: Secondary | ICD-10-CM

## 2016-12-21 DIAGNOSIS — Z1151 Encounter for screening for human papillomavirus (HPV): Secondary | ICD-10-CM

## 2016-12-21 DIAGNOSIS — Z1211 Encounter for screening for malignant neoplasm of colon: Secondary | ICD-10-CM

## 2016-12-21 DIAGNOSIS — Z853 Personal history of malignant neoplasm of breast: Secondary | ICD-10-CM

## 2016-12-21 NOTE — Telephone Encounter (Signed)
Dr.Lavoie sent a staff message for pt to schedule screening colonoscopy, referral placed at [facility].

## 2016-12-21 NOTE — Progress Notes (Signed)
Kaitlyn Cervantes 16-Apr-1961 [id number]   History:    56 y.o.  G3P3  Widowed x about 1 year  RP:  New patient presenting for annual gyn exam   HPI:  Menopause.  No HRT.  No PMB.  No pelvic pain.  Rt Breast Ca, Chemo and Rt Mastectomy 07/2016.  Left Breast Mammo 11/2016 neg.  BrCa1-2 neg per patient.  Many Medical issues followed by Fam MD.  BMI 37.25.  Mictions/BMs wnl.  Past medical history,surgical history, family history and social history were all reviewed and documented in the EPIC chart.  Gynecologic History No LMP recorded. Patient is postmenopausal. Contraception: abstinence and post menopausal status Last Pap: About 2015. Results were: normal per patient Last mammogram: 11/2016. Results were: Left breast neg.  S/P Rt Mastectomy  Obstetric History OB History  Gravida Para Term Preterm AB Living  _0 SAB TAB Ectopic Multiple Live Births               # Outcome Date GA Lbr Len/2nd Weight Sex Delivery Anes PTL Lv  3 Para           2 Para           1 Para                ROS: A ROS was performed and pertinent positives and negatives are included in the history.  GENERAL: No fevers or chills. HEENT: No change in vision, no earache, sore throat or sinus congestion. NECK: No pain or stiffness. CARDIOVASCULAR: No chest pain or pressure. No palpitations. PULMONARY: No shortness of breath, cough or wheeze. GASTROINTESTINAL: No abdominal pain, nausea, vomiting or diarrhea, melena or bright red blood per rectum. GENITOURINARY: No urinary frequency, urgency, hesitancy or dysuria. MUSCULOSKELETAL: No joint or muscle pain, no back pain, no recent trauma. DERMATOLOGIC: No rash, no itching, no lesions. ENDOCRINE: No polyuria, polydipsia, no heat or cold intolerance. No recent change in weight. HEMATOLOGICAL: No anemia or easy bruising or bleeding. NEUROLOGIC: No headache, seizures, numbness, tingling or weakness. PSYCHIATRIC: No depression, no loss of interest in normal activity or  change in sleep pattern.     Exam:   There were no vitals taken for this visit.  There is no height or weight on file to calculate BMI.  General appearance : Well developed well nourished female. No acute distress HEENT: Eyes: no retinal hemorrhage or exudates,  Neck supple, trachea midline, no carotid bruits, no thyroidmegaly Lungs: Clear to auscultation, no rhonchi or wheezes, or rib retractions  Heart: Regular rate and rhythm, no murmurs or gallops Breast:Examined in sitting and supine position.  S/P Rt Mastectomy.  Lt breast, no palpable masses or tenderness,  no skin retraction, no nipple inversion, no nipple discharge, no skin discoloration.  No axillary or supraclavicular lymphadenopathy bilaterally. Abdomen: no palpable masses or tenderness, no rebound or guarding.  Mild midline hernia. Extremities: no edema or skin discoloration or tenderness  Pelvic:  Bartholin, Urethra, Skene Glands: Within normal limits             Vagina: No gross lesions or discharge  Cervix: No gross lesions or discharge.  Pap/HPV HR done.  Uterus  AV, normal size, shape and consistency, non-tender and mobile  Adnexa  Without masses or tenderness  Anus and perineum  normal    Assessment/Plan:  56 y.o. female for annual exam   1. Encounter for gynecological examination with abnormal finding Normal gyn  exam except Atrophic vaginitis and s/p Rt Mastectomy.  Pap/HPV HR done.  Will organize screening Colono. - Vitamin D 1,25 dihydroxy  2. History of right breast cancer S/P Rt Mastectomy.  Recent Lt breast screening Mammo neg.  3. Menopause present No HRT.  No PMB.  Abstinent  4. Post-menopausal atrophic vaginitis ASxic.  5. Obesity (BMI 35.0-39.9 without comorbidity) Low carb diet and physical activity reviewed.  Counseling on above issues >50% x 10 minutes.  Princess Bruins MD, 10:09 AM 12/21/2016

## 2016-12-21 NOTE — Patient Instructions (Signed)
1. Encounter for gynecological examination with abnormal finding Normal gyn exam except Atrophic vaginitis and s/p Rt Mastectomy.  Pap/HPV HR done.  Will organize screening Colono. - Vitamin D 1,25 dihydroxy  2. History of right breast cancer S/P Rt Mastectomy.  Recent Lt breast screening Mammo neg.  3. Menopause present No HRT.  No PMB.  Abstinent  4. Post-menopausal atrophic vaginitis ASxic.  5. Obesity (BMI 35.0-39.9 without comorbidity) Low carb diet and physical activity reviewed.  [place], it was a pleasure to meet you today!  I will inform you of your results as soon as available.  Health Maintenance, Female Adopting a healthy lifestyle and getting preventive care can go a long way to promote health and wellness. Talk with your health care provider about what schedule of regular examinations is right for you. This is a good chance for you to check in with your provider about disease prevention and staying healthy. In between checkups, there are plenty of things you can do on your own. Experts have done a lot of research about which lifestyle changes and preventive measures are most likely to keep you healthy. Ask your health care provider for more information. Weight and diet Eat a healthy diet  Be sure to include plenty of vegetables, fruits, low-fat dairy products, and lean protein.  Do not eat a lot of foods high in solid fats, added sugars, or salt.  Get regular exercise. This is one of the most important things you can do for your health. ? Most adults should exercise for at least 150 minutes each week. The exercise should increase your heart rate and make you sweat (moderate-intensity exercise). ? Most adults should also do strengthening exercises at least twice a week. This is in addition to the moderate-intensity exercise.  Maintain a healthy weight  Body mass index (BMI) is a measurement that can be used to identify possible weight problems. It estimates body fat  based on height and weight. Your health care provider can help determine your BMI and help you achieve or maintain a healthy weight.  For females 55 years of age and older: ? A BMI below 18.5 is considered underweight. ? A BMI of 18.5 to 24.9 is normal. ? A BMI of 25 to 29.9 is considered overweight. ? A BMI of 30 and above is considered obese.  Watch levels of cholesterol and blood lipids  You should start having your blood tested for lipids and cholesterol at 57 years of age, then have this test every 5 years.  You may need to have your cholesterol levels checked more often if: ? Your lipid or cholesterol levels are high. ? You are older than 57 years of age. ? You are at high risk for heart disease.  Cancer screening Lung Cancer  Lung cancer screening is recommended for adults 41-84 years old who are at high risk for lung cancer because of a history of smoking.  A yearly low-dose CT scan of the lungs is recommended for people who: ? Currently smoke. ? Have quit within the past 15 years. ? Have at least a 30-pack-year history of smoking. A pack year is smoking an average of one pack of cigarettes a day for 1 year.  Yearly screening should continue until it has been 15 years since you quit.  Yearly screening should stop if you develop a health problem that would prevent you from having lung cancer treatment.  Breast Cancer  Practice breast self-awareness. This means understanding how your breasts normally  appear and feel.  It also means doing regular breast self-exams. Let your health care provider know about any changes, no matter how small.  If you are in your 20s or 30s, you should have a clinical breast exam (CBE) by a health care provider every 1-3 years as part of a regular health exam.  If you are 83 or older, have a CBE every year. Also consider having a breast X-ray (mammogram) every year.  If you have a family history of breast cancer, talk to your health care  provider about genetic screening.  If you are at high risk for breast cancer, talk to your health care provider about having an MRI and a mammogram every year.  Breast cancer gene (BRCA) assessment is recommended for women who have family members with BRCA-related cancers. BRCA-related cancers include: ? Breast. ? Ovarian. ? Tubal. ? Peritoneal cancers.  Results of the assessment will determine the need for genetic counseling and BRCA1 and BRCA2 testing.  Cervical Cancer Your health care provider may recommend that you be screened regularly for cancer of the pelvic organs (ovaries, uterus, and vagina). This screening involves a pelvic examination, including checking for microscopic changes to the surface of your cervix (Pap test). You may be encouraged to have this screening done every 3 years, beginning at age 55.  For women ages 73-65, health care providers may recommend pelvic exams and Pap testing every 3 years, or they may recommend the Pap and pelvic exam, combined with testing for human papilloma virus (HPV), every 5 years. Some types of HPV increase your risk of cervical cancer. Testing for HPV may also be done on women of any age with unclear Pap test results.  Other health care providers may not recommend any screening for nonpregnant women who are considered low risk for pelvic cancer and who do not have symptoms. Ask your health care provider if a screening pelvic exam is right for you.  If you have had past treatment for cervical cancer or a condition that could lead to cancer, you need Pap tests and screening for cancer for at least 20 years after your treatment. If Pap tests have been discontinued, your risk factors (such as having a new sexual partner) need to be reassessed to determine if screening should resume. Some women have medical problems that increase the chance of getting cervical cancer. In these cases, your health care provider may recommend more frequent screening and  Pap tests.  Colorectal Cancer  This type of cancer can be detected and often prevented.  Routine colorectal cancer screening usually begins at 57 years of age and continues through 57 years of age.  Your health care provider may recommend screening at an earlier age if you have risk factors for colon cancer.  Your health care provider may also recommend using home test kits to check for hidden blood in the stool.  A small camera at the end of a tube can be used to examine your colon directly (sigmoidoscopy or colonoscopy). This is done to check for the earliest forms of colorectal cancer.  Routine screening usually begins at age 22.  Direct examination of the colon should be repeated every 5-10 years through 57 years of age. However, you may need to be screened more often if early forms of precancerous polyps or small growths are found.  Skin Cancer  Check your skin from head to toe regularly.  Tell your health care provider about any new moles or changes in moles, especially  if there is a change in a mole's shape or color.  Also tell your health care provider if you have a mole that is larger than the size of a pencil eraser.  Always use sunscreen. Apply sunscreen liberally and repeatedly throughout the day.  Protect yourself by wearing long sleeves, pants, a wide-brimmed hat, and sunglasses whenever you are outside.  Heart disease, diabetes, and high blood pressure  High blood pressure causes heart disease and increases the risk of stroke. High blood pressure is more likely to develop in: ? People who have blood pressure in the high end of the normal range (130-139/85-89 mm Hg). ? People who are overweight or obese. ? People who are African American.  If you are 72-38 years of age, have your blood pressure checked every 3-5 years. If you are 13 years of age or older, have your blood pressure checked every year. You should have your blood pressure measured twice-once when you  are at a hospital or clinic, and once when you are not at a hospital or clinic. Record the average of the two measurements. To check your blood pressure when you are not at a hospital or clinic, you can use: ? An automated blood pressure machine at a pharmacy. ? A home blood pressure monitor.  If you are between 2 years and 62 years old, ask your health care provider if you should take aspirin to prevent strokes.  Have regular diabetes screenings. This involves taking a blood sample to check your fasting blood sugar level. ? If you are at a normal weight and have a low risk for diabetes, have this test once every three years after 57 years of age. ? If you are overweight and have a high risk for diabetes, consider being tested at a younger age or more often. Preventing infection Hepatitis B  If you have a higher risk for hepatitis B, you should be screened for this virus. You are considered at high risk for hepatitis B if: ? You were born in a country where hepatitis B is common. Ask your health care provider which countries are considered high risk. ? Your parents were born in a high-risk country, and you have not been immunized against hepatitis B (hepatitis B vaccine). ? You have HIV or AIDS. ? You use needles to inject street drugs. ? You live with someone who has hepatitis B. ? You have had sex with someone who has hepatitis B. ? You get hemodialysis treatment. ? You take certain medicines for conditions, including cancer, organ transplantation, and autoimmune conditions.  Hepatitis C  Blood testing is recommended for: ? Everyone born from [birth year] through 1966. ? Anyone with known risk factors for hepatitis C.  Sexually transmitted infections (STIs)  You should be screened for sexually transmitted infections (STIs) including gonorrhea and chlamydia if: ? You are sexually active and are younger than 57 years of age. ? You are older than 57 years of age and your health care provider  tells you that you are at risk for this type of infection. ? Your sexual activity has changed since you were last screened and you are at an increased risk for chlamydia or gonorrhea. Ask your health care provider if you are at risk.  If you do not have HIV, but are at risk, it may be recommended that you take a prescription medicine daily to prevent HIV infection. This is called pre-exposure prophylaxis (PrEP). You are considered at risk if: ? You are sexually active  and do not regularly use condoms or know the HIV status of your partner(s). ? You take drugs by injection. ? You are sexually active with a partner who has HIV.  Talk with your health care provider about whether you are at high risk of being infected with HIV. If you choose to begin PrEP, you should first be tested for HIV. You should then be tested every 3 months for as long as you are taking PrEP. Pregnancy  If you are premenopausal and you may become pregnant, ask your health care provider about preconception counseling.  If you may become pregnant, take 400 to 800 micrograms (mcg) of folic acid every day.  If you want to prevent pregnancy, talk to your health care provider about birth control (contraception). Osteoporosis and menopause  Osteoporosis is a disease in which the bones lose minerals and strength with aging. This can result in serious bone fractures. Your risk for osteoporosis can be identified using a bone density scan.  If you are 47 years of age or older, or if you are at risk for osteoporosis and fractures, ask your health care provider if you should be screened.  Ask your health care provider whether you should take a calcium or vitamin D supplement to lower your risk for osteoporosis.  Menopause may have certain physical symptoms and risks.  Hormone replacement therapy may reduce some of these symptoms and risks. Talk to your health care provider about whether hormone replacement therapy is right for  you. Follow these instructions at home:  Schedule regular health, dental, and eye exams.  Stay current with your immunizations.  Do not use any tobacco products including cigarettes, chewing tobacco, or electronic cigarettes.  If you are pregnant, do not drink alcohol.  If you are breastfeeding, limit how much and how often you drink alcohol.  Limit alcohol intake to no more than 1 drink per day for nonpregnant women. One drink equals 12 ounces of beer, 5 ounces of wine, or 1 ounces of hard liquor.  Do not use street drugs.  Do not share needles.  Ask your health care provider for help if you need support or information about quitting drugs.  Tell your health care provider if you often feel depressed.  Tell your health care provider if you have ever been abused or do not feel safe at home. This information is not intended to replace advice given to you by your health care provider. Make sure you discuss any questions you have with your health care provider. Document Released: 01/10/2011 Document Revised: 12/03/2015 Document Reviewed: 03/31/2015 Elsevier Interactive Patient Education  [employer].

## 2016-12-21 NOTE — Addendum Note (Signed)
Addended by: Alen Blew on: 12/21/2016 10:57 AM   Modules accepted: Orders

## 2016-12-23 LAB — PAP IG AND HPV HIGH-RISK: HPV DNA High Risk: NOT DETECTED

## 2016-12-24 LAB — VITAMIN D 1,25 DIHYDROXY
Vitamin D 1, 25 (OH)2 Total: 61 pg/mL (ref 18–72)
Vitamin D2 1, 25 (OH)2: <8 pg/mL
Vitamin D3 1, 25 (OH)2: 61 pg/mL

## 2016-12-27 NOTE — Telephone Encounter (Signed)
Appointment on 02/16/17

## 2016-12-28 DIAGNOSIS — C50911 Malignant neoplasm of unspecified site of right female breast: Secondary | ICD-10-CM | POA: Diagnosis not present

## 2016-12-29 DIAGNOSIS — C50911 Malignant neoplasm of unspecified site of right female breast: Secondary | ICD-10-CM | POA: Diagnosis not present

## 2016-12-31 ENCOUNTER — Emergency Department (HOSPITAL_COMMUNITY)
Admission: EM | Admit: 2016-12-31 | Discharge: 2016-12-31 | Disposition: A | Discharge status: Home / Self Care | Payer: Commercial Managed Care - HMO | Attending: Emergency Medicine | Admitting: Emergency Medicine

## 2016-12-31 ENCOUNTER — Emergency Department (HOSPITAL_COMMUNITY): Payer: Commercial Managed Care - HMO

## 2016-12-31 ENCOUNTER — Encounter (HOSPITAL_COMMUNITY): Payer: Self-pay | Admitting: Emergency Medicine

## 2016-12-31 DIAGNOSIS — Z853 Personal history of malignant neoplasm of breast: Secondary | ICD-10-CM | POA: Diagnosis not present

## 2016-12-31 DIAGNOSIS — Z79899 Other long term (current) drug therapy: Secondary | ICD-10-CM | POA: Diagnosis not present

## 2016-12-31 DIAGNOSIS — E119 Type 2 diabetes mellitus without complications: Secondary | ICD-10-CM | POA: Diagnosis not present

## 2016-12-31 DIAGNOSIS — I509 Heart failure, unspecified: Secondary | ICD-10-CM | POA: Diagnosis not present

## 2016-12-31 DIAGNOSIS — R51 Headache: Secondary | ICD-10-CM | POA: Diagnosis not present

## 2016-12-31 DIAGNOSIS — I110 Hypertensive heart disease with heart failure: Secondary | ICD-10-CM | POA: Diagnosis not present

## 2016-12-31 DIAGNOSIS — Z7984 Long term (current) use of oral hypoglycemic drugs: Secondary | ICD-10-CM | POA: Insufficient documentation

## 2016-12-31 DIAGNOSIS — H1131 Conjunctival hemorrhage, right eye: Secondary | ICD-10-CM | POA: Diagnosis not present

## 2016-12-31 DIAGNOSIS — R519 Headache, unspecified: Secondary | ICD-10-CM

## 2016-12-31 MED ORDER — ACETAMINOPHEN 500 MG PO TABS
1000.0000 mg | ORAL_TABLET | Freq: Once | ORAL | Status: AC
  Administered 2016-12-31: 1000 mg via ORAL
  Filled 2016-12-31: qty 2

## 2016-12-31 NOTE — ED Notes (Signed)
Pt presenting with eye redness and irritation not related to injury onset 17:00 tonight.

## 2016-12-31 NOTE — ED Notes (Signed)
Visual Acuity - Bilateral Near: 20/25 R Near: 20/40 L Near: 20/40

## 2016-12-31 NOTE — ED Triage Notes (Signed)
Pt comes in with complaints of a headache that began around 1700 this evening. Pt also has blood noted to her right sclera. Pt states she may be having some visual changes as she can't see small print as she normally does.  Ambulatory. A&O x4. No other complaints.

## 2016-12-31 NOTE — Discharge Instructions (Signed)
Continue taking your blood pressure medications as prescribed. You may take 400 mg of ibuprofen or 500 mg of Tylenol as needed for headache. Follow-up with your primary care physician for reevaluation this week. Return to the ED if any concerning signs or symptoms develop including fevers, chills, severe headache, vision changes, facial asymmetry, weakness, or difficulty walking or talking.

## 2016-12-31 NOTE — ED Provider Notes (Signed)
[facility] DEPT Provider Note   CSN: [id number] Arrival date & time: 12/31/16  2025     History   Chief Complaint Chief Complaint  Patient presents with  . Headache  . Blood on Sclera    HPI Kaitlyn Cervantes is a 56 y.o. 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 She states that around 5 PM this evening she was having dinner when she developed a right-sided periorbital 6/10 dull headache which did not radiate. She states headache lasted for approximately 5 minutes before alleviating. She states "I felt weird and felt like I couldn't move my face to the left" during this time. She denies photophobia, phonophobia, aphasia, numbness, tingling, or weakness. She denies trauma or falls. She notes that after this 5 minute episode her daughter noted blood to her right sclera. Patient denies diplopia, but states she notes slight blurry vision when reading. She now notes "mild irritation"on movement of the right eye. Currently she endorses a 5/10 dull frontal headache. She has had headaches like this in the past. She has not tried anything for her symptoms. No aggravating or alleviating factors noted. Of note, patient states she has not had any of her blood pressure medications this evening.  The history is provided by the patient.    Past Medical History:  Diagnosis Date  . Anemia 2009  . Blood transfusion without reported diagnosis 2009  . Breast cancer of upper-inner quadrant of right female breast ([facility]) 10/19/2015   BRCA I & II negative  . Diabetes mellitus without complication ([facility])   . Hypertension   . Kidney stones   . Sleep apnea 2005    Patient Active Problem List   Diagnosis Date Noted  . Genetic testing 11/28/2016  . Congestive heart failure ([facility]) 08/01/2016  . UTI  (urinary tract infection) 04/15/2016  . Breast cancer of upper-inner quadrant of right female breast ([facility]) 10/19/2015  . Hematuria 08/31/2015  . Constipation 09/19/2014  . Kidney stone 09/19/2014  . Hydronephrosis 08/27/2014  . Other and unspecified hyperlipidemia 10/30/2013  . Type 2 diabetes mellitus, uncontrolled ([facility]) 10/29/2013  . Essential hypertension, benign 10/29/2013  . History of obstructive sleep apnea 10/29/2013  . Anemia 10/29/2013  . Personal history of kidney stones 10/29/2013    Past Surgical History:  Procedure Laterality Date  . CESAREAN SECTION     x3  . kidney stones  2007  . right mastectomy     07/2016  . SPLENECTOMY     age 21     OB History    Gravida Para Term Preterm AB Living   '3 3       3   '$ SAB TAB Ectopic Multiple Live Births                   Home Medications    Prior to Admission medications   Medication Sig Start Date End Date Taking? Authorizing Provider  bumetanide (BUMEX) 1 MG tablet Take 1 mg by mouth daily.  07/15/16  Yes [provider]  carvedilol (COREG) 6.25 MG tablet Take 6.25 mg by mouth 2 (two) times daily with a meal.  01/01/16  Yes [provider]  Cholecalciferol (VITAMIN D3) 5000 units TABS Take 5,000 Units by mouth daily.   Yes [provider]  lisinopril (PRINIVIL,ZESTRIL) 40 MG tablet TAKE ONE TABLET  BY MOUTH ONCE DAILY 08/15/16  Yes Carlyle Dolly, MD  metFORMIN (GLUCOPHAGE) 1000 MG tablet Take 1 tablet (1,000 mg total) by mouth 2 (two) times daily with a meal. 04/15/16  Yes Mercy Riding, MD  Multiple Vitamin (MULTIVITAMIN WITH MINERALS) TABS tablet Take 1 tablet by mouth daily.   Yes [provider]  NORVASC 10 MG tablet Take 1 tablet (10 mg total) by mouth daily. 02/03/16  Yes Dickie La, MD  spironolactone (ALDACTONE) 25 MG tablet Take 25 mg by mouth daily.  01/01/16  Yes [provider]  lidocaine-prilocaine (EMLA) cream Apply thin layer to skin as needed before treatment  05/11/16   [provider]    Family History Family History  Problem Relation Age of Onset  . Cancer Mother   . Breast cancer Mother        x2 51's & 72's  . Heart disease Father   . Cancer Sister   . Cancer Maternal Aunt   . Breast cancer Maternal Aunt        mid 27's    Social History Social History  Substance Use Topics  . Smoking status: Never Smoker  . Smokeless tobacco: Never Used  . Alcohol use No     Comment: very rare     Allergies   Iohexol and Other   Review of Systems Review of Systems  Constitutional: Negative for chills and fever.  HENT: Negative for facial swelling.   Eyes: Positive for redness and visual disturbance. Negative for photophobia.  Respiratory: Negative for shortness of breath.   Cardiovascular: Negative for chest pain.  Gastrointestinal: Negative for abdominal pain, nausea and vomiting.  Musculoskeletal: Negative for neck pain and neck stiffness.  Neurological: Positive for headaches. Negative for syncope, facial asymmetry, weakness, light-headedness and numbness.  Psychiatric/Behavioral: Negative for confusion.  All other systems reviewed and are negative.    Physical Exam Updated Vital Signs BP (!) 138/56 (BP Location: Left Arm)   Pulse 79   Temp 98.2 F (36.8 C) (Oral)   Resp 18   Ht '5\' 4"'$  (1.626 m)   Wt 98.4 kg (217 lb)   SpO2 95%   BMI 37.25 kg/m   Physical Exam  Constitutional: She is oriented to person, place, and time. She appears well-developed and well-nourished. No distress.  HENT:  Head: Normocephalic and atraumatic.  Right Ear: External ear normal.  Left Ear: External ear normal.  Mouth/Throat: Oropharynx is clear and moist.  No tenderness to palpation of the face or skull, no deformity or crepitus noted  Eyes: EOM are normal. Pupils are equal, round, and reactive to light. Right eye exhibits no discharge. Left eye exhibits no discharge. No scleral icterus.  No swelling or erythema or tenderness to  the eyelids. There is a moderate subconjunctival hemorrhage to the right eye on the nasal side. No consensual photophobia or foreign bodies visualized. No hyphema. Mild pain with EOMs of the right eye.    Visual Acuity   Right Eye Near: R Near: 20/40 Left Eye Near:  L Near: 20/40 Bilateral Near:  20/25   Neck: Normal range of motion. Neck supple. No JVD present. No tracheal deviation present.  Cardiovascular: Normal rate, regular rhythm, normal heart sounds and intact distal pulses.   2+ radial and DP/PT pulses bl, negative Homan's bl   Pulmonary/Chest: Effort normal and breath sounds normal. She exhibits no tenderness.  Status post right mastectomy  Abdominal: Soft. She exhibits no distension. There is no tenderness.  Musculoskeletal: Normal range of motion. She exhibits no edema or tenderness.  No midline spine TTP, no paraspinal muscle tenderness. Normal ROM of BUE and BLE. 5/5 strength of BUE and BLE major muscle groups and good grip strength  Neurological: She is alert and oriented to person, place, and time. No cranial nerve deficit or sensory deficit.  Fluent speech, no facial droop, sensation intact to soft touch globally, normal gait, and patient able to heel walk and toe walk without difficulty. No pronator drift.   Skin: Skin is warm and dry. Capillary refill takes less than 2 seconds. She is not diaphoretic.  Psychiatric: She has a normal mood and affect. Her behavior is normal.     ED Treatments / Results  Labs (all labs ordered are listed, but only abnormal results are displayed) Labs Reviewed - No data to display  EKG  EKG Interpretation None       Radiology Ct Head Wo Contrast  Result Date: 12/31/2016 CLINICAL DATA:  56 year old female with  headache. EXAM: CT HEAD WITHOUT CONTRAST TECHNIQUE: Contiguous axial images were obtained from the base of the skull through the vertex without intravenous contrast. COMPARISON:  None. FINDINGS: Brain: No evidence of acute  infarction, hemorrhage, hydrocephalus, extra-axial collection or mass lesion/mass effect. Vascular: No hyperdense vessel or unexpected calcification. Skull: Normal. Negative for fracture or focal lesion. Sinuses/Orbits: No acute finding. Other: None. IMPRESSION: No acute intracranial pathology. Electronically Signed   By: Anner Crete M.D.   On: 12/31/2016 22:58    Procedures Procedures (including critical care time)  Medications Ordered in ED Medications  acetaminophen (TYLENOL) tablet 1,000 mg (1,000 mg Oral Given 12/31/16 2231)     Initial Impression / Assessment and Plan / ED Course  I have reviewed the triage vital signs and the nursing notes.  Pertinent labs & imaging results that were available during my care of the patient were reviewed by me and considered in my medical decision making (see chart for details).     Patient with right-sided posterior ocular headache for 5 minutes with subsequent vomit of subconjunctival hemorrhage and later development of frontal headache. No focal neurological deficits, initially hypertensive while in the ED with resolution on reevaluation. Patient had not taken any of her blood pressure medications prior to arrival. CT head shows no acute intracranial pathology or evidence of entrapment. Low suspicion of ICH, SAH, stroke or other concerning intracranial pathology. Significant improvement of headache with Tylenol while in the ED. No further emergent workup required. Recommend follow-up with primary care for reevaluation in one week. Discussed indications for return to the ED immediately. Pt verbalized understanding of and agreement with plan and is safe for discharge home at this time.  Final Clinical Impressions(s) / ED Diagnoses   Final diagnoses:  Bad headache  Subconjunctival hemorrhage of right eye    New Prescriptions New Prescriptions   No medications on file     Debroah Baller 12/31/16 2355    Lacretia Leigh, MD 01/03/17  1334

## 2017-01-12 DIAGNOSIS — Z452 Encounter for adjustment and management of vascular access device: Secondary | ICD-10-CM | POA: Diagnosis not present

## 2017-02-16 ENCOUNTER — Ambulatory Visit (AMBULATORY_SURGERY_CENTER): Payer: Self-pay | Admitting: *Deleted

## 2017-02-16 ENCOUNTER — Telehealth: Payer: Self-pay | Admitting: Gastroenterology

## 2017-02-16 VITALS — Ht 64.0 in | Wt 220.0 lb

## 2017-02-16 DIAGNOSIS — Z1211 Encounter for screening for malignant neoplasm of colon: Secondary | ICD-10-CM

## 2017-02-16 MED ORDER — SUPREP BOWEL PREP KIT 17.5-3.13-1.6 GM/177ML PO SOLN: 1.0000 | Freq: Once | ORAL | 0 refills | Status: AC

## 2017-02-16 MED ORDER — NA SULFATE-K SULFATE-MG SULF 17.5-3.13-1.6 GM/177ML PO SOLN: 1.0000 [IU] | Freq: Once | ORAL | 0 refills | Status: AC

## 2017-02-16 NOTE — Telephone Encounter (Signed)
Sent Suprep order to [facility] on [address] in [place] electronically.  LMOM for pt. Kaitlyn Cervantes/PV

## 2017-02-16 NOTE — Progress Notes (Signed)
No egg or soy allergy known to patient  No issues with past sedation with any surgeries  or procedures, no intubation problems  No diet pills per patient No home 02 use per patient  No blood thinners per patient  Pt denies issues with constipation pt. States she moves her bowels every 2-3 days.   No A fib or A flutter  EMMI video sent to pt's e mail

## 2017-02-23 ENCOUNTER — Encounter: Payer: Self-pay | Admitting: Gastroenterology

## 2017-03-02 ENCOUNTER — Ambulatory Visit (AMBULATORY_SURGERY_CENTER): Payer: 59 | Admitting: Gastroenterology

## 2017-03-02 ENCOUNTER — Encounter: Payer: Self-pay | Admitting: Gastroenterology

## 2017-03-02 VITALS — BP 161/63 | HR 58 | Temp 97.3°F | Resp 16 | Ht 64.0 in | Wt 220.0 lb

## 2017-03-02 DIAGNOSIS — Z1211 Encounter for screening for malignant neoplasm of colon: Secondary | ICD-10-CM

## 2017-03-02 DIAGNOSIS — Z1212 Encounter for screening for malignant neoplasm of rectum: Secondary | ICD-10-CM

## 2017-03-02 DIAGNOSIS — G4733 Obstructive sleep apnea (adult) (pediatric): Secondary | ICD-10-CM | POA: Diagnosis not present

## 2017-03-02 MED ORDER — SODIUM CHLORIDE 0.9 % IV SOLN: 500.0000 mL | INTRAVENOUS | Status: AC

## 2017-03-02 NOTE — Patient Instructions (Signed)
YOU HAD AN ENDOSCOPIC PROCEDURE TODAY AT THE [facility]:   Refer to the procedure report that was given to you for any specific questions about what was found during the examination.  If the procedure report does not answer your questions, please call your gastroenterologist to clarify.  If you requested that your care partner not be given the details of your procedure findings, then the procedure report has been included in a sealed envelope for you to review at your convenience later.  YOU SHOULD EXPECT: Some feelings of bloating in the abdomen. Passage of more gas than usual.  Walking can help get rid of the air that was put into your GI tract during the procedure and reduce the bloating. If you had a lower endoscopy (such as a colonoscopy or flexible sigmoidoscopy) you may notice spotting of blood in your stool or on the toilet paper. If you underwent a bowel prep for your procedure, you may not have a normal bowel movement for a few days.  Please Note:  You might notice some irritation and congestion in your nose or some drainage.  This is from the oxygen used during your procedure.  There is no need for concern and it should clear up in a day or so.  SYMPTOMS TO REPORT IMMEDIATELY:   Following lower endoscopy (colonoscopy or flexible sigmoidoscopy):  Excessive amounts of blood in the stool  Significant tenderness or worsening of abdominal pains  Swelling of the abdomen that is new, acute  Fever of 100F or higher   For urgent or emergent issues, a gastroenterologist can be reached at any hour by calling [phone].   DIET:  We do recommend a small meal at first, but then you may proceed to your regular diet.  Drink plenty of fluids but you should avoid alcoholic beverages for 24 hours.  ACTIVITY:  You should plan to take it easy for the rest of today and you should NOT DRIVE or use heavy machinery until tomorrow (because of the sedation medicines used during the test).     FOLLOW UP: Our staff will call the number listed on your records the next business day following your procedure to check on you and address any questions or concerns that you may have regarding the information given to you following your procedure. If we do not reach you, we will leave a message.  However, if you are feeling well and you are not experiencing any problems, there is no need to return our call.  We will assume that you have returned to your regular daily activities without incident.  If any biopsies were taken you will be contacted by phone or by letter within the next 1-3 weeks.  Please call us at [phone] if you have not heard about the biopsies in 3 weeks.    SIGNATURES/CONFIDENTIALITY: You and/or your care partner have signed paperwork which will be entered into your electronic medical record.  These signatures attest to the fact that that the information above on your After Visit Summary has been reviewed and is understood.  Full responsibility of the confidentiality of this discharge information lies with you and/or your care-partner.  Thank you for letting us take care of your healthcare needs today. 

## 2017-03-02 NOTE — Progress Notes (Signed)
Called to room to assist during endoscopic procedure.  Patient ID and intended procedure confirmed with present staff. Received instructions for my participation in the procedure from the performing physician.  

## 2017-03-02 NOTE — Progress Notes (Signed)
Report to PACU, RN, vss, BBS= Clear.  

## 2017-03-02 NOTE — Op Note (Signed)
[facility] Patient Name: Kaitlyn Cervantes Procedure Date: 03/02/2017 8:00 AM MRN: [id number] Endoscopist: Ladene Artist , MD Age: 56 Referring MD:  Date of Birth: [birth year] Gender: Female Account #: [id number] Procedure:                Colonoscopy Indications:              Screening for colorectal malignant neoplasm Medicines:                Monitored Anesthesia Care Procedure:                Pre-Anesthesia Assessment:                           - Prior to the procedure, a History and Physical                            was performed, and patient medications and                            allergies were reviewed. The patient's tolerance of                            previous anesthesia was also reviewed. The risks                            and benefits of the procedure and the sedation                            options and risks were discussed with the patient.                            All questions were answered, and informed consent                            was obtained. Prior Anticoagulants: The patient has                            taken no previous anticoagulant or antiplatelet                            agents. ASA Grade Assessment: II - A patient with                            mild systemic disease. After reviewing the risks                            and benefits, the patient was deemed in                            satisfactory condition to undergo the procedure.                           After obtaining informed consent, the colonoscope  was passed under direct vision. Throughout the                            procedure, the patient's blood pressure, pulse, and                            oxygen saturations were monitored continuously. The                            Model PCF-[id number]) scope was introduced                            through the anus and advanced to the the cecum,                            identified by  appendiceal orifice and ileocecal                            valve. The ileocecal valve, appendiceal orifice,                            and rectum were photographed. The quality of the                            bowel preparation was excellent. The colonoscopy                            was performed without difficulty. The patient                            tolerated the procedure well. Scope In: 8:04:47 AM Scope Out: 8:16:03 AM Scope Withdrawal Time: 0 hours 8 minutes 56 seconds  Total Procedure Duration: 0 hours 11 minutes 16 seconds  Findings:                 The perianal and digital rectal examinations were                            normal.                           A few small-mouthed diverticula were found in the                            sigmoid colon. There was no evidence of                            diverticular bleeding.                           Internal hemorrhoids were found during                            retroflexion. The hemorrhoids were small and Grade  I (internal hemorrhoids that do not prolapse).                           The exam was otherwise without abnormality on                            direct and retroflexion views. Complications:            No immediate complications. Estimated blood loss:                            None. Estimated Blood Loss:     Estimated blood loss: none. Impression:               - Mild diverticulosis in the sigmoid colon. There                            was no evidence of diverticular bleeding.                           - Internal hemorrhoids.                           - The examination was otherwise normal on direct                            and retroflexion views.                           - No specimens collected. Recommendation:           - Repeat colonoscopy in 10 years for screening                            purposes.                           - Patient has a contact number available for                             emergencies. The signs and symptoms of potential                            delayed complications were discussed with the                            patient. Return to normal activities tomorrow.                            Written discharge instructions were provided to the                            patient.                           - Resume previous diet.                           -  Continue present medications. Ladene Artist, MD 03/02/2017 8:22:38 AM This report has been signed electronically.

## 2017-03-03 ENCOUNTER — Telehealth: Payer: Self-pay | Admitting: *Deleted

## 2017-03-03 NOTE — Telephone Encounter (Signed)
  Follow up Call-  Call back number 03/02/2017  Post procedure Call Back phone  # [phone]  Permission to leave phone message Yes  Some recent data might be hidden     No answer, left message

## 2017-03-03 NOTE — Telephone Encounter (Signed)
  Follow up Call-  Call back number 03/02/2017  Post procedure Call Back phone  # [phone]  Permission to leave phone message Yes  Some recent data might be hidden     No answer, left message.

## 2017-03-17 ENCOUNTER — Other Ambulatory Visit: Payer: Self-pay | Admitting: Family Medicine

## 2017-04-12 DIAGNOSIS — Z9221 Personal history of antineoplastic chemotherapy: Secondary | ICD-10-CM | POA: Diagnosis not present

## 2017-04-12 DIAGNOSIS — Z170 Estrogen receptor positive status [ER+]: Secondary | ICD-10-CM | POA: Diagnosis not present

## 2017-04-12 DIAGNOSIS — Z853 Personal history of malignant neoplasm of breast: Secondary | ICD-10-CM | POA: Diagnosis not present

## 2017-04-12 DIAGNOSIS — E119 Type 2 diabetes mellitus without complications: Secondary | ICD-10-CM | POA: Diagnosis not present

## 2017-04-12 DIAGNOSIS — I10 Essential (primary) hypertension: Secondary | ICD-10-CM | POA: Diagnosis not present

## 2017-04-12 DIAGNOSIS — C50211 Malignant neoplasm of upper-inner quadrant of right female breast: Secondary | ICD-10-CM | POA: Diagnosis not present

## 2017-04-12 DIAGNOSIS — Z08 Encounter for follow-up examination after completed treatment for malignant neoplasm: Secondary | ICD-10-CM | POA: Diagnosis not present

## 2017-04-26 DIAGNOSIS — C50211 Malignant neoplasm of upper-inner quadrant of right female breast: Secondary | ICD-10-CM | POA: Diagnosis not present

## 2017-04-26 DIAGNOSIS — Z853 Personal history of malignant neoplasm of breast: Secondary | ICD-10-CM | POA: Diagnosis not present

## 2017-04-26 DIAGNOSIS — Z170 Estrogen receptor positive status [ER+]: Secondary | ICD-10-CM | POA: Diagnosis not present

## 2017-04-26 DIAGNOSIS — Z9011 Acquired absence of right breast and nipple: Secondary | ICD-10-CM | POA: Diagnosis not present

## 2017-04-26 DIAGNOSIS — L905 Scar conditions and fibrosis of skin: Secondary | ICD-10-CM | POA: Diagnosis not present

## 2017-05-22 DIAGNOSIS — Z452 Encounter for adjustment and management of vascular access device: Secondary | ICD-10-CM | POA: Diagnosis not present

## 2017-05-28 NOTE — Progress Notes (Signed)
Subjective:  Kaitlyn Cervantes is a 56 y.o. year old female with past medical history of hypertension, congestive heart failure, type 2 diabetes, hyperlipidemia, OSA, anemia, history of breast cancer, who presents to office today for an annual physical examination.  Concerns today include:  1. Ear pain: Patient states that for the last few days she has had pain in both of her ears.  Admits that she is also had some sinus congestion.  Denies any history of sinusitis or allergic rhinitis.  Denies any sick contacts.  No fevers chills.  Is not tried any medication for this.  No ear discharge.  2. Increasing urinary frequency: Patient endorses increased urination frequency for the last couple days.  She has not had any dysuria.  Admits to dark urine.  Denies any gross blood in her urine.  Admits to UTIs in the past.  No back pain.  3. OSA: Patient states that she has been diagnosed with sleep apnea many years ago.  More than 10 years ago she was prescribed a CPAP machine however she no longer has a CPAP machine.  She admits to waking up with a balloon night, snoring, daytime sleepiness, occasional headaches.  Notes that she never feels well rested and even feels sleepy during daily activities.  She would like to be referred for another sleep test so she can get the proper CPAP supplies.  4.  Breast cancer: Patient notes that she has had breast cancer in her right breast.  Status post mastectomy of right breast.  States that this was done in January 2018.  Patient notes that she finished chemotherapy December 2017.  She is still following with her oncologist at [place].  Review of Systems  Constitutional: Negative for fever and weight loss.  HENT: Negative for hearing loss  Eyes: Negative for blurred vision.  Respiratory: Negative for cough, shortness of breath and wheezing.   Cardiovascular: Negative for chest pain and leg swelling.  Gastrointestinal: Negative for abdominal pain, blood in stool,  constipation, diarrhea, heartburn, melena, nausea and vomiting.  Genitourinary: Negative for dysuria  Musculoskeletal: Negative for back pain and joint pain.  Skin: Negative for rash.  Neurological: Negative for dizziness, tingling, focal weakness  Psychiatric/Behavioral: Negative for depression and suicidal ideas.   General Healthcare: Medication Compliance: Compliant with all of her medications Dx Hypertension: Yes Dx Hyperlipidemia: Yes Diabetes: Yes Dx Obesity: Yes Weight Loss: No  Physical Activity: No exercise Urinary Incontinence: None  Menstrual hx: post menopausal  Last dental exam: not recently   Social:  reports that  has never smoked. she has never used smokeless tobacco. Driving: Drives herself, wears seatbelt  Alcohol Use:  1-2 drinks per month Tobacco none  Other Drugs:  No  Support and Life at Home:  Patient states she has a good support system with friends Advanced Directives: None  Cancer:  Colorectal >> Colonoscopy: Up-to-date Breast >> Mammogram: Status post breast cancer and right breast with mastectomy and chemotherapy.  Following with oncologist Cervical/Endometrial >>  - Postmenopausal: Yes - Hysterectomy: No - Vaginal Bleeding: No Skin >> Suspicious lesions: No  Other: Osteoporosis: No  Health Maintenance Due  Topic Date Due  . PNEUMOCOCCAL POLYSACCHARIDE VACCINE (1) 03/24/1962  . OPHTHALMOLOGY EXAM  03/24/1970  . FOOT EXAM  08/30/2016  Declines flu shot  Past Medical History Past Medical History:  Diagnosis Date  . Anemia 2009  . Blood transfusion without reported diagnosis 2009  . Breast cancer of upper-inner quadrant of right female breast ([place])  10/19/2015   BRCA I & II negative  . Diabetes mellitus without complication ([place])   . Gallstones    has not had removed  . History of stomach ulcers    treated   . Hypertension   . Kidney stones   . Sleep apnea 2005   Patient Active Problem List   Diagnosis Date Noted  . Frequent  urination 05/29/2017  . Otitis media with effusion, bilateral 05/29/2017  . Congestive heart failure ([place]) 08/01/2016  . UTI (urinary tract infection) 04/15/2016  . Breast cancer of upper-inner quadrant of right female breast ([place]) 10/19/2015  . Hematuria 08/31/2015  . Constipation 09/19/2014  . Kidney stone 09/19/2014  . Hydronephrosis 08/27/2014  . Hyperlipidemia 10/30/2013  . Type 2 diabetes mellitus, uncontrolled ([place]) 10/29/2013  . Essential hypertension, benign 10/29/2013  . History of obstructive sleep apnea 10/29/2013  . Anemia 10/29/2013    Medications- reviewed and updated Current Outpatient Medications  Medication Sig Dispense Refill  . aspirin EC 81 MG tablet Take 81 mg by mouth daily.    . bumetanide (BUMEX) 1 MG tablet Take 1 mg by mouth daily.     . carvedilol (COREG) 6.25 MG tablet Take 6.25 mg by mouth 2 (two) times daily with a meal.     . Cholecalciferol (VITAMIN D3) 5000 units TABS Take 5,000 Units by mouth daily.    . Garlic 6283 MG CAPS Take 1 capsule by mouth daily.    Marland Kitchen lidocaine-prilocaine (EMLA) cream Apply thin layer to skin as needed before treatment    . lisinopril (PRINIVIL,ZESTRIL) 40 MG tablet TAKE ONE TABLET BY MOUTH ONCE DAILY 90 tablet 1  . metFORMIN (GLUCOPHAGE) 1000 MG tablet Take 1 tablet (1,000 mg total) by mouth 2 (two) times daily with a meal. 180 tablet 3  . Multiple Vitamin (MULTIVITAMIN WITH MINERALS) TABS tablet Take 1 tablet by mouth daily.    . NORVASC 10 MG tablet TAKE ONE TABLET BY MOUTH ONCE DAILY 30 tablet 11  . spironolactone (ALDACTONE) 25 MG tablet Take 25 mg by mouth daily.     . Blood Glucose Monitoring Suppl (ONETOUCH VERIO) w/Device KIT 1 kit every morning by Does not apply route. 1 kit 1  . Coenzyme Q10 (COQ10) 100 MG CAPS Take 1 capsule by mouth daily.    . fluticasone (FLONASE) 50 MCG/ACT nasal spray Place 2 sprays daily into both nostrils. 16 g 6  . glucose blood (ONETOUCH VERIO) test strip Check blood sugar 6 x daily 200  each 3  . Lancet Devices (ONE TOUCH DELICA LANCING DEV) MISC 1 application by Does not apply route every morning. 100 each 1  . ONETOUCH DELICA LANCETS 15V MISC 1 application every morning by Does not apply route. 100 each 1   Current Facility-Administered Medications  Medication Dose Route Frequency Provider Last Rate Last Dose  . 0.9 %  sodium chloride infusion  500 mL Intravenous Continuous Ladene Artist, MD        Objective: BP (!) 144/80   Pulse 67   Temp 98.5 F (36.9 C) (Oral)   Ht '5\' 4"'$  (1.626 m)   Wt 223 lb (101.2 kg)   SpO2 99%   BMI 38.28 kg/m  Gen: In no acute distress, alert, cooperative with exam, well groomed HEENT: NCAT, EOMI, PERRL, fluid seen behind TMs bilaterally however no erythema or pus, TMs CV: Regular rate and rhythm, normal S1/S2, no murmur Resp: Clear to auscultation bilaterally, no wheezes, non-labored Abd: Soft, Non Tender, Non Distended, bowel  sounds present, no guarding or organomegaly Ext: No edema, warm and well perfused Neuro: Alert and oriented, No gross deficits, normal gait Psych: Normal mood and affect   Assessment/Plan:  Essential hypertension, benign Uncontrolled today with BP of 144/80.  Was the same when rechecked later in the visit.  I asked patient to check her BP at home and if it is still above goal of 140/90 then we will need to adjust her antihypertensive medications. -Continue home carvedilol 6.25 mg twice daily, lisinopril 40 mg daily, amlodipine 10 mg daily, spironolactone 25 mg daily. Is not on HCTZ due to history of renal stones. - Suspect that blood pressure may be out of control still due to no treatment for her sleep apnea -Patient will call the clinic if her BP at home is still above goal -CMP today  Type 2 diabetes mellitus, uncontrolled ([place]) A1c last checked 1 year ago and it was 7.3.  Patient would like to have her A1c checked tomorrow when she gets the rest of her labs. -Referral to ophthalmology placed - Will  follow up with A1c results and call patient to discuss plan afterwards - Continue metformin 1000 mg twice daily  History of obstructive sleep apnea Has been diagnosed with sleep apnea more than 10 years ago per patient report.  Not using a CPAP machine as she does not have one.  She is symptomatic. -Referral to sleep medicine placed  Hyperlipidemia History of hyperlipidemia. - Check fasting lipid panel in future  Anemia Patient has history of anemia.  Does not appear to have any symptoms of anemia. -CBC  Otitis media with effusion, bilateral Patient having ear pain. No signs of infection - Flonase - Reassured patient this should resolve - Return precautions discussed  Breast cancer of upper-inner quadrant of right female breast [facility]) Status post right breast mastectomy in January 2018  and chemotherapy in 2017. -Continue to follow with [facility] oncology, we will follow along  Hematuria Patient has a history of hematuria and previously followed with urology per epic review.  She endorses increased urinary frequency but UA does not show any signs of UTI however she does have persistent hematuria.  UA results came back after patient left the clinic, tried calling patient 2 times to discuss these results with no answer. -Patient will need to follow-up with urology when we are able to get in touch with her -Urine culture ordered   Orders Placed This Encounter  Procedures  . Urine Culture  . CBC with Differential  . Comprehensive metabolic panel    Order Specific Question:   Has the patient fasted?    Answer:   No  . VITAMIN D 25 Hydroxy (Vit-D Deficiency, Fractures)  . Lipid panel    Standing Status:   Future    Number of Occurrences:   1    Standing Expiration Date:   05/29/2018    Order Specific Question:   Has the patient fasted?    Answer:   No  . Ambulatory referral to Ophthalmology    Referral Priority:   Routine    Referral Type:   Consultation    Referral Reason:    Specialty Services Required    Requested Specialty:   Ophthalmology    Number of Visits Requested:   1  . Ambulatory referral to Sleep Studies    Referral Priority:   Routine    Referral Type:   Consultation    Referral Reason:   Specialty Services Required  Number of Visits Requested:   1  . HgB A1c  . Urinalysis Dipstick  . POCT urinalysis dipstick  . POCT UA - Microscopic Only    Follow-up in 3 months for diabetes  Smitty Cords, MD [place], PGY-3

## 2017-05-29 ENCOUNTER — Other Ambulatory Visit: Payer: Self-pay

## 2017-05-29 ENCOUNTER — Ambulatory Visit (INDEPENDENT_AMBULATORY_CARE_PROVIDER_SITE_OTHER): Payer: 59 | Admitting: Family Medicine

## 2017-05-29 ENCOUNTER — Encounter: Payer: Self-pay | Admitting: Family Medicine

## 2017-05-29 VITALS — BP 144/80 | HR 67 | Temp 98.5°F | Ht 64.0 in | Wt 223.0 lb

## 2017-05-29 DIAGNOSIS — R350 Frequency of micturition: Secondary | ICD-10-CM | POA: Insufficient documentation

## 2017-05-29 DIAGNOSIS — Z8669 Personal history of other diseases of the nervous system and sense organs: Secondary | ICD-10-CM | POA: Diagnosis not present

## 2017-05-29 DIAGNOSIS — D649 Anemia, unspecified: Secondary | ICD-10-CM

## 2017-05-29 DIAGNOSIS — E785 Hyperlipidemia, unspecified: Secondary | ICD-10-CM | POA: Diagnosis not present

## 2017-05-29 DIAGNOSIS — R319 Hematuria, unspecified: Secondary | ICD-10-CM | POA: Diagnosis not present

## 2017-05-29 DIAGNOSIS — H6593 Unspecified nonsuppurative otitis media, bilateral: Secondary | ICD-10-CM | POA: Insufficient documentation

## 2017-05-29 DIAGNOSIS — E1165 Type 2 diabetes mellitus with hyperglycemia: Secondary | ICD-10-CM

## 2017-05-29 DIAGNOSIS — C50211 Malignant neoplasm of upper-inner quadrant of right female breast: Secondary | ICD-10-CM

## 2017-05-29 DIAGNOSIS — I10 Essential (primary) hypertension: Secondary | ICD-10-CM

## 2017-05-29 DIAGNOSIS — Z171 Estrogen receptor negative status [ER-]: Secondary | ICD-10-CM

## 2017-05-29 DIAGNOSIS — Z0000 Encounter for general adult medical examination without abnormal findings: Secondary | ICD-10-CM

## 2017-05-29 LAB — POCT URINALYSIS DIP (MANUAL ENTRY)
BILIRUBIN UA: NEGATIVE
BILIRUBIN UA: NEGATIVE mg/dL
Glucose, UA: NEGATIVE mg/dL
NITRITE UA: NEGATIVE
PH UA: 6.5 (ref 5.0–8.0)
Spec Grav, UA: 1.025 (ref 1.010–1.025)
Urobilinogen, UA: 1 E.U./dL

## 2017-05-29 LAB — POCT UA - MICROSCOPIC ONLY

## 2017-05-29 MED ORDER — ONETOUCH VERIO W/DEVICE KIT: 1.0000 | PACK | 1 refills | Status: DC

## 2017-05-29 MED ORDER — FLUTICASONE PROPIONATE 50 MCG/ACT NA SUSP: 2.0000 | Freq: Every day | NASAL | 6 refills | Status: DC

## 2017-05-29 MED ORDER — ONETOUCH DELICA LANCING DEV MISC: 1.0000 "application " | 1 refills | Status: DC

## 2017-05-29 MED ORDER — ONETOUCH DELICA LANCETS 33G MISC: 1.0000 "application " | 1 refills | Status: DC

## 2017-05-29 MED ORDER — GLUCOSE BLOOD VI STRP: ORAL_STRIP | 3 refills | Status: DC

## 2017-05-29 NOTE — Assessment & Plan Note (Signed)
History of hyperlipidemia. - Check fasting lipid panel in future

## 2017-05-29 NOTE — Assessment & Plan Note (Signed)
Has been diagnosed with sleep apnea more than 10 years ago per patient report.  Not using a CPAP machine as she does not have one.  She is symptomatic. -Referral to sleep medicine placed

## 2017-05-29 NOTE — Patient Instructions (Signed)
Thank you for coming in today, it was so nice to see you! Today we talked about:    Blood pressure; your blood pressure was high today.  Please check your blood pressure at home.  Your goal blood pressure is for the top number to be under 140 and the bottom number to be under 90.  If your blood pressure is above this please call me and let me know as we may need to make medication adjustments  For congestion and ear pain I suggest using Flonase.  A prescription has been sent to your pharmacy  I have placed a referral for the ophthalmologist, someone will call you within the next week to schedule this  I placed a referral for sleep medicine for sleep apnea, someone will call you with the next week to schedule this  We are ordering a urine test, A1c, CBC, CMP, vitamin D today.  I have ordered a lipid panel to check your cholesterol in the future  Please follow up in 3 months for diabetes. You can schedule this appointment at the front desk before you leave or call the clinic.  Bring in all your medications or supplements to each appointment for review.   If we ordered any tests today, you will be notified via telephone of any abnormalities. If everything is normal you will get a letter in the mail.   If you have any questions or concerns, please do not hesitate to call the office at [phone]. You can also message me directly via MyChart.   Sincerely,  Smitty Cords, MD

## 2017-05-29 NOTE — Assessment & Plan Note (Signed)
A1c last checked 1 year ago and it was 7.3.  Patient would like to have her A1c checked tomorrow when she gets the rest of her labs. -Referral to ophthalmology placed - Will follow up with A1c results and call patient to discuss plan afterwards - Continue metformin 1000 mg twice daily

## 2017-05-29 NOTE — Assessment & Plan Note (Signed)
Patient has history of anemia.  Does not appear to have any symptoms of anemia. -CBC

## 2017-05-29 NOTE — Assessment & Plan Note (Signed)
Status post right breast mastectomy in January 2018  and chemotherapy in 2017. -Continue to follow with [facility] oncology, we will follow along

## 2017-05-29 NOTE — Assessment & Plan Note (Addendum)
Uncontrolled today with BP of 144/80.  Was the same when rechecked later in the visit.  I asked patient to check her BP at home and if it is still above goal of 140/90 then we will need to adjust her antihypertensive medications. -Continue home carvedilol 6.25 mg twice daily, lisinopril 40 mg daily, amlodipine 10 mg daily, spironolactone 25 mg daily. Is not on HCTZ due to history of renal stones. - Suspect that blood pressure may be out of control still due to no treatment for her sleep apnea -Patient will call the clinic if her BP at home is still above goal -CMP today

## 2017-05-29 NOTE — Assessment & Plan Note (Signed)
Patient having ear pain. No signs of infection - Flonase - Reassured patient this should resolve - Return precautions discussed

## 2017-05-30 ENCOUNTER — Other Ambulatory Visit: Payer: 59

## 2017-05-30 DIAGNOSIS — E785 Hyperlipidemia, unspecified: Secondary | ICD-10-CM

## 2017-05-30 DIAGNOSIS — E1165 Type 2 diabetes mellitus with hyperglycemia: Secondary | ICD-10-CM | POA: Diagnosis not present

## 2017-05-30 DIAGNOSIS — D649 Anemia, unspecified: Secondary | ICD-10-CM | POA: Diagnosis not present

## 2017-05-30 DIAGNOSIS — I10 Essential (primary) hypertension: Secondary | ICD-10-CM | POA: Diagnosis not present

## 2017-05-30 LAB — POCT GLYCOSYLATED HEMOGLOBIN (HGB A1C): Hemoglobin A1C: 6.5

## 2017-05-31 ENCOUNTER — Telehealth: Payer: Self-pay | Admitting: *Deleted

## 2017-05-31 LAB — CBC WITH DIFFERENTIAL/PLATELET
BASOS: 0 %
Basophils Absolute: 0 10*3/uL (ref 0.0–0.2)
EOS (ABSOLUTE): 0.1 10*3/uL (ref 0.0–0.4)
EOS: 3 %
HEMATOCRIT: 37.7 % (ref 34.0–46.6)
HEMOGLOBIN: 12.1 g/dL (ref 11.1–15.9)
IMMATURE GRANS (ABS): 0 10*3/uL (ref 0.0–0.1)
IMMATURE GRANULOCYTES: 0 %
LYMPHS: 34 %
Lymphocytes Absolute: 1.2 10*3/uL (ref 0.7–3.1)
MCH: 26 pg — ABNORMAL LOW (ref 26.6–33.0)
MCHC: 32.1 g/dL (ref 31.5–35.7)
MCV: 81 fL (ref 79–97)
MONOCYTES: 10 %
Monocytes Absolute: 0.3 10*3/uL (ref 0.1–0.9)
NEUTROS ABS: 1.8 10*3/uL (ref 1.4–7.0)
NEUTROS PCT: 53 %
PLATELETS: 254 10*3/uL (ref 150–379)
RBC: 4.66 x10E6/uL (ref 3.77–5.28)
RDW: 15 % (ref 12.3–15.4)
WBC: 3.4 10*3/uL (ref 3.4–10.8)

## 2017-05-31 LAB — COMPREHENSIVE METABOLIC PANEL
A/G RATIO: 1.8 (ref 1.2–2.2)
ALBUMIN: 4.3 g/dL (ref 3.5–5.5)
ALT: 14 IU/L (ref 0–32)
AST: 17 IU/L (ref 0–40)
Alkaline Phosphatase: 124 IU/L — ABNORMAL HIGH (ref 39–117)
BUN / CREAT RATIO: 14 (ref 9–23)
BUN: 13 mg/dL (ref 6–24)
Bilirubin Total: 0.4 mg/dL (ref 0.0–1.2)
CALCIUM: 9.7 mg/dL (ref 8.7–10.2)
CO2: 24 mmol/L (ref 20–29)
Chloride: 103 mmol/L (ref 96–106)
Creatinine, Ser: 0.92 mg/dL (ref 0.57–1.00)
GFR, EST AFRICAN AMERICAN: 80 mL/min/{1.73_m2} (ref >59)
GFR, EST NON AFRICAN AMERICAN: 69 mL/min/{1.73_m2} (ref >59)
GLOBULIN, TOTAL: 2.4 g/dL (ref 1.5–4.5)
Glucose: 122 mg/dL — ABNORMAL HIGH (ref 65–99)
POTASSIUM: 4.4 mmol/L (ref 3.5–5.2)
Sodium: 141 mmol/L (ref 134–144)
TOTAL PROTEIN: 6.7 g/dL (ref 6.0–8.5)

## 2017-05-31 LAB — LIPID PANEL
CHOL/HDL RATIO: 3.7 ratio (ref 0.0–4.4)
Cholesterol, Total: 194 mg/dL (ref 100–199)
HDL: 52 mg/dL (ref >39)
LDL CALC: 128 mg/dL — AB (ref 0–99)
Triglycerides: 68 mg/dL (ref 0–149)
VLDL CHOLESTEROL CAL: 14 mg/dL (ref 5–40)

## 2017-05-31 LAB — VITAMIN D 25 HYDROXY (VIT D DEFICIENCY, FRACTURES): Vit D, 25-Hydroxy: 45.7 ng/mL (ref 30.0–100.0)

## 2017-05-31 MED ORDER — ONETOUCH DELICA LANCING DEV MISC: 1.0000 "application " | 1 refills | Status: DC

## 2017-05-31 NOTE — Telephone Encounter (Signed)
Kaitlyn Cervantes with [facility] called asking for clarification on order for lancets. Please call him at [phone]. Hubbard Hartshorn, RN, BSN

## 2017-06-02 DIAGNOSIS — Z0000 Encounter for general adult medical examination without abnormal findings: Secondary | ICD-10-CM | POA: Insufficient documentation

## 2017-06-02 NOTE — Assessment & Plan Note (Signed)
Patient doing well overall.  See specific assessment and plans for other issues. - Follows with oncology for history of breast cancer, no mammogram indicated today - Declines flu shot -Up-to-date on colonoscopy -Referral for ophthalmology placed for diabetic eye exam -Labs ordered as below

## 2017-06-02 NOTE — Assessment & Plan Note (Signed)
Patient has a history of hematuria and previously followed with urology per epic review.  She endorses increased urinary frequency but UA does not show any signs of UTI however she does have persistent hematuria.  UA results came back after patient left the clinic, tried calling patient 2 times to discuss these results with no answer. -Patient will need to follow-up with urology when we are able to get in touch with her -Urine culture ordered

## 2017-06-09 ENCOUNTER — Telehealth: Payer: Self-pay | Admitting: *Deleted

## 2017-06-09 NOTE — Telephone Encounter (Signed)
Patient left message on nurse line stating she was returning call for lab results. Hubbard Hartshorn, RN, BSN

## 2017-06-12 ENCOUNTER — Telehealth: Payer: Self-pay | Admitting: Family Medicine

## 2017-06-12 DIAGNOSIS — N029 Recurrent and persistent hematuria with unspecified morphologic changes: Secondary | ICD-10-CM

## 2017-06-12 NOTE — Telephone Encounter (Signed)
Also, patient needs prescription for a 'meter' sent to [facility] on [address].

## 2017-06-12 NOTE — Telephone Encounter (Signed)
Called patient to discuss her lab results. No answer. Left voicemail. Will be happy to discuss further with her.   Smitty Cords, MD [place], PGY-3

## 2017-06-12 NOTE — Telephone Encounter (Signed)
Pt called and said she asked at her appt on 11/19 that her any of her lab results or anything taken on 11/19 and 11/20 be sent to her home address. Her mychart account is not working at this time and would like this mailed to her home. If you will also call and discuss these results with her as well. Please advise

## 2017-06-12 NOTE — Telephone Encounter (Signed)
Patient has not received any results from 05/29/17 physical exam.  Daughter states she does not know if it was labwork or what it was but pt has not gotten a call yet about it.

## 2017-06-13 MED ORDER — ONETOUCH VERIO W/DEVICE KIT: 1.0000 | PACK | 1 refills | Status: AC

## 2017-06-13 NOTE — Addendum Note (Signed)
Addended by: Carlyle Dolly on: 06/13/2017 06:44 PM   Modules accepted: Orders

## 2017-06-13 NOTE — Telephone Encounter (Addendum)
Labs are normal except for elevated cholesterol and blood in urine.   I would like for her to see urology. Referral placed I would like to start her on a statin, will need to discuss this with her.   Tried calling patient again to discuss this. No answer. Will send a letter to her house.  New meter rx given.   Smitty Cords, MD [place], PGY-3

## 2017-06-14 ENCOUNTER — Other Ambulatory Visit: Payer: Self-pay | Admitting: Family Medicine

## 2017-06-14 MED ORDER — ONETOUCH DELICA LANCETS FINE MISC: 1.0000 "application " | Freq: Two times a day (BID) | 3 refills | Status: AC

## 2017-06-29 ENCOUNTER — Telehealth: Payer: Self-pay | Admitting: Family Medicine

## 2017-06-29 NOTE — Telephone Encounter (Signed)
Patient has been expecting to receive blood work results in the mail but has not received yet.  She is waiting here to pick up today.  Please let front know if this is ok, thanks

## 2017-06-29 NOTE — Telephone Encounter (Signed)
Pt given letter that was sent to her with all the results from her labs. Katharina Caper, Vernona Peake D, [place]

## 2017-07-05 ENCOUNTER — Other Ambulatory Visit: Payer: Self-pay | Admitting: *Deleted

## 2017-07-05 NOTE — Telephone Encounter (Signed)
Patient left message on nurse line requesting refill on lisinopril and new rx for test strips with testing 3 x daily vs 6 x daily as insurance will only pay for TID testing. Uses [facility] on [address]. Hubbard Hartshorn, RN, BSN

## 2017-07-07 MED ORDER — LISINOPRIL 40 MG PO TABS
40.0000 mg | ORAL_TABLET | Freq: Every day | ORAL | 1 refills | Status: DC
Start: 2017-07-07 — End: 2017-09-13

## 2017-07-14 ENCOUNTER — Other Ambulatory Visit: Payer: Self-pay | Admitting: Student

## 2017-07-14 DIAGNOSIS — I10 Essential (primary) hypertension: Secondary | ICD-10-CM

## 2017-07-14 MED ORDER — NORVASC 10 MG PO TABS: 10.0000 mg | ORAL_TABLET | Freq: Every day | ORAL | 3 refills | Status: DC

## 2017-07-19 DIAGNOSIS — T82898D Other specified complication of vascular prosthetic devices, implants and grafts, subsequent encounter: Secondary | ICD-10-CM | POA: Diagnosis not present

## 2017-07-25 ENCOUNTER — Other Ambulatory Visit: Payer: Self-pay

## 2017-07-25 ENCOUNTER — Encounter: Payer: Self-pay | Admitting: Family Medicine

## 2017-07-25 ENCOUNTER — Ambulatory Visit (INDEPENDENT_AMBULATORY_CARE_PROVIDER_SITE_OTHER): Payer: 59 | Admitting: Family Medicine

## 2017-07-25 VITALS — BP 142/86 | HR 70 | Temp 98.4°F | Ht 64.0 in | Wt 228.2 lb

## 2017-07-25 DIAGNOSIS — S46812A Strain of other muscles, fascia and tendons at shoulder and upper arm level, left arm, initial encounter: Secondary | ICD-10-CM

## 2017-07-25 DIAGNOSIS — I10 Essential (primary) hypertension: Secondary | ICD-10-CM

## 2017-07-25 DIAGNOSIS — R319 Hematuria, unspecified: Secondary | ICD-10-CM

## 2017-07-25 DIAGNOSIS — Z87898 Personal history of other specified conditions: Secondary | ICD-10-CM

## 2017-07-25 DIAGNOSIS — E118 Type 2 diabetes mellitus with unspecified complications: Secondary | ICD-10-CM | POA: Diagnosis not present

## 2017-07-25 DIAGNOSIS — E785 Hyperlipidemia, unspecified: Secondary | ICD-10-CM

## 2017-07-25 MED ORDER — ROSUVASTATIN CALCIUM 10 MG PO TABS: 10.0000 mg | ORAL_TABLET | Freq: Every day | ORAL | 3 refills | Status: DC

## 2017-07-25 MED ORDER — GLUCOSE BLOOD VI STRP: ORAL_STRIP | 3 refills | Status: DC

## 2017-07-25 NOTE — Assessment & Plan Note (Signed)
Discussed patient's last lipid panel with mildly elevated LDL at 147.  Discussed ASCVD risk score of 18% and what that means for her.  She had several questions regarding why her cholesterol is high and what statin therapy could do for her which we discussed. -Begin Crestor 10 mg daily -Will repeat lipid panel in 6 months and can increase Crestor if needed

## 2017-07-25 NOTE — Patient Instructions (Signed)
Thank you for coming in today, it was so nice to see you! Today we talked about:    Diabetes: Continue the same medication that you have been taking.  We will check your A1c again in 3 months.  I sent in a new prescription for test strips. You passed your diabetic foot exam.  High cholesterol; we are starting you on Crestor daily.  We can check your cholesterol again in 6 months  Neck and shoulder pain/swelling: This is likely related to trapezius muscle tension from sitting at a desk. Remember to do gentle stretching and heating pads at the end of the day.  We are going to check your thyroid hormone today  Blood pressure: Her blood pressure is still not at goal.  The goal will be for the top number to be under 140 and the bottom number to be under 90.  Your top number is 142 today.  Check your pressure at home or at the pharmacy and if it remains over 140 then please let me know as we will need to adjust her medication  Please follow up in 3 months for diabetes.   Bring in all your medications or supplements to each appointment for review.   If we ordered any tests today, you will be notified via telephone of any abnormalities. If everything is normal you will get a letter in the mail.   If you have any questions or concerns, please do not hesitate to call the office at [phone]. You can also message me directly via MyChart.   Sincerely,  Smitty Cords, MD

## 2017-07-25 NOTE — Progress Notes (Signed)
Subjective:    Patient ID: Kaitlyn Cervantes , female   DOB: 06/28/1961 , 57 y.o..   MRN: [id number]  HPI  Kaitlyn Cervantes is here for  Chief Complaint  Patient presents with  . Diabetes    1. Chronic Diabetes Disease Monitoring  Blood Sugar Ranges: 120-130, fasting  Polyuria: no   Visual problems: no   Last hemoglobin A1C:  Lab Results  Component Value Date   HGBA1C 6.5 05/30/2017    Medication Compliance: yes, metformin 1000 mg twice daily Medication Side Effects  Hypoglycemia: no   Preventitive Health Care  Eye Exam: overdue  Foot Exam: overdue  Diet pattern: erratic   Exercise: minimal  2. HYPERLIPIDEMIA: Last LDL was elevated at 147, otherwise lipid panel normal.  Patient was wondering why her cholesterol was high and what she can do to change it.  She is hesitant to start any other medications as she states she is already on a lot of blood pressure medicines. Symptoms Chest pain on exertion: no  Leg claudication:   no Medications (modifying factor): none  3. Swelling in neck and shoulder: Patient notes that since November she has had swelling on the back of her neck and her left shoulder.  She notes that the swelling seems to be constant and worsening.  She notes that the most discomfort she feels is when she moves her neck in a certain direction.  Has not taken any medicine for this.  Notes that she was concerned that this was related to her some teeth being pulled in September and December.  She was on a course of amoxicillin due to an abscess that she had in her mouth.  She denies any fevers.  Patient still has a port in place on her chest and she is concerned that there is an infection there and that is what is causing the swelling in her neck and shoulder.  She notes that her husband passed away from bacteria in his blood and she is concerned she has the same thing.  4. Hypertension Blood pressure at home: Does not check Exercise: Minimal Low salt diet:  Noncompliant Medications: Compliant with all her medications Side effects: None ROS: Denies headache, dizziness, visual changes, nausea, vomiting, chest pain, abdominal pain or shortness of breath. BP Readings from Last 3 Encounters:  07/25/17 (!) 142/86  05/29/17 (!) 144/80  03/02/17 (!) 161/63     Review of Systems: Per HPI.   Health Maintenance Due  Topic Date Due  . PNEUMOCOCCAL POLYSACCHARIDE VACCINE (1) 03/24/1962  . OPHTHALMOLOGY EXAM  03/24/1970    Past Medical History: Patient Active Problem List   Diagnosis Date Noted  . Frequent urination 05/29/2017  . Otitis media with effusion, bilateral 05/29/2017  . Congestive heart failure ([place]) 08/01/2016  . UTI (urinary tract infection) 04/15/2016  . Breast cancer of upper-inner quadrant of right female breast ([place]) 10/19/2015  . Hematuria 08/31/2015  . Constipation 09/19/2014  . Kidney stone 09/19/2014  . Hydronephrosis 08/27/2014  . Hyperlipidemia 10/30/2013  . Diabetes ([place]) 10/29/2013  . Essential hypertension, benign 10/29/2013  . History of obstructive sleep apnea 10/29/2013  . Anemia 10/29/2013    Medications: reviewed and updated Current Outpatient Medications  Medication Sig Dispense Refill  . aspirin EC 81 MG tablet Take 81 mg by mouth daily.    . Blood Glucose Monitoring Suppl (ONETOUCH VERIO) w/Device KIT 1 kit by Does not apply route every morning. 1 kit 1  . bumetanide (BUMEX) 1 MG tablet  Take 1 mg by mouth daily.     . carvedilol (COREG) 6.25 MG tablet Take 6.25 mg by mouth 2 (two) times daily with a meal.     . Cholecalciferol (VITAMIN D3) 5000 units TABS Take 5,000 Units by mouth daily.    . Coenzyme Q10 (COQ10) 100 MG CAPS Take 1 capsule by mouth daily.    . fluticasone (FLONASE) 50 MCG/ACT nasal spray Place 2 sprays daily into both nostrils. 16 g 6  . Garlic 5852 MG CAPS Take 1 capsule by mouth daily.    Marland Kitchen glucose blood (ONETOUCH VERIO) test strip Check blood sugar 3 x daily 200 each 3  . Lancet  Devices (ONE TOUCH DELICA LANCING DEV) MISC 1 application by Does not apply route every morning. 100 each 1  . lidocaine-prilocaine (EMLA) cream Apply thin layer to skin as needed before treatment    . lisinopril (PRINIVIL,ZESTRIL) 40 MG tablet Take 1 tablet (40 mg total) by mouth daily. 90 tablet 1  . metFORMIN (GLUCOPHAGE) 1000 MG tablet Take 1 tablet (1,000 mg total) by mouth 2 (two) times daily with a meal. 180 tablet 3  . Multiple Vitamin (MULTIVITAMIN WITH MINERALS) TABS tablet Take 1 tablet by mouth daily.    . NORVASC 10 MG tablet Take 1 tablet (10 mg total) by mouth daily. 90 tablet 3  . ONETOUCH DELICA LANCETS FINE MISC 1 application by Does not apply route 2 (two) times daily. 100 each 3  . rosuvastatin (CRESTOR) 10 MG tablet Take 1 tablet (10 mg total) by mouth daily. 90 tablet 3  . spironolactone (ALDACTONE) 25 MG tablet Take 25 mg by mouth daily.      Current Facility-Administered Medications  Medication Dose Route Frequency Provider Last Rate Last Dose  . 0.9 %  sodium chloride infusion  500 mL Intravenous Continuous Ladene Artist, MD        Social Hx:  reports that  has never smoked. she has never used smokeless tobacco.   Objective:   BP (!) 142/86   Pulse 70   Temp 98.4 F (36.9 C) (Oral)   Ht '5\' 4"'$  (1.626 m)   Wt 228 lb 3.2 oz (103.5 kg)   SpO2 99%   BMI 39.17 kg/m  Physical Exam  Gen: NAD, alert, cooperative with exam, well-appearing HEENT: NCAT, PERRL, clear conjunctiva, oropharynx clear, supple neck Cardiac: Regular rate and rhythm, normal S1/S2, no murmur, no edema, capillary refill brisk  Respiratory: Clear to auscultation bilaterally, no wheezes, non-labored breathing Gastrointestinal: soft, non tender, non distended, bowel sounds present Skin: no rashes, normal turgor  Neurological: no gross deficits.  MSK: Left trapezius muscle very tense, pain improved with massage  Diabetic Foot Exam - Simple   Simple Foot Form Diabetic Foot exam was performed  with the following findings:  Yes 07/25/2017 10:39 AM  Visual Inspection No deformities, no ulcerations, no other skin breakdown bilaterally:  Yes Sensation Testing Intact to touch and monofilament testing bilaterally:  Yes Pulse Check Posterior Tibialis and Dorsalis pulse intact bilaterally:  Yes Comments     Assessment & Plan:  Hyperlipidemia Discussed patient's last lipid panel with mildly elevated LDL at 147.  Discussed ASCVD risk score of 18% and what that means for her.  She had several questions regarding why her cholesterol is high and what statin therapy could do for her which we discussed. -Begin Crestor 10 mg daily -Will repeat lipid panel in 6 months and can increase Crestor if needed  Diabetes (HCC) A1c controlled  per last check, 6.5 in November 2018. - Follow-up in 1 month for next A1c check -Diabetic foot exam performed today -Continue metformin 1000 mg twice daily  Essential hypertension, benign Uncontrolled today with BP of 142/86.  Patient is compliant with her medications. -Patient will check her BP at home and will let me know if it is greater than 140/90 -Continue home carvedilol 6.25 mg twice daily, lisinopril 40 mg daily, amlodipine 10 mg daily, spironolactone 25 mg daily, Bumex 1 mg daily.  No HCTZ due to history of renal stones - She still has not scheduled her sleep study for possible sleep apnea, discussed importance of this -Follow-up in 3 months if BP controlled at home otherwise will see sooner  Hematuria Persistent hematuria based on the previous labs. -Referral to urology placed  Left trapezius muscle tension: Likely source of patient's pain.  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  She is very concerned that somehow she has swelling in this area and thinks it is related to a bladder infection.  Suspect that she is worried about this because this is what her husband passed away of. -Try to reassure patient that her muscle  tension would resolve with stretching and heating pads, she was not reassured by this and still thinks that it is a blood infection -Collected blood culture for patient reassurance, she was appreciated of of this  Orders Placed This Encounter  Procedures  . Culture, blood (single) w Reflex to ID Panel    Must go downstairs to [facility] to have blood culture drawn  . TSH  . Ambulatory referral to Urology    Referral Priority:   Routine    Referral Type:   Consultation    Referral Reason:   Specialty Services Required    Requested Specialty:   Urology    Number of Visits Requested:   1   Meds ordered this encounter  Medications  . rosuvastatin (CRESTOR) 10 MG tablet    Sig: Take 1 tablet (10 mg total) by mouth daily.    Dispense:  90 tablet    Refill:  3  . glucose blood (ONETOUCH VERIO) test strip    Sig: Check blood sugar 3 x daily    Dispense:  200 each    Refill:  3    For use with One Touch Verio Meter. For questions regarding this prescription please call [phone].    Smitty Cords, MD [place], PGY-3

## 2017-07-26 LAB — TSH: TSH: 1.25 u[IU]/mL (ref 0.450–4.500)

## 2017-07-28 ENCOUNTER — Encounter: Payer: Self-pay | Admitting: Family Medicine

## 2017-07-28 NOTE — Assessment & Plan Note (Addendum)
Uncontrolled today with BP of 142/86.  Patient is compliant with her medications. -Patient will check her BP at home and will let me know if it is greater than 140/90 -Continue home carvedilol 6.25 mg twice daily, lisinopril 40 mg daily, amlodipine 10 mg daily, spironolactone 25 mg daily, Bumex 1 mg daily.  No HCTZ due to history of renal stones - She still has not scheduled her sleep study for possible sleep apnea, discussed importance of this -Follow-up in 3 months if BP controlled at home otherwise will see sooner

## 2017-07-28 NOTE — Assessment & Plan Note (Signed)
A1c controlled per last check, 6.5 in November 2018. - Follow-up in 1 month for next A1c check -Diabetic foot exam performed today -Continue metformin 1000 mg twice daily

## 2017-07-28 NOTE — Assessment & Plan Note (Signed)
Persistent hematuria based on the previous labs. -Referral to urology placed

## 2017-07-31 LAB — CULTURE, BLOOD (SINGLE)

## 2017-08-02 DIAGNOSIS — M7989 Other specified soft tissue disorders: Secondary | ICD-10-CM | POA: Diagnosis not present

## 2017-08-16 DIAGNOSIS — R600 Localized edema: Secondary | ICD-10-CM | POA: Diagnosis not present

## 2017-08-16 DIAGNOSIS — M7989 Other specified soft tissue disorders: Secondary | ICD-10-CM | POA: Diagnosis not present

## 2017-08-30 DIAGNOSIS — C50211 Malignant neoplasm of upper-inner quadrant of right female breast: Secondary | ICD-10-CM | POA: Diagnosis not present

## 2017-08-30 DIAGNOSIS — R202 Paresthesia of skin: Secondary | ICD-10-CM | POA: Diagnosis not present

## 2017-08-30 DIAGNOSIS — Z171 Estrogen receptor negative status [ER-]: Secondary | ICD-10-CM | POA: Diagnosis not present

## 2017-09-05 ENCOUNTER — Other Ambulatory Visit: Payer: Self-pay

## 2017-09-05 ENCOUNTER — Ambulatory Visit (INDEPENDENT_AMBULATORY_CARE_PROVIDER_SITE_OTHER): Payer: 59 | Admitting: Family Medicine

## 2017-09-05 ENCOUNTER — Encounter: Payer: Self-pay | Admitting: Family Medicine

## 2017-09-05 VITALS — BP 128/62 | HR 86 | Temp 98.1°F | Ht 64.0 in | Wt 228.6 lb

## 2017-09-05 DIAGNOSIS — R21 Rash and other nonspecific skin eruption: Secondary | ICD-10-CM

## 2017-09-05 MED ORDER — MUPIROCIN 2 % EX OINT: 1.0000 "application " | TOPICAL_OINTMENT | Freq: Two times a day (BID) | CUTANEOUS | 0 refills | Status: DC

## 2017-09-05 MED ORDER — ROSUVASTATIN CALCIUM 10 MG PO TABS: 10.0000 mg | ORAL_TABLET | Freq: Every day | ORAL | 3 refills | Status: AC

## 2017-09-05 NOTE — Patient Instructions (Addendum)
Thank you for coming in today, it was so nice to see you! Today we talked about:    Rash: I think this is likely folliculitis, I prescribed you an antibiotic ointment Use this twice daily (morning and night) for 1 week  Please follow up in 1 week if your rash does not improve. You can schedule this appointment at the front desk before you leave or call the clinic.  If you have any questions or concerns, please do not hesitate to call the office at [phone]. You can also message me directly via MyChart.   Sincerely,  Smitty Cords, MD   Folliculitis Folliculitis is inflammation of the hair follicles. Folliculitis most commonly occurs on the scalp, thighs, legs, back, and buttocks. However, it can occur anywhere on the body. What are the causes? This condition may be caused by:  A bacterial infection (common).  A fungal infection.  A viral infection.  Coming into contact with certain chemicals, especially oils and tars.  Shaving or waxing.  Applying greasy ointments or creams to your skin often.  Long-lasting folliculitis and folliculitis that keeps coming back can be caused by bacteria that live in the nostrils. What increases the risk? This condition is more likely to develop in people with:  A weakened immune system.  Diabetes.  Obesity.  What are the signs or symptoms? Symptoms of this condition include:  Redness.  Soreness.  Swelling.  Itching.  Small white or yellow, pus-filled, itchy spots (pustules) that appear over a reddened area. If there is an infection that goes deep into the follicle, these may develop into a boil (furuncle).  A group of closely packed boils (carbuncle). These tend to form in hairy, sweaty areas of the body.  How is this diagnosed? This condition is diagnosed with a skin exam. To find what is causing the condition, your health care provider may take a sample of one of the pustules or boils for testing. How is this  treated? This condition may be treated by:  Applying warm compresses to the affected areas.  Taking an antibiotic medicine or applying an antibiotic medicine to the skin.  Applying or bathing with an antiseptic solution.  Taking an over-the-counter medicine to help with itching.  Having a procedure to drain any pustules or boils. This may be done if a pustule or boil contains a lot of pus or fluid.  Laser hair removal. This may be done to treat long-lasting folliculitis.  Follow these instructions at home:  If directed, apply heat to the affected area as often as told by your health care provider. Use the heat source that your health care provider recommends, such as a moist heat pack or a heating pad. ? Place a towel between your skin and the heat source. ? Leave the heat on for 20-30 minutes. ? Remove the heat if your skin turns bright red. This is especially important if you are unable to feel pain, heat, or cold. You may have a greater risk of getting burned.  If you were prescribed an antibiotic medicine, use it as told by your health care provider. Do not stop using the antibiotic even if you start to feel better.  Take over-the-counter and prescription medicines only as told by your health care provider.  Do not shave irritated skin.  Keep all follow-up visits as told by your health care provider. This is important. Get help right away if:  You have more redness, swelling, or pain in the affected area.  Red streaks are spreading from the affected area.  You have a fever. This information is not intended to replace advice given to you by your health care provider. Make sure you discuss any questions you have with your health care provider. Document Released: 09/05/2001 Document Revised: 01/15/2016 Document Reviewed: 04/17/2015 Elsevier Interactive Patient Education  2018 [employer].

## 2017-09-05 NOTE — Progress Notes (Signed)
[facility]  Subjective:    Patient ID: Kaitlyn Cervantes , female   DOB: 11-27-1960 , 57 y.o..   MRN: [id number]  HPI  Kaitlyn Cervantes is here for  Chief Complaint  Patient presents with  . Rash    1. RASH  Had rash for 14 days. Location: left thigh and left buttock Medications tried: nothing Similar rash in past: no Patient believes may be caused by unsure  New medications or antibiotics: have been using clotrimazole Tick, Insect or new pet exposure: No Recent travel: no New detergent or soap: no Immunocompromised: no  Symptoms Itching: yes Pain over rash: no Feeling ill all over: no Fever: no Mouth sores: no Face or tongue swelling: no Trouble breathing: no Joint swelling or pain: no  Review of Symptoms - see HPI PMH - Smoking status noted.    Past Medical History: Patient Active Problem List   Diagnosis Date Noted  . Frequent urination 05/29/2017  . Otitis media with effusion, bilateral 05/29/2017  . Congestive heart failure ([place]) 08/01/2016  . UTI (urinary tract infection) 04/15/2016  . Breast cancer of upper-inner quadrant of right female breast ([place]) 10/19/2015  . Hematuria 08/31/2015  . Constipation 09/19/2014  . Kidney stone 09/19/2014  . Hydronephrosis 08/27/2014  . Hyperlipidemia 10/30/2013  . Diabetes ([place]) 10/29/2013  . Essential hypertension, benign 10/29/2013  . History of obstructive sleep apnea 10/29/2013  . Anemia 10/29/2013    Social Hx:  reports that  has never smoked. she has never used smokeless tobacco.   Objective:   BP 128/62   Pulse 86   Temp 98.1 F (36.7 C) (Oral)   Ht 5\' 4"  (1.626 m)   Wt 228 lb 9.6 oz (103.7 kg)   SpO2 98%   BMI 39.24 kg/m  Physical Exam  Gen: NAD, alert, cooperative with exam, well-appearing Skin: 3-4 51mm eythematous lesions on lateral left thigh with excoriation, no induration or fluctance  Assessment & Plan:   1. Rash and nonspecific skin eruption: Rash most consistent with folliculitis, possibly  bacterial. No signs of cellulitis or abscess.  - Bactroban ointment BID - Follow up in 1 week if no improvement  Smitty Cords, MD [place], PGY-3

## 2017-09-08 ENCOUNTER — Telehealth: Payer: Self-pay | Admitting: Family Medicine

## 2017-09-08 NOTE — Telephone Encounter (Signed)
She was prescribed a cream and needs to know if she puts it on her skin/rash, or in the nostrils (as per pharmacist).  Please call her before we close today to let her know how to use the cream. Best number for her today is [phone] (you may leave her a voicemail w/instructions).

## 2017-09-08 NOTE — Telephone Encounter (Signed)
Called patient back. Left voicemail that the cream should not go on the nostrils but should go on her skin where she has the rash.   Smitty Cords, MD [facility], PGY-3

## 2017-09-13 ENCOUNTER — Other Ambulatory Visit: Payer: Self-pay | Admitting: *Deleted

## 2017-09-13 DIAGNOSIS — E1165 Type 2 diabetes mellitus with hyperglycemia: Secondary | ICD-10-CM

## 2017-09-13 DIAGNOSIS — E118 Type 2 diabetes mellitus with unspecified complications: Secondary | ICD-10-CM

## 2017-09-13 DIAGNOSIS — I10 Essential (primary) hypertension: Secondary | ICD-10-CM

## 2017-09-13 DIAGNOSIS — IMO0001 Reserved for inherently not codable concepts without codable children: Secondary | ICD-10-CM

## 2017-09-13 DIAGNOSIS — IMO0002 Reserved for concepts with insufficient information to code with codable children: Secondary | ICD-10-CM

## 2017-09-13 MED ORDER — LISINOPRIL 40 MG PO TABS: 40.0000 mg | ORAL_TABLET | Freq: Every day | ORAL | 1 refills | Status: DC

## 2017-09-13 MED ORDER — NORVASC 10 MG PO TABS: 10.0000 mg | ORAL_TABLET | Freq: Every day | ORAL | 3 refills | Status: DC

## 2017-09-13 MED ORDER — ONETOUCH DELICA LANCING DEV MISC: 1.0000 "application " | 1 refills | Status: AC

## 2017-09-13 MED ORDER — GLUCOSE BLOOD VI STRP: ORAL_STRIP | 3 refills | Status: AC

## 2017-09-13 MED ORDER — METFORMIN HCL 1000 MG PO TABS: 1000.0000 mg | ORAL_TABLET | Freq: Two times a day (BID) | ORAL | 3 refills | Status: DC

## 2017-09-13 NOTE — Telephone Encounter (Signed)
Patient infection has spread to other parts, including eyes.  She needs to know what to do now.  You may leave voicemail (pt is at work), at [phone].

## 2017-09-13 NOTE — Telephone Encounter (Signed)
Pt called nurse line, states the cream is not helping and now her rash has spread. Would like to know if she needs to do anything else, or come back in for another appointment. Call back number [phone] Wallace Cullens, RN

## 2017-09-13 NOTE — Telephone Encounter (Signed)
I would like for her to be seen. White team please call her and help schedule an appointment. Thank you.   Smitty Cords, MD [place], PGY-3

## 2017-09-14 ENCOUNTER — Telehealth: Payer: Self-pay | Admitting: Family Medicine

## 2017-09-14 NOTE — Telephone Encounter (Signed)
Pt called wanting Gambino to call her personally. She doesn't want to speak to anyone other than Gambino. She told me she wanted to make an appointment with Gambino for Friday 3-8, offered her one of the afternoon appointments and she said she can only do early mornings. Offered her an appointment with another resident on the white team, pt did not want that. Insisted to be seen by only Gambino but her first early morning appointment is 3/20 and pt did not want to wait that long. Pt wants something done now insisted that Gambino call her today. Please advise

## 2017-09-14 NOTE — Telephone Encounter (Signed)
Pt called nurse line wanting to speak to Dr. Juanito Doom. See below message Wallace Cullens, RN

## 2017-09-14 NOTE — Telephone Encounter (Signed)
Please see previous note. Ottis Stain, CMA

## 2017-09-14 NOTE — Telephone Encounter (Signed)
Returned patient's call. No answer. Left voicemail that I would like for her to schedule an appt ASAP so we can help take care of her rash.   Smitty Cords, MD [place], PGY-3

## 2017-09-15 ENCOUNTER — Ambulatory Visit: Payer: 59 | Admitting: Family Medicine

## 2017-09-22 ENCOUNTER — Ambulatory Visit (INDEPENDENT_AMBULATORY_CARE_PROVIDER_SITE_OTHER): Payer: 59 | Admitting: Student in an Organized Health Care Education/Training Program

## 2017-09-22 ENCOUNTER — Encounter: Payer: Self-pay | Admitting: Student in an Organized Health Care Education/Training Program

## 2017-09-22 ENCOUNTER — Other Ambulatory Visit: Payer: Self-pay

## 2017-09-22 VITALS — BP 162/88 | HR 74 | Temp 98.4°F | Ht 64.0 in | Wt 230.6 lb

## 2017-09-22 DIAGNOSIS — R52 Pain, unspecified: Secondary | ICD-10-CM | POA: Diagnosis not present

## 2017-09-22 DIAGNOSIS — L299 Pruritus, unspecified: Secondary | ICD-10-CM | POA: Diagnosis not present

## 2017-09-22 MED ORDER — HYDROXYZINE HCL 10 MG PO TABS: 10.0000 mg | ORAL_TABLET | Freq: Three times a day (TID) | ORAL | 0 refills | Status: DC | PRN

## 2017-09-22 MED ORDER — FLUTICASONE PROPIONATE 50 MCG/ACT NA SUSP: 2.0000 | Freq: Every day | NASAL | 6 refills | Status: DC

## 2017-09-22 NOTE — Assessment & Plan Note (Signed)
Uncertain etiology. Discomfort described as itching "on the inside" and pain that comes and goes. Considering cholestasis, myositis/myalgias, cancer-related discomfort (s/p chemotherapy for breast cancer that was completed 1 year ago), allergic, iatrogenic, or psychiatric causes for discomfort. - Initial workup with CMP, CK, Sed rate, CBC - gave return precautions/ red flags including abdominal pain or rash - Patient to keep diary of symptoms to help [place] understand when they come on - follow up with PCP in 1-2 weeks

## 2017-09-22 NOTE — Progress Notes (Signed)
CC: generalized itching "on the inside" and nonspecific generalized pain  HPI: Kaitlyn Cervantes is a 57 y.o. female with PMH significant for breast cancer, HLD, HTN who presents to [facility] today for a same day appointment with discomfort of several months duration.  Generalized Itching Patient previously seen for rash on 2/26 which was thought to be consistent with folliculitis, no signs of cellulitis or abscess at that time. She was treated with bactroban ointment at that time.  Today she reports "itching on the inside" localized over right thigh and right buttock, but also sometimes in her arms and in her eyes. She also notices ?achy pain over that area (she is unable to specifically describe pain). Medications tried: bactroban ointment Patient believes may be caused by anesthesia from a dental procedure 6 months ago, otherwise she cannot think of any  New medications or exposures.  New medications or antibiotics: finished chemotherapy 1 year ago Tick, Insect or new pet exposure: none Recent travel: none New detergent or soap: none Immunocompromised: none  Symptoms Itching: yes (see above) Feeling ill all over: no Fever: none Mouth sores: none Face or tongue swelling: none Trouble breathing: none Joint swelling or pain: none  Review of Symptoms - see HPI PMH - Smoking status noted.    Generalized Achiness/Pain Noted in her arms, bilaterally, thighs bilaterally. She cannot give a pattern of pain, just that it comes and goes nonspecifically. No history of injury or identifiable triggers. This has been going on for several months.  She is concerned something is wrong "on the inside."  Review of Symptoms:  See HPI for ROS.   CC, SH/smoking status, and VS noted.  Objective: BP (!) 162/88   Pulse 74   Temp 98.4 F (36.9 C) (Oral)   Ht 5\' 4"  (1.626 m)   Wt 230 lb 9.6 oz (104.6 kg)   SpO2 99%   BMI 39.58 kg/m  GEN: NAD, alert, cooperative, and pleasant. EYE: no conjunctival  injection, pupils equally round and reactive to light ENMT: normal tympanic light reflex, no nasal polyps,no rhinorrhea, no pharyngeal erythema or exudates NECK: full ROM, no thyromegaly RESPIRATORY: clear to auscultation bilaterally with no wheezes, rhonchi or rales, good effort CV: RRR, no m/r/g, no peripheral edema GI: soft, non-tender, non-distended, no hepatosplenomegaly SKIN: warm and dry, no rash. +1-2 small abrasions like scratches, very superficial, noted over right thigh. No other lesions identified. No redness or surrounding erythema, no drainage. NEURO: II-XII grossly intact, normal gait, peripheral sensation intact PSYCH: AAOx3, appropriate affect  Assessment and plan:  Generalized pain and itching Uncertain etiology. Discomfort described as itching "on the inside" and pain that comes and goes. Considering cholestasis, myositis/myalgias, cancer-related discomfort (s/p chemotherapy for breast cancer that was completed 1 year ago), allergic, iatrogenic, or psychiatric causes for discomfort. - Initial workup with CMP, CK, Sed rate, CBC - gave return precautions/ red flags including abdominal pain or rash - Patient to keep diary of symptoms to help [place] understand when they come on - follow up with PCP in 1-2 weeks   Orders Placed This Encounter  Procedures  . CBC With Differential  . Platelet count  . Comprehensive metabolic panel    Order Specific Question:   Has the patient fasted?    Answer:   No  . Sedimentation Rate  . CK    Meds ordered this encounter  Medications  . fluticasone (FLONASE) 50 MCG/ACT nasal spray    Sig: Place 2 sprays into both nostrils daily.  Dispense:  16 g    Refill:  6  . hydrOXYzine (ATARAX/VISTARIL) 10 MG tablet    Sig: Take 1 tablet (10 mg total) by mouth 3 (three) times daily as needed for itching.    Dispense:  30 tablet    Refill:  0     Everrett Coombe, MD,MS,  PGY2 09/22/2017 10:52 AM

## 2017-09-22 NOTE — Patient Instructions (Signed)
It was a pleasure seeing you today in our clinic. Here is the treatment plan we have discussed and agreed upon together:  We drew blood work at today's visit. I will call or send you a letter with these results. If you do not hear from me within the next week, please give our office a call.  Our clinic's number is [phone]. Please call with questions or concerns about what we discussed today.  Be well, Dr. Dalisha Shively   

## 2017-09-23 LAB — CBC WITH DIFFERENTIAL
Basophils Absolute: 0 10*3/uL (ref 0.0–0.2)
Basos: 0 %
EOS (ABSOLUTE): 0.1 10*3/uL (ref 0.0–0.4)
EOS: 3 %
HEMATOCRIT: 37.6 % (ref 34.0–46.6)
Hemoglobin: 12.4 g/dL (ref 11.1–15.9)
IMMATURE GRANULOCYTES: 0 %
Immature Grans (Abs): 0 10*3/uL (ref 0.0–0.1)
Lymphocytes Absolute: 1.2 10*3/uL (ref 0.7–3.1)
Lymphs: 30 %
MCH: 26.7 pg (ref 26.6–33.0)
MCHC: 33 g/dL (ref 31.5–35.7)
MCV: 81 fL (ref 79–97)
MONOS ABS: 0.4 10*3/uL (ref 0.1–0.9)
Monocytes: 11 %
NEUTROS PCT: 56 %
Neutrophils Absolute: 2.2 10*3/uL (ref 1.4–7.0)
RBC: 4.64 x10E6/uL (ref 3.77–5.28)
RDW: 14.5 % (ref 12.3–15.4)
WBC: 3.9 10*3/uL (ref 3.4–10.8)

## 2017-09-23 LAB — COMPREHENSIVE METABOLIC PANEL
ALK PHOS: 121 IU/L — AB (ref 39–117)
ALT: 14 IU/L (ref 0–32)
AST: 19 IU/L (ref 0–40)
Albumin/Globulin Ratio: 1.4 (ref 1.2–2.2)
Albumin: 3.9 g/dL (ref 3.5–5.5)
BUN/Creatinine Ratio: 13 (ref 9–23)
BUN: 13 mg/dL (ref 6–24)
Bilirubin Total: 0.4 mg/dL (ref 0.0–1.2)
CO2: 22 mmol/L (ref 20–29)
CREATININE: 0.97 mg/dL (ref 0.57–1.00)
Calcium: 9.7 mg/dL (ref 8.7–10.2)
Chloride: 103 mmol/L (ref 96–106)
GFR calc Af Amer: 75 mL/min/{1.73_m2} (ref >59)
GFR calc non Af Amer: 65 mL/min/{1.73_m2} (ref >59)
GLUCOSE: 133 mg/dL — AB (ref 65–99)
Globulin, Total: 2.7 g/dL (ref 1.5–4.5)
Potassium: 4.5 mmol/L (ref 3.5–5.2)
Sodium: 143 mmol/L (ref 134–144)
Total Protein: 6.6 g/dL (ref 6.0–8.5)

## 2017-09-23 LAB — PLATELET COUNT: Platelets: 266 10*3/uL (ref 150–379)

## 2017-09-23 LAB — SEDIMENTATION RATE: SED RATE: 21 mm/h (ref 0–40)

## 2017-09-23 LAB — CK: Total CK: 89 U/L (ref 24–173)

## 2017-10-20 ENCOUNTER — Ambulatory Visit: Payer: 59 | Admitting: Family Medicine

## 2017-10-25 ENCOUNTER — Ambulatory Visit (INDEPENDENT_AMBULATORY_CARE_PROVIDER_SITE_OTHER): Payer: 59 | Admitting: Internal Medicine

## 2017-10-25 ENCOUNTER — Other Ambulatory Visit: Payer: Self-pay

## 2017-10-25 ENCOUNTER — Encounter: Payer: Self-pay | Admitting: Internal Medicine

## 2017-10-25 DIAGNOSIS — R52 Pain, unspecified: Secondary | ICD-10-CM | POA: Diagnosis not present

## 2017-10-25 MED ORDER — CAPSAICIN 0.075 % EX GEL: CUTANEOUS | 1 refills | Status: DC

## 2017-10-25 NOTE — Patient Instructions (Signed)
It was so nice to see you today!  I am sorry you are still having burning pain. All of your labs from your last visit were normal.  I have sent in a prescription for Capsaicin cream. You can apply this to the leg 3-4 times daily.  If you feel like your nerve pain is not getting better, please come back to see [place]. We can discuss starting a nerve pain medication such as Gabapentin.  -Dr. Brett Albino

## 2017-10-26 NOTE — Assessment & Plan Note (Signed)
She describes the pain as "burning pain" throughout her legs, but mostly in the bilateral groins, ankles and left lateral thigh. Description seems to be consistent with neuropathy. The pain started right after she finished chemotherapy for breast cancer. Per onc notes, she was on Paclitaxel, which has a known side effect of peripheral neuropathy. The left lateral thigh pain began as a rash, so wonder if she had Shingles originally and is now experiencing post-herpetic neuralgia. Distribution of pain is not consistent with diabetic neuropathy and this is also less likely due to patient's relatively well-controlled blood sugars. Cholestasis and myositis unlikely with recent normal labs. - Prescribed Capsaicin cream to use as needed to left lateral thigh (where the pain is the worst) - Follow-up with PCP if no improvement. Would consider trial of Gabapentin to see if this helps.

## 2017-10-26 NOTE — Progress Notes (Signed)
   [facility] Phone: [phone]  Subjective:  Kaitlyn Cervantes is a 57 year old female presenting to clinic with "burning pain" in her left lateral thigh. She was seen in clinic on 09/05/17 with a rash of that area. She was thought to have a folliculitis and was prescribed Bactroban ointment. She returned to clinic on 09/22/17 with "achy" pain in the area where the rash was, as well as generalized pain throughout her legs and buttocks. She had some basic labs drawn at that visit- CBC, CMP, CK, ESR- all of which were normal. She returns to clinic today with continued "burning pain" where the rash was. She is also having some burning pain in her ankles and bilateral groin/upper thigh area. She states she has been having generalized burning pain in her legs since December 2018 when she was on chemotherapy for breast cancer. She denies any new rashes. No fevers. No chills.  ROS: See HPI for pertinent positives and negatives  Past Medical History- CHF, HTN, T2DM, recent hx of breast cancer s/p chemo, HLD  Family history reviewed for today's visit. No changes.  Social history- patient is a never smoker  Objective: BP (!) 141/82   Pulse 81   Temp 98.5 F (36.9 C) (Oral)   Wt 232 lb (105.2 kg)   SpO2 99%   BMI 39.82 kg/m  Gen: NAD, alert, cooperative with exam Msk: No edema, warm, normal tone, moves UE/LE spontaneously Neuro: Alert and oriented, no gross deficits, sensation intact to light touch Skin: Skin on legs is normal in appearance, no rashes, no hyperpigmentation  Assessment/Plan: Generalized Pain and Itching: She describes the pain as "burning pain" throughout her legs, but mostly in the bilateral groins, ankles and left lateral thigh. Description seems to be consistent with neuropathy. The pain started right after she finished chemotherapy for breast cancer. Per onc notes, she was on Paclitaxel, which has a known side effect of peripheral neuropathy. The left lateral  thigh pain began as a rash, so wonder if she had Shingles originally and is now experiencing post-herpetic neuralgia. Distribution of pain is not consistent with diabetic neuropathy and this is also less likely due to patient's relatively well-controlled blood sugars. Cholestasis and myositis unlikely with recent normal labs. - Prescribed Capsaicin cream to use as needed to left lateral thigh (where the pain is the worst) - Follow-up with PCP if no improvement. Would consider trial of Gabapentin to see if this helps.  Hyman Bible, MD PGY-3

## 2017-11-29 DIAGNOSIS — C50211 Malignant neoplasm of upper-inner quadrant of right female breast: Secondary | ICD-10-CM | POA: Diagnosis not present

## 2017-11-29 DIAGNOSIS — Z171 Estrogen receptor negative status [ER-]: Secondary | ICD-10-CM | POA: Diagnosis not present

## 2017-12-13 ENCOUNTER — Emergency Department (HOSPITAL_COMMUNITY)
Admission: EM | Admit: 2017-12-13 | Discharge: 2017-12-13 | Disposition: A | Discharge status: Home / Self Care | Payer: 59 | Attending: Emergency Medicine | Admitting: Emergency Medicine

## 2017-12-13 ENCOUNTER — Other Ambulatory Visit: Payer: Self-pay

## 2017-12-13 ENCOUNTER — Encounter (HOSPITAL_COMMUNITY): Payer: Self-pay | Admitting: Emergency Medicine

## 2017-12-13 DIAGNOSIS — Z923 Personal history of irradiation: Secondary | ICD-10-CM | POA: Diagnosis not present

## 2017-12-13 DIAGNOSIS — E119 Type 2 diabetes mellitus without complications: Secondary | ICD-10-CM | POA: Diagnosis not present

## 2017-12-13 DIAGNOSIS — Z9221 Personal history of antineoplastic chemotherapy: Secondary | ICD-10-CM | POA: Diagnosis not present

## 2017-12-13 DIAGNOSIS — Z853 Personal history of malignant neoplasm of breast: Secondary | ICD-10-CM | POA: Insufficient documentation

## 2017-12-13 DIAGNOSIS — I509 Heart failure, unspecified: Secondary | ICD-10-CM | POA: Insufficient documentation

## 2017-12-13 DIAGNOSIS — S40862A Insect bite (nonvenomous) of left upper arm, initial encounter: Secondary | ICD-10-CM | POA: Diagnosis not present

## 2017-12-13 DIAGNOSIS — L03114 Cellulitis of left upper limb: Secondary | ICD-10-CM | POA: Diagnosis not present

## 2017-12-13 DIAGNOSIS — Y998 Other external cause status: Secondary | ICD-10-CM | POA: Insufficient documentation

## 2017-12-13 DIAGNOSIS — Z79899 Other long term (current) drug therapy: Secondary | ICD-10-CM | POA: Insufficient documentation

## 2017-12-13 DIAGNOSIS — Z7982 Long term (current) use of aspirin: Secondary | ICD-10-CM | POA: Insufficient documentation

## 2017-12-13 DIAGNOSIS — Y929 Unspecified place or not applicable: Secondary | ICD-10-CM | POA: Insufficient documentation

## 2017-12-13 DIAGNOSIS — Z7984 Long term (current) use of oral hypoglycemic drugs: Secondary | ICD-10-CM | POA: Insufficient documentation

## 2017-12-13 DIAGNOSIS — W57XXXA Bitten or stung by nonvenomous insect and other nonvenomous arthropods, initial encounter: Secondary | ICD-10-CM | POA: Diagnosis not present

## 2017-12-13 DIAGNOSIS — I110 Hypertensive heart disease with heart failure: Secondary | ICD-10-CM | POA: Insufficient documentation

## 2017-12-13 DIAGNOSIS — Y9389 Activity, other specified: Secondary | ICD-10-CM | POA: Diagnosis not present

## 2017-12-13 MED ORDER — DOXYCYCLINE HYCLATE 100 MG PO CAPS: 100.0000 mg | ORAL_CAPSULE | Freq: Two times a day (BID) | ORAL | 0 refills | Status: DC

## 2017-12-13 NOTE — ED Provider Notes (Signed)
[facility] EMERGENCY DEPARTMENT Provider Note   CSN: [id number] Arrival date & time: 12/13/17  1924     History   Chief Complaint Chief Complaint  Patient presents with  . Insect Bite    HPI Kaitlyn Cervantes is a 57 y.o. female presenting for evaluation of insect bite on the left arm.  Patient states that she noticed an insect bite on her left upper arm on Sunday.  She had her daughter take a take off of the area.  She reports continued irritation, redness, and warmth of the area.  She denies fevers, chills, chest pain, shortness of breath, nausea, vomiting, abdominal pain.  She finished chemo/radiation a year ago, is not on immunosuppressive drugs at this time.  She is not on blood thinners.  She denies difficulty moving her arm or significant pain.  HPI  Past Medical History:  Diagnosis Date  . Anemia 2009  . Blood transfusion without reported diagnosis 2009  . Breast cancer of upper-inner quadrant of right female breast ([place]) 10/19/2015   BRCA I & II negative  . Diabetes mellitus without complication ([place])   . Gallstones    has not had removed  . History of stomach ulcers    treated   . Hypertension   . Kidney stones   . Sleep apnea 2005    Patient Active Problem List   Diagnosis Date Noted  . Generalized pain and itching 09/22/2017  . Frequent urination 05/29/2017  . Congestive heart failure ([place]) 08/01/2016  . Breast cancer of upper-inner quadrant of right female breast ([place]) 10/19/2015  . Hematuria 08/31/2015  . Constipation 09/19/2014  . Kidney stone 09/19/2014  . Hydronephrosis 08/27/2014  . Hyperlipidemia 10/30/2013  . Diabetes ([place]) 10/29/2013  . Essential hypertension, benign 10/29/2013  . History of obstructive sleep apnea 10/29/2013  . Anemia 10/29/2013    Past Surgical History:  Procedure Laterality Date  . CESAREAN SECTION     x3  . kidney stones  2007  . right mastectomy     07/2016  . SPLENECTOMY     age 33      OB  History    Gravida  3   Para  3   Term      Preterm      AB      Living  3     SAB      TAB      Ectopic      Multiple      Live Births               Home Medications    Prior to Admission medications   Medication Sig Start Date End Date Taking? Authorizing Provider  aspirin EC 81 MG tablet Take 81 mg by mouth daily.    [provider]  Blood Glucose Monitoring Suppl (ONETOUCH VERIO) w/Device KIT 1 kit by Does not apply route every morning. 06/13/17   Carlyle Dolly, MD  bumetanide (BUMEX) 1 MG tablet Take 1 mg by mouth daily.  07/15/16   [provider]  Capsaicin 0.075 % GEL Apply to affected area 3-4 times daily 10/25/17   Mayo, Pete Pelt, MD  carvedilol (COREG) 6.25 MG tablet Take 6.25 mg by mouth 2 (two) times daily with a meal.  01/01/16   [provider]  Cholecalciferol (VITAMIN D3) 5000 units TABS Take 5,000 Units by mouth daily.    [provider]  Coenzyme Q10 (COQ10) 100 MG CAPS Take 1  capsule by mouth daily.    [provider]  doxycycline (VIBRAMYCIN) 100 MG capsule Take 1 capsule (100 mg total) by mouth 2 (two) times daily. 12/13/17   , , PA-C  fluticasone (FLONASE) 50 MCG/ACT nasal spray Place 2 sprays into both nostrils daily. 09/22/17   Everrett Coombe, MD  Garlic 2595 MG CAPS Take 1 capsule by mouth daily.    [provider]  glucose blood (ONETOUCH VERIO) test strip Check blood sugar 3 x daily 09/13/17   Carlyle Dolly, MD  hydrOXYzine (ATARAX/VISTARIL) 10 MG tablet Take 1 tablet (10 mg total) by mouth 3 (three) times daily as needed for itching. 09/22/17   Everrett Coombe, MD  Lancet Devices (ONE TOUCH DELICA LANCING DEV) MISC 1 application by Does not apply route every morning. 09/13/17   Carlyle Dolly, MD  lidocaine-prilocaine (EMLA) cream Apply thin layer to skin as needed before treatment 05/11/16   [provider]  lisinopril (PRINIVIL,ZESTRIL) 40 MG tablet Take 1  tablet (40 mg total) by mouth daily. 09/13/17   Carlyle Dolly, MD  metFORMIN (GLUCOPHAGE) 1000 MG tablet Take 1 tablet (1,000 mg total) by mouth 2 (two) times daily with a meal. 09/13/17   Carlyle Dolly, MD  Multiple Vitamin (MULTIVITAMIN WITH MINERALS) TABS tablet Take 1 tablet by mouth daily.    [provider]  mupirocin ointment (BACTROBAN) 2 % Place 1 application into the nose 2 (two) times daily. 09/05/17   Carlyle Dolly, MD  NORVASC 10 MG tablet Take 1 tablet (10 mg total) by mouth daily. 09/13/17   Carlyle Dolly, MD  [facility] DELICA LANCETS FINE MISC 1 application by Does not apply route 2 (two) times daily. 06/14/17   Carlyle Dolly, MD  rosuvastatin (CRESTOR) 10 MG tablet Take 1 tablet (10 mg total) by mouth daily. 09/05/17   Carlyle Dolly, MD  spironolactone (ALDACTONE) 25 MG tablet Take 25 mg by mouth daily.  01/01/16   [provider]    Family History Family History  Problem Relation Age of Onset  . Breast cancer Mother        x2 39's & 33's  . Cancer Mother   . Heart disease Father   . Cancer Sister   . Cancer Maternal Aunt   . Breast cancer Maternal Aunt        mid 41's  . Colon polyps Neg Hx   . Colon cancer Neg Hx   . Esophageal cancer Neg Hx   . Rectal cancer Neg Hx   . Stomach cancer Neg Hx     Social History Social History   Tobacco Use  . Smoking status: Never Smoker  . Smokeless tobacco: Never Used  Substance Use Topics  . Alcohol use: No    Comment: very rare  . Drug use: No     Allergies   Iohexol and Other   Review of Systems Review of Systems  Constitutional: Negative for fever.  Skin: Positive for color change and rash.     Physical Exam Updated Vital Signs BP (!) 171/78   Pulse 68   Temp 97.9 F (36.6 C) (Oral)   Resp 16   Wt 105.2 kg (232 lb)   SpO2 99%   BMI 39.82 kg/m   Physical Exam  Constitutional: She is oriented to person, place, and time. She appears well-developed  and well-nourished. No distress.  HENT:  Head: Normocephalic and atraumatic.  Eyes: EOM are normal.  Neck: Normal range  of motion.  Pulmonary/Chest: Effort normal.  Abdominal: She exhibits no distension.  Musculoskeletal: Normal range of motion. She exhibits no tenderness.  Strength of upper extremities intact bilaterally.  Radial pulses intact bilaterally.  Neurological: She is alert and oriented to person, place, and time.  Skin: Skin is warm. Rash noted.  Mild erythema and warmth of left upper medial arm without streaking.  No area of fluctuance or induration.  No active drainage.  No tenderness. No streaking  Psychiatric: She has a normal mood and affect.  Nursing note and vitals reviewed.    ED Treatments / Results  Labs (all labs ordered are listed, but only abnormal results are displayed) Labs Reviewed - No data to display  EKG None  Radiology No results found.   EMERGENCY DEPARTMENT US SOFT TISSUE INTERPRETATION "Study: Limited Soft Tissue Ultrasound"  INDICATIONS: Soft tissue infection Multiple views of the body part were obtained in real-time with a multi-frequency linear probe  PERFORMED BY: Myself IMAGES ARCHIVED?: Yes ([place] battery may have died prior to saving) SIDE:Left BODY PART:Upper extremity INTERPRETATION:  No abcess noted   Procedures Procedures (including critical care time)  Medications Ordered in ED Medications - No data to display   Initial Impression / Assessment and Plan / ED Course  I have reviewed the triage vital signs and the nursing notes.  Pertinent labs & imaging results that were available during my care of the patient were reviewed by me and considered in my medical decision making (see chart for details).      Patient presenting for evaluation of insect bite of the left upper arm.  Physical exam shows erythema and warmth of the surrounding area without tenderness, fluctuance, or induration.  Doubt abscess.  Ultrasound obtained  to rule out abscess.  Will cover for possible cellulitis, although patient without pain.  Discussed follow-up with primary care as needed and for further evaluation of blood pressure.  Blood pressure elevated in the ER, although improved without intervention.  At this time, patient appears safe for discharge.  Return precautions given.  Patient states she understands and agrees to plan.  Final Clinical Impressions(s) / ED Diagnoses   Final diagnoses:  Cellulitis of left upper extremity    ED Discharge Orders        Ordered    doxycycline (VIBRAMYCIN) 100 MG capsule  2 times daily     12/13/17 2123       , , PA-C 12/13/17 2134    Deno Etienne, DO 12/14/17 1157

## 2017-12-13 NOTE — ED Notes (Signed)
Pt left after talking to PA. Pt did not sign out.

## 2017-12-13 NOTE — ED Notes (Signed)
Pt wants to see Provider to make sure bug bite is not poisonous.

## 2017-12-13 NOTE — ED Triage Notes (Signed)
Pt was working outside Saturday and noticed a bite on her L upper arm. Site appears red and swollen. No insect found.

## 2017-12-13 NOTE — ED Notes (Signed)
Pt verbalized understanding discharge instructions and denies any further needs or questions at this time. VS stable, ambulatory and steady gait.   

## 2017-12-13 NOTE — Discharge Instructions (Signed)
Take antibiotics as prescribed.  Take the entire course, even if your symptoms improve. Follow-up with your primary care doctor or back in the emergency room if your symptoms are not improving, you are still having fevers, or with any worsening of symptoms after 2 days of being on antibiotics. Return to the emergency room as needed.

## 2017-12-15 DIAGNOSIS — Z171 Estrogen receptor negative status [ER-]: Secondary | ICD-10-CM | POA: Diagnosis not present

## 2017-12-15 DIAGNOSIS — I119 Hypertensive heart disease without heart failure: Secondary | ICD-10-CM | POA: Diagnosis not present

## 2017-12-15 DIAGNOSIS — I5033 Acute on chronic diastolic (congestive) heart failure: Secondary | ICD-10-CM | POA: Diagnosis not present

## 2017-12-15 DIAGNOSIS — Z853 Personal history of malignant neoplasm of breast: Secondary | ICD-10-CM | POA: Diagnosis not present

## 2017-12-15 DIAGNOSIS — C50211 Malignant neoplasm of upper-inner quadrant of right female breast: Secondary | ICD-10-CM | POA: Diagnosis not present

## 2017-12-15 DIAGNOSIS — Z9011 Acquired absence of right breast and nipple: Secondary | ICD-10-CM | POA: Diagnosis not present

## 2017-12-22 ENCOUNTER — Encounter: Payer: Self-pay | Admitting: Obstetrics & Gynecology

## 2017-12-22 ENCOUNTER — Ambulatory Visit (INDEPENDENT_AMBULATORY_CARE_PROVIDER_SITE_OTHER): Payer: 59 | Admitting: Obstetrics & Gynecology

## 2017-12-22 VITALS — BP 132/88 | Ht 63.0 in | Wt 232.0 lb

## 2017-12-22 DIAGNOSIS — Z01419 Encounter for gynecological examination (general) (routine) without abnormal findings: Secondary | ICD-10-CM

## 2017-12-22 DIAGNOSIS — R102 Pelvic and perineal pain: Secondary | ICD-10-CM | POA: Diagnosis not present

## 2017-12-22 DIAGNOSIS — C50211 Malignant neoplasm of upper-inner quadrant of right female breast: Secondary | ICD-10-CM | POA: Diagnosis not present

## 2017-12-22 DIAGNOSIS — Z780 Asymptomatic menopausal state: Secondary | ICD-10-CM | POA: Diagnosis not present

## 2017-12-22 DIAGNOSIS — Z6841 Body Mass Index (BMI) 40.0 and over, adult: Secondary | ICD-10-CM

## 2017-12-22 DIAGNOSIS — Z13820 Encounter for screening for osteoporosis: Secondary | ICD-10-CM

## 2017-12-22 DIAGNOSIS — Z171 Estrogen receptor negative status [ER-]: Secondary | ICD-10-CM

## 2017-12-22 NOTE — Progress Notes (Signed)
Kaitlyn Cervantes 1961/01/12 [id number]   History:    57 y.o. G3P3L3 Widowed  RP:  Established patient presenting for annual gyn exam   HPI: Menopause, well without HRT.  No PMB.  C/O intermittent pelvic discomfort coming once or twice most days.  The discomfort is in the mid pelvis, not associated with BMs or bladder function per patient.  No UTI Sx.  No constipation.  Normal vaginal secretions.  Abstinent x husband passed away.  Rt breast Ca (ER neg), s/p Rt Mastectomy.  BMI 41.10.  No fitness activity.  Health labs with Fam MD.  Past medical history,surgical history, family history and social history were all reviewed and documented in the EPIC chart.  Gynecologic History No LMP recorded. Patient is postmenopausal. Contraception: abstinence and post menopausal status Last Pap: 12/2016. Results were: Negative, HPV HR neg Last mammogram: 11/2017. Results were: Will obtain report from [facility] Bone Density: Never, will schedule here now. Colonoscopy: 2018  Obstetric History OB History  Gravida Para Term Preterm AB Living  3 3       3   SAB TAB Ectopic Multiple Live Births               # Outcome Date GA Lbr Len/2nd Weight Sex Delivery Anes PTL Lv  3 Para           2 Para           1 Para              ROS: A ROS was performed and pertinent positives and negatives are included in the history.  GENERAL: No fevers or chills. HEENT: No change in vision, no earache, sore throat or sinus congestion. NECK: No pain or stiffness. CARDIOVASCULAR: No chest pain or pressure. No palpitations. PULMONARY: No shortness of breath, cough or wheeze. GASTROINTESTINAL: No abdominal pain, nausea, vomiting or diarrhea, melena or bright red blood per rectum. GENITOURINARY: No urinary frequency, urgency, hesitancy or dysuria. MUSCULOSKELETAL: No joint or muscle pain, no back pain, no recent trauma. DERMATOLOGIC: No rash, no itching, no lesions. ENDOCRINE: No polyuria, polydipsia, no heat or cold intolerance. No  recent change in weight. HEMATOLOGICAL: No anemia or easy bruising or bleeding. NEUROLOGIC: No headache, seizures, numbness, tingling or weakness. PSYCHIATRIC: No depression, no loss of interest in normal activity or change in sleep pattern.     Exam:   BP 132/88   Ht 5\' 3"  (1.6 m)   Wt 232 lb (105.2 kg)   BMI 41.10 kg/m   Body mass index is 41.1 kg/m.  General appearance : Well developed well nourished female. No acute distress HEENT: Eyes: no retinal hemorrhage or exudates,  Neck supple, trachea midline, no carotid bruits, no thyroidmegaly Lungs: Clear to auscultation, no rhonchi or wheezes, or rib retractions  Heart: Regular rate and rhythm, no murmurs or gallops Breast:Examined in sitting and supine position were symmetrical in appearance, no palpable masses or tenderness,  no skin retraction, no nipple inversion, no nipple discharge, no skin discoloration, no axillary or supraclavicular lymphadenopathy Abdomen: no palpable masses or tenderness, no rebound or guarding Extremities: no edema or skin discoloration or tenderness  Pelvic: Vulva: Normal             Vagina: No gross lesions or discharge  Cervix: No gross lesions or discharge.  Pap reflex done.  Uterus  AV, normal size, shape and consistency, non-tender and mobile  Adnexa  Without masses or tenderness  Anus: Normal   Assessment/Plan:  56  y.o. female for annual exam   1. Encounter for routine gynecological examination with Papanicolaou smear of cervix Normal gynecologic exam.  Pap reflex done.  Breast exam normal on the left, status post right mastectomy.  Health labs with family physician.  Colonoscopy 2018.  2. Menopause present Well on no hormone replacement therapy.  No postmenopausal bleeding.  Abstinent.  3. Pelvic pain in female Complains of persistent intermittent pelvic discomfort.  Gynecologic exam limited by obesity.  Decision to proceed with a pelvic ultrasound to better assess the uterus and  ovaries. - US Transvaginal Non-OB; Future  4. Malignant neoplasm of upper-inner quadrant of right breast in female, estrogen receptor negative ([place]) Right breast cancer estrogen receptor negative.  Status post right mastectomy.  Will obtain recent screening mammogram done in May 2019 at [facility].  5. Class 3 severe obesity due to excess calories without serious comorbidity with body mass index (BMI) of 40.0 to 44.9 in adult [facility]) Recommend low calorie/low carb diet such as [employer].  Recommend increased physical activity with aerobic activities 5 times a week and weightlifting every 2 days.  6. Screening for osteoporosis Follow-up for screening bone density.  Recommend vitamin D supplements, calcium rich nutrition and regular weightbearing physical activity. - DG Bone Density; Future  Counseling on above issues and coordination of care more than 50% for 10 minutes.  Princess Bruins MD, 10:50 AM 12/22/2017

## 2017-12-22 NOTE — Patient Instructions (Signed)
1. Encounter for routine gynecological examination with Papanicolaou smear of cervix Normal gynecologic exam.  Pap reflex done.  Breast exam normal on the left, status post right mastectomy.  Health labs with family physician.  Colonoscopy 2018.  2. Menopause present Well on no hormone replacement therapy.  No postmenopausal bleeding.  Abstinent.  3. Pelvic pain in female Complains of persistent intermittent pelvic discomfort.  Gynecologic exam limited by obesity.  Decision to proceed with a pelvic ultrasound to better assess the uterus and ovaries. - US Transvaginal Non-OB; Future  4. Malignant neoplasm of upper-inner quadrant of right breast in female, estrogen receptor negative ([place]) Right breast cancer estrogen receptor negative.  Status post right mastectomy.  Will obtain recent screening mammogram done in May 2019 at [facility].  5. Class 3 severe obesity due to excess calories without serious comorbidity with body mass index (BMI) of 40.0 to 44.9 in adult [facility]) Recommend low calorie/low carb diet such as [employer].  Recommend increased physical activity with aerobic activities 5 times a week and weightlifting every 2 days.  6. Screening for osteoporosis Follow-up for screening bone density.  Recommend vitamin D supplements, calcium rich nutrition and regular weightbearing physical activity. - DG Bone Density; Future  Kaitlyn Cervantes, it was a pleasure seeing you today!  I will inform you of your results as soon as they are available.

## 2017-12-22 NOTE — Addendum Note (Signed)
Addended by: Thurnell Garbe A on: 12/22/2017 01:35 PM   Modules accepted: Orders

## 2017-12-25 ENCOUNTER — Other Ambulatory Visit: Payer: Self-pay | Admitting: Gynecology

## 2017-12-25 DIAGNOSIS — Z13820 Encounter for screening for osteoporosis: Secondary | ICD-10-CM

## 2017-12-25 LAB — PAP IG W/ RFLX HPV ASCU

## 2017-12-28 DIAGNOSIS — I5033 Acute on chronic diastolic (congestive) heart failure: Secondary | ICD-10-CM | POA: Diagnosis not present

## 2017-12-28 DIAGNOSIS — I051 Rheumatic mitral insufficiency: Secondary | ICD-10-CM | POA: Diagnosis not present

## 2017-12-28 DIAGNOSIS — I358 Other nonrheumatic aortic valve disorders: Secondary | ICD-10-CM | POA: Diagnosis not present

## 2018-01-05 ENCOUNTER — Ambulatory Visit (INDEPENDENT_AMBULATORY_CARE_PROVIDER_SITE_OTHER): Payer: 59

## 2018-01-05 ENCOUNTER — Telehealth: Payer: Self-pay | Admitting: *Deleted

## 2018-01-05 DIAGNOSIS — Z23 Encounter for immunization: Secondary | ICD-10-CM

## 2018-01-05 NOTE — Telephone Encounter (Signed)
Patient called requesting urine results from 12/22/17 visit. I called pt back and received her voicemail and left on voicemail no urine was done at that visit.

## 2018-01-05 NOTE — Progress Notes (Signed)
   Patient in to nurse clinic for tetanus vaccination. Discussed TdaP and Td. Patient prefers to get Td. Vaccine given left deltoid. [facility] updated and copy given to patient.  Danley Danker, RN [facility] RN)

## 2018-01-18 DIAGNOSIS — E119 Type 2 diabetes mellitus without complications: Secondary | ICD-10-CM | POA: Diagnosis not present

## 2018-01-18 DIAGNOSIS — Z452 Encounter for adjustment and management of vascular access device: Secondary | ICD-10-CM | POA: Diagnosis not present

## 2018-01-18 DIAGNOSIS — I10 Essential (primary) hypertension: Secondary | ICD-10-CM | POA: Diagnosis not present

## 2018-01-18 DIAGNOSIS — Z853 Personal history of malignant neoplasm of breast: Secondary | ICD-10-CM | POA: Diagnosis not present

## 2018-02-06 IMAGING — CT CT HEAD W/O CM
3 of 4 series · 14 of 47 positions shown, 16 images · non-contrast
Comparison: None.

CLINICAL DATA: 55-year-old female with  headache.

EXAM:
CT HEAD WITHOUT CONTRAST
TECHNIQUE: Contiguous axial images were obtained from the base of the skull
through the vertex without intravenous contrast.

[Series 2: head w/o · axial · non-contrast · 0.47mm/px · z∈[-214,-88]mm · 8 of 31 slices shown, 10 images]
[im 3/31  brain]
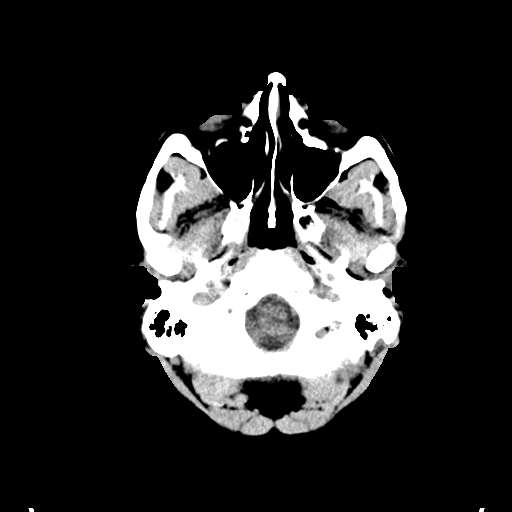
[im 3/31  bone]
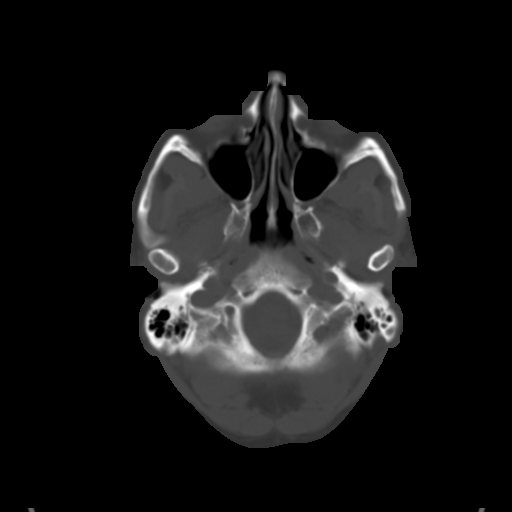
[im 7/31  brain]
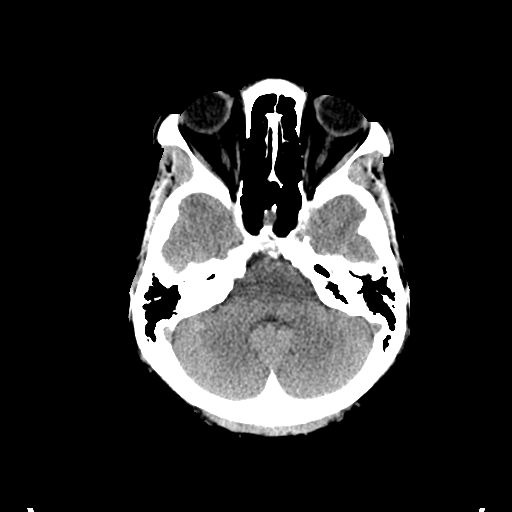
[im 11/31  brain]
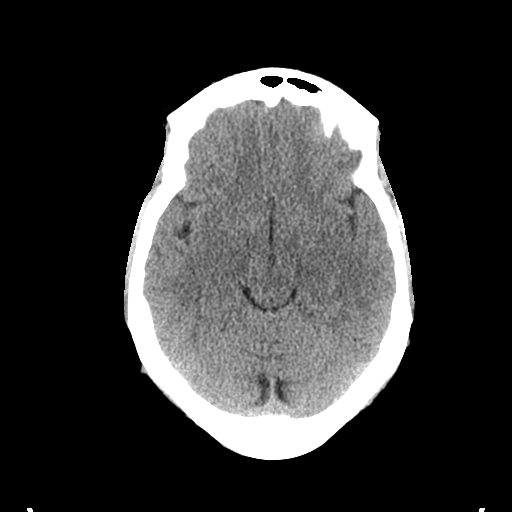
[im 13/31  brain]
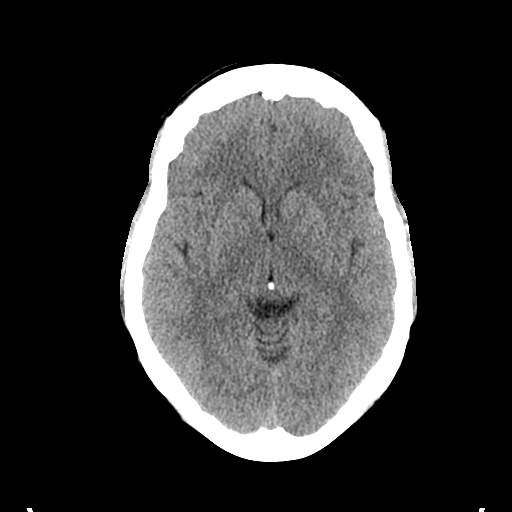
[im 18/31  brain]
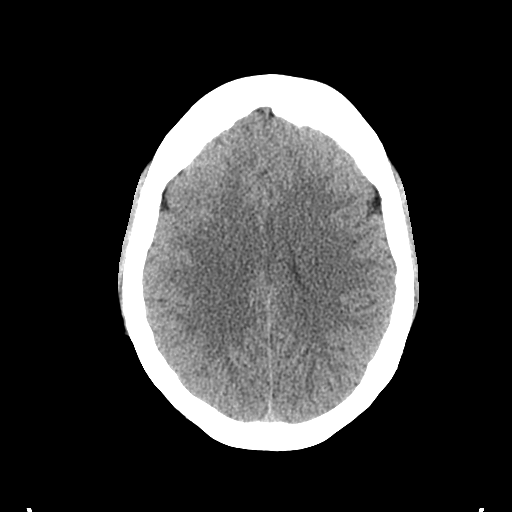
[im 18/31  bone]
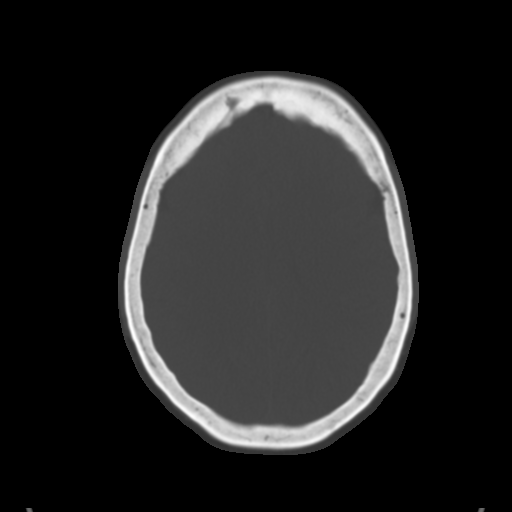
[im 20/31  brain]
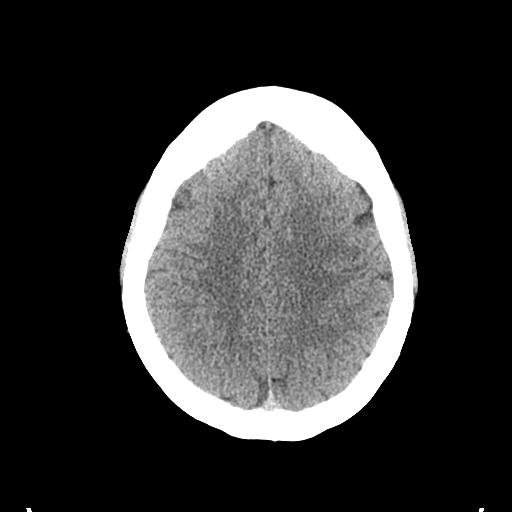
[im 24/31  brain]
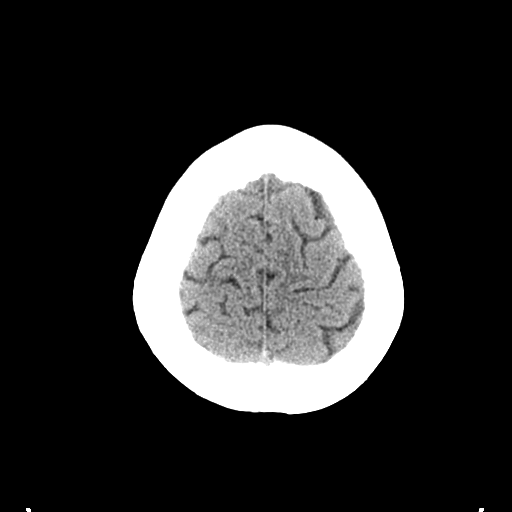
[im 28/31  brain]
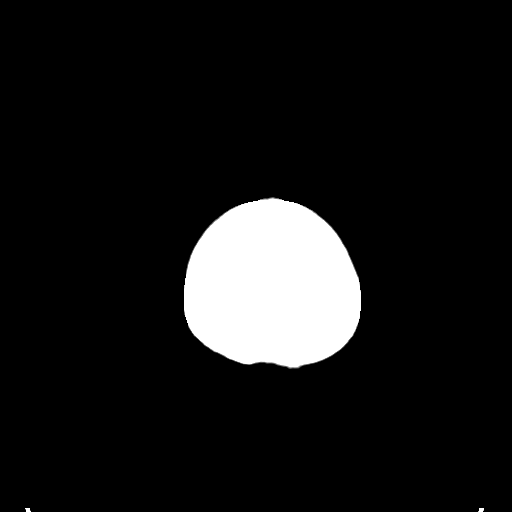

[Series 4: coronal · coronal · 0.29mm/px · 3 of 77 slices shown]
[im 26/77  brain]
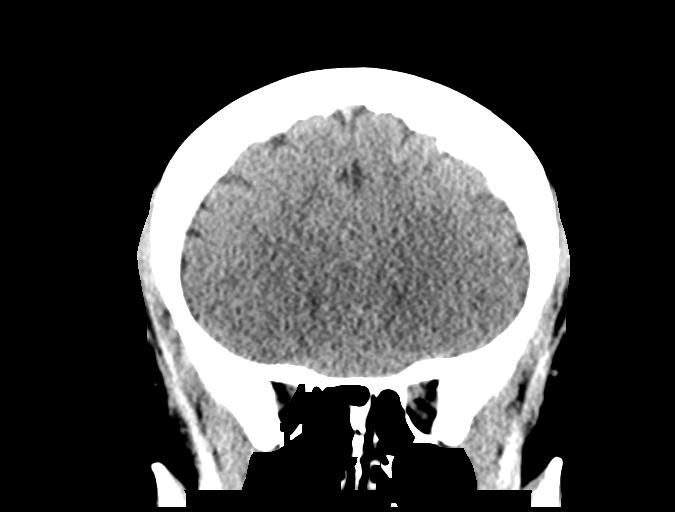
[im 34/77  brain]
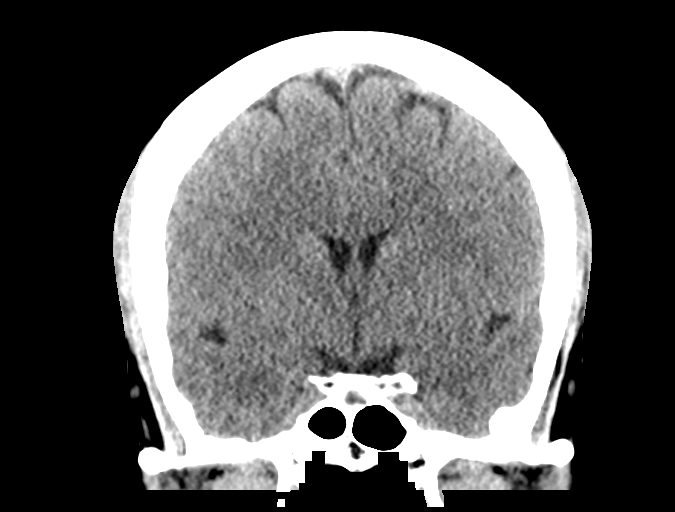
[im 43/77  brain]
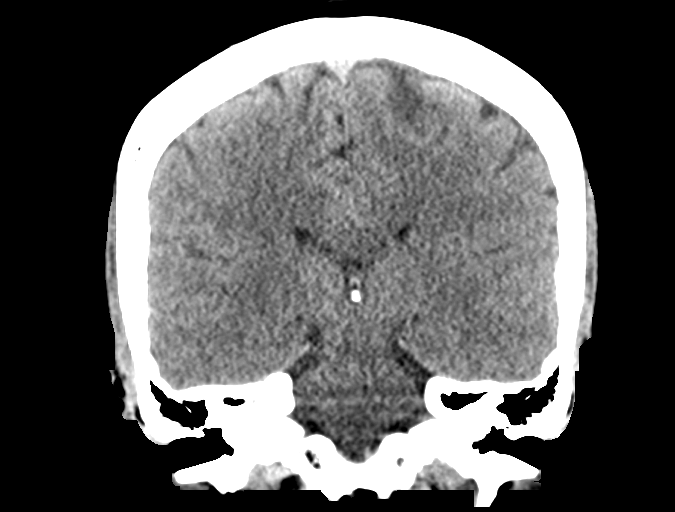

[Series 5: sagittal · sagittal · 0.29mm/px · 3 of 77 slices shown]
[im 26/77  brain]
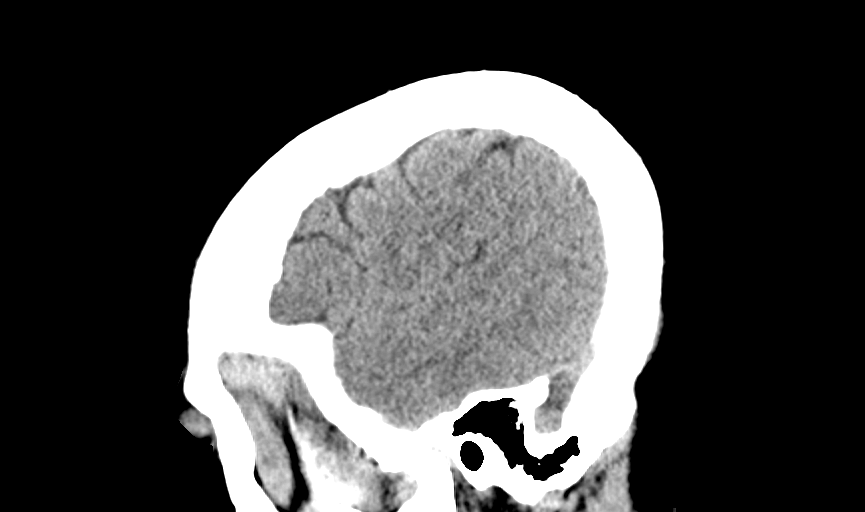
[im 39/77  brain]
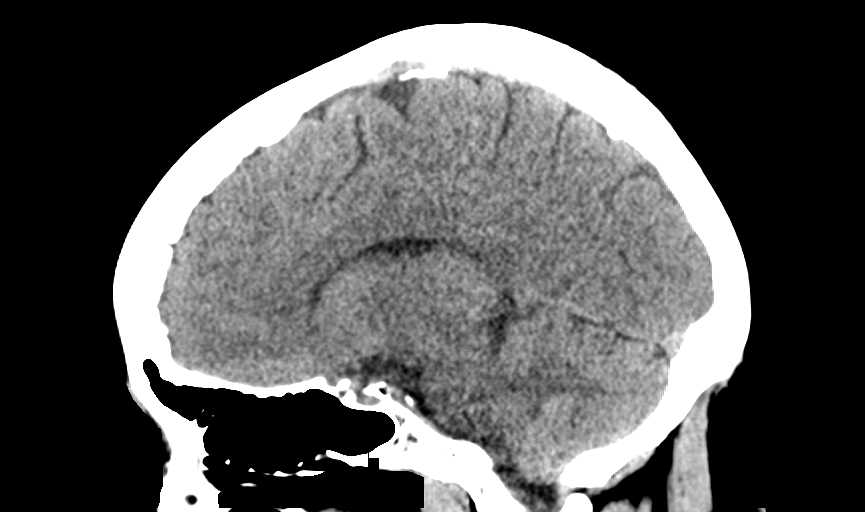
[im 51/77  brain]
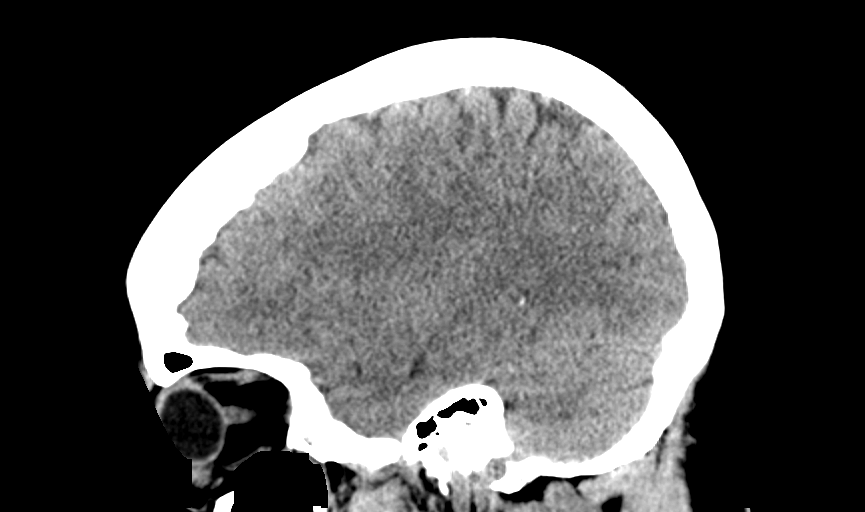

[14 of 47 positions shown; findings below may reference images not displayed]

FINDINGS: Brain: No evidence of acute infarction, hemorrhage, hydrocephalus,
extra-axial collection or mass lesion/mass effect.

Vascular: No hyperdense vessel or unexpected calcification.

Skull: Normal. Negative for fracture or focal lesion.

Sinuses/Orbits: No acute finding.

Other: None.
IMPRESSION: No acute intracranial pathology.

## 2018-02-27 DIAGNOSIS — T502X5A Adverse effect of carbonic-anhydrase inhibitors, benzothiadiazides and other diuretics, initial encounter: Secondary | ICD-10-CM | POA: Diagnosis not present

## 2018-02-27 DIAGNOSIS — T50995A Adverse effect of other drugs, medicaments and biological substances, initial encounter: Secondary | ICD-10-CM | POA: Diagnosis not present

## 2018-03-08 ENCOUNTER — Other Ambulatory Visit: Payer: Self-pay | Admitting: Family Medicine

## 2018-03-08 NOTE — Telephone Encounter (Signed)
Will refill lisinopril, but patient overdue for general HTN appt with me. Please call and have patient seen as soon as they can for HTN follow up.

## 2018-03-27 ENCOUNTER — Other Ambulatory Visit: Payer: 59

## 2018-03-27 ENCOUNTER — Ambulatory Visit: Payer: 59 | Admitting: Obstetrics & Gynecology

## 2018-05-02 DIAGNOSIS — C50211 Malignant neoplasm of upper-inner quadrant of right female breast: Secondary | ICD-10-CM | POA: Diagnosis not present

## 2018-05-02 DIAGNOSIS — I890 Lymphedema, not elsewhere classified: Secondary | ICD-10-CM | POA: Diagnosis not present

## 2018-05-02 DIAGNOSIS — I972 Postmastectomy lymphedema syndrome: Secondary | ICD-10-CM | POA: Diagnosis not present

## 2018-05-02 DIAGNOSIS — Z853 Personal history of malignant neoplasm of breast: Secondary | ICD-10-CM | POA: Diagnosis not present

## 2018-05-02 DIAGNOSIS — Z08 Encounter for follow-up examination after completed treatment for malignant neoplasm: Secondary | ICD-10-CM | POA: Diagnosis not present

## 2018-06-21 DIAGNOSIS — I5032 Chronic diastolic (congestive) heart failure: Secondary | ICD-10-CM | POA: Diagnosis not present

## 2018-06-22 DIAGNOSIS — I5033 Acute on chronic diastolic (congestive) heart failure: Secondary | ICD-10-CM | POA: Diagnosis not present

## 2018-08-20 ENCOUNTER — Other Ambulatory Visit: Payer: Self-pay | Admitting: Family Medicine

## 2018-08-20 NOTE — Telephone Encounter (Signed)
Will refill lisinopril for 30 days, but patient overdue for appt with me. Please call and have patient seen as soon as they can for blood pressure follow up.

## 2018-08-21 NOTE — Telephone Encounter (Signed)
Pt informed of below and appointment scheduled. Zimmerman Rumple, Moet Mikulski D, CMA  

## 2018-09-11 ENCOUNTER — Ambulatory Visit: Payer: 59 | Admitting: Family Medicine

## 2018-10-15 ENCOUNTER — Other Ambulatory Visit: Payer: Self-pay | Admitting: *Deleted

## 2018-10-15 DIAGNOSIS — IMO0002 Reserved for concepts with insufficient information to code with codable children: Secondary | ICD-10-CM

## 2018-10-15 DIAGNOSIS — E118 Type 2 diabetes mellitus with unspecified complications: Secondary | ICD-10-CM

## 2018-10-15 DIAGNOSIS — IMO0001 Reserved for inherently not codable concepts without codable children: Secondary | ICD-10-CM

## 2018-10-15 DIAGNOSIS — E1165 Type 2 diabetes mellitus with hyperglycemia: Secondary | ICD-10-CM

## 2018-10-16 MED ORDER — METFORMIN HCL 1000 MG PO TABS: 1000.0000 mg | ORAL_TABLET | Freq: Two times a day (BID) | ORAL | 3 refills | Status: DC

## 2018-11-27 ENCOUNTER — Other Ambulatory Visit: Payer: Self-pay

## 2018-11-27 ENCOUNTER — Telehealth (INDEPENDENT_AMBULATORY_CARE_PROVIDER_SITE_OTHER): Payer: 59 | Admitting: Family Medicine

## 2018-11-27 DIAGNOSIS — I10 Essential (primary) hypertension: Secondary | ICD-10-CM

## 2018-11-27 DIAGNOSIS — N3000 Acute cystitis without hematuria: Secondary | ICD-10-CM

## 2018-11-27 MED ORDER — LISINOPRIL 40 MG PO TABS: 40.0000 mg | ORAL_TABLET | Freq: Every day | ORAL | 0 refills | Status: AC

## 2018-11-27 MED ORDER — CEPHALEXIN 500 MG PO CAPS: 500.0000 mg | ORAL_CAPSULE | Freq: Three times a day (TID) | ORAL | 0 refills | Status: AC

## 2018-11-27 NOTE — Progress Notes (Signed)
[place] Telemedicine Visit  Patient consented to have virtual visit. Method of visit: Telephone  Encounter participants: Patient: QUINTANA CANELO - located at home Provider: Daisy Floro - located at clinic Others (if applicable): Leonia Corona  Chief Complaint: chills and dysuria  HPI: Since 1 week the patient has been having chills, discomfort and pain with urination at her urethra. Not a severe pain pain but is annoying to her. She believes it to be a UTI. The patient describes the pain with urination at her urethra, also some achiness in the lower abdomen. No vaginal discharge, no sexual intercourse, and does not suspect an STI. Denies any back pains or other referred pains. No itching or burning.   She has been taking tylenol for the chills and discomfort, helping some. She also says she is not sweaty cold, just cold. She suspects she has a fever because of the chills but has not taken her temperature.  ROS: per HPI Review of Systems  Constitutional: Positive for chills. Negative for fever.  Respiratory: Negative for cough and shortness of breath.   Gastrointestinal: Positive for abdominal pain. Negative for constipation, diarrhea, nausea and vomiting.  Genitourinary: Positive for dysuria. Negative for flank pain, frequency and urgency.  Musculoskeletal: Negative for myalgias.   Pertinent PMHx: HTN, T2DM  Exam:  Respiratory: no increased work of breathing, able to speak in full sentences  Assessment/Plan: Suspicious of a UTI, cannot perform UA or culture as patient is at home. - Will start patient on Keflex 500mg  TID - Strict return precautions given  Time spent during visit with patient: 13:42 minutes  Milus Banister, [place], PGY-1 11/27/2018 1:49 PM

## 2018-12-12 ENCOUNTER — Telehealth: Payer: Self-pay | Admitting: *Deleted

## 2018-12-12 NOTE — Telephone Encounter (Signed)
Patient states that she was given Abx at last telemedicine visit.  Her symptoms subsided while taking the medication but once she stopped the symptoms came back.  Will forward to MD.  Christen Bame, CMA

## 2018-12-13 NOTE — Telephone Encounter (Signed)
Patient called back to nurse line. I informed her of below and offered to schedule. Pt stated she will have to call back when she knows she will be available.

## 2018-12-13 NOTE — Telephone Encounter (Signed)
I see that Dr. Ouida Sills saw her on 5/19 for a possible UTI and I'm guessing the patient is still having urinary symptoms? Please encourage her that we would need an in person appt with urine sample to figure out what else could be going on and offer her one for tomorrow if available.

## 2018-12-13 NOTE — Telephone Encounter (Signed)
LVM to call office to inform her of below and assist her in getting an appointment schedule in person for tomorrow if she calls back in time. Kathleene Bergemann Zimmerman Rumple, CMA

## 2018-12-17 ENCOUNTER — Ambulatory Visit: Payer: 59 | Admitting: Family Medicine

## 2018-12-17 ENCOUNTER — Ambulatory Visit (INDEPENDENT_AMBULATORY_CARE_PROVIDER_SITE_OTHER): Payer: 59 | Admitting: Student in an Organized Health Care Education/Training Program

## 2018-12-17 ENCOUNTER — Other Ambulatory Visit: Payer: Self-pay

## 2018-12-17 ENCOUNTER — Encounter: Payer: Self-pay | Admitting: Student in an Organized Health Care Education/Training Program

## 2018-12-17 VITALS — BP 160/78 | HR 75 | Ht 64.0 in | Wt 226.5 lb

## 2018-12-17 DIAGNOSIS — R399 Unspecified symptoms and signs involving the genitourinary system: Secondary | ICD-10-CM

## 2018-12-17 DIAGNOSIS — R350 Frequency of micturition: Secondary | ICD-10-CM | POA: Diagnosis not present

## 2018-12-17 LAB — POCT URINALYSIS DIP (MANUAL ENTRY)
Bilirubin, UA: NEGATIVE
Glucose, UA: NEGATIVE mg/dL
Ketones, POC UA: NEGATIVE mg/dL
Nitrite, UA: NEGATIVE
Protein Ur, POC: 30 mg/dL — AB
Spec Grav, UA: 1.015 (ref 1.010–1.025)
Urobilinogen, UA: 1 E.U./dL
pH, UA: 6 (ref 5.0–8.0)

## 2018-12-17 LAB — POCT UA - MICROSCOPIC ONLY

## 2018-12-17 NOTE — Assessment & Plan Note (Addendum)
Completed course of keflex.  Remains frequent urination. Patient endorses poor PO fluid intake. UA negative for nitrites. Positive for leukocytes, RBC 30 proteins, and trace bacteria.  This is similar to past several UAs over years.  I recommended patient increase her fluid intake and let us know if her symptoms do not improve or worsen Last A1c >1 year ago was 6.5. poor glucose control could contribute to frequent UTI reoccurrences. Would consider rechecking at next visit

## 2018-12-17 NOTE — Progress Notes (Signed)
urin

## 2018-12-17 NOTE — Progress Notes (Signed)
   Subjective:    Patient ID: Kaitlyn Cervantes, female    DOB: Dec 22, 1960, 58 y.o.   MRN: [id number]   CC: continued UTI symptoms  HPI: Patient had telemedicine visit 5/19 2/2 urinary symptoms and received keflex. She completed the course which improved her symptoms. She denies any dysuria, discharge, pruritus, foul odor, or abnormal appearance of her urine. She describes having frequency/urgency. She does not drink much fluids during the day. She has not been sexually active since her husband passed a couple years ago. Denies any fevers, flank pain, suprapubic pain, N/V. She has a history of kidney stones but states that this does not feel similar to that. This feels like a mild version of her past UTIs.  Smoking status reviewed   ROS: pertinent noted in the HPI   Past Medical History:  Diagnosis Date  . Anemia 2009  . Blood transfusion without reported diagnosis 2009  . Breast cancer of upper-inner quadrant of right female breast ([place]) 10/19/2015   BRCA I & II negative  . Diabetes mellitus without complication ([place])   . Gallstones    has not had removed  . History of stomach ulcers    treated   . Hypertension   . Kidney stones   . Sleep apnea 2005    Past Surgical History:  Procedure Laterality Date  . CESAREAN SECTION     x3  . kidney stones  2007  . right mastectomy     07/2016  . SPLENECTOMY     age 44     Past medical history, surgical, family, and social history reviewed and updated in the EMR as appropriate.  Objective:  BP (!) 160/78   Pulse 75   Ht '5\' 4"'$  (1.626 m)   Wt 226 lb 8 oz (102.7 kg)   SpO2 99%   BMI 38.88 kg/m   Vitals and nursing note reviewed  General: NAD, pleasant, able to participate in exam Renal: CVA tenderness negative bilaterally. Patient has mild RLQ discomfort with deep palpation. Negative for suprapubic tenderness.  Extremities: patient has mild non-pitting edema to LE bilaterally up to mid shin (patient states this is chronic) no  cyanosis. Skin: warm and dry, no rashes noted Neuro: alert, no obvious focal deficits Psych: Normal affect and mood  Assessment & Plan:   Urine frequency Completed course of keflex.  Remains frequent urination. Patient endorses poor PO fluid intake. UA negative for nitrites. Positive for leukocytes, RBC 30 proteins, and trace bacteria.  This is similar to past several UAs over years.  I recommended patient increase her fluid intake and let us know if her symptoms do not improve or worsen Last A1c >1 year ago was 6.5. poor glucose control could contribute to frequent UTI reoccurrences. Would consider rechecking at next visit   Doristine Mango, [facility] PGY-1

## 2018-12-17 NOTE — Patient Instructions (Signed)
It was a pleasure to see you today!  To summarize our discussion for this visit:  Your urinalysis did not show signs of infection  I recommend you drink plenty of water at this time  Please let us know if you feel worse or if you develop any new symptoms such as fever  I will contact you with the results of the reflex urine test  Some additional health maintenance measures we should update are: Health Maintenance Due  Topic Date Due  . OPHTHALMOLOGY EXAM  03/24/1970  . MAMMOGRAM  10/04/2017  . HEMOGLOBIN A1C  11/27/2017  . FOOT EXAM  07/25/2018  .  Marland Kitchen   Call the clinic at [phone] if your symptoms worsen or you have any concerns.  Thank you for allowing me to take part in your care,  Dr. Doristine Mango   Thanks for choosing [facility] for your primary care.

## 2018-12-21 ENCOUNTER — Telehealth: Payer: Self-pay | Admitting: Student in an Organized Health Care Education/Training Program

## 2018-12-21 DIAGNOSIS — R399 Unspecified symptoms and signs involving the genitourinary system: Secondary | ICD-10-CM

## 2018-12-21 NOTE — Telephone Encounter (Signed)
Called patient to inform of urinalysis microscopic results.  Requested that patient return at earliest convenience next week for repeat urinalysis to assess for clearance of hematuria and to complete a urine culture. (patient has history of Proteus with resistance to keflex which was recent treatment for UTI).  Currently, patient believes that the uncomfortable sensation has improved since the last time I saw her. Patient prefers to come in for lab visit without visit with physician for schedule reasons. Tuesday am. I discussed with patient the possibility of repeat hematuria with this test could result in a referral to urology for further examination.

## 2018-12-25 ENCOUNTER — Encounter: Payer: 59 | Admitting: Obstetrics & Gynecology

## 2018-12-25 ENCOUNTER — Other Ambulatory Visit: Payer: 59

## 2018-12-28 ENCOUNTER — Encounter: Payer: 59 | Admitting: Obstetrics & Gynecology

## 2019-01-04 ENCOUNTER — Encounter: Payer: 59 | Admitting: Obstetrics & Gynecology

## 2019-01-14 ENCOUNTER — Other Ambulatory Visit: Payer: 59

## 2019-02-01 ENCOUNTER — Ambulatory Visit (INDEPENDENT_AMBULATORY_CARE_PROVIDER_SITE_OTHER): Payer: 59 | Admitting: Family Medicine

## 2019-02-01 ENCOUNTER — Encounter: Payer: Self-pay | Admitting: Family Medicine

## 2019-02-01 ENCOUNTER — Other Ambulatory Visit: Payer: Self-pay

## 2019-02-01 VITALS — BP 155/75 | HR 74 | Temp 98.5°F | Wt 229.0 lb

## 2019-02-01 DIAGNOSIS — S30826A Blister (nonthermal) of unspecified external genital organs, female, initial encounter: Secondary | ICD-10-CM

## 2019-02-01 DIAGNOSIS — N739 Female pelvic inflammatory disease, unspecified: Secondary | ICD-10-CM | POA: Diagnosis not present

## 2019-02-01 DIAGNOSIS — R399 Unspecified symptoms and signs involving the genitourinary system: Secondary | ICD-10-CM | POA: Diagnosis not present

## 2019-02-01 DIAGNOSIS — R350 Frequency of micturition: Secondary | ICD-10-CM

## 2019-02-01 DIAGNOSIS — I10 Essential (primary) hypertension: Secondary | ICD-10-CM

## 2019-02-01 DIAGNOSIS — E119 Type 2 diabetes mellitus without complications: Secondary | ICD-10-CM

## 2019-02-01 LAB — POCT GLYCOSYLATED HEMOGLOBIN (HGB A1C): HbA1c, POC (controlled diabetic range): 10 % — AB (ref 0.0–7.0)

## 2019-02-01 NOTE — Patient Instructions (Addendum)
It was a pleasure meeting you.  1. We took some tests and will get back to you with the results of the cause of the blister.  2. Your Hemoglobin A1c was 10 today.  3. We recommend that you see your ophthalmologist once a year.  4. We also obtained a urine culture today and will call with results and instructions if any treatment is necessary.  5. I recommend a follow up visit to discuss high blood pressure and diabetes therapy with your PCP in the near future. Feel better!  Dr. Octaviano Batty

## 2019-02-01 NOTE — Assessment & Plan Note (Signed)
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  - Pt declined STI testing including GC, HIV, RPR - Pt declined GU exam, could not visualize vulva or labia. - Pt very upset at possibility of HSV. She reported that either this is a skin infection from a public toilet or a parasite from her dog.

## 2019-02-01 NOTE — Assessment & Plan Note (Signed)
-   Hgb A1c 10 today - Pt's A1c historically 6-7 - Pt reports no problem obtaining or using her medication - Follow up to optimize diabetic regimen - Foot exam conducted today WNL - Recommended pt see her ophthalmologist for routine DM screening

## 2019-02-01 NOTE — Progress Notes (Signed)
Subjective:    Patient ID: Kaitlyn Cervantes, female    DOB: 1960-10-03, 58 y.o.   MRN: [id number]   CC: blisters on vagina  HPI: Kaitlyn Cervantes is a 58 yo woman w/ PMH of DM, HTN, CHF, HLD, and UTI presenting with new lesion in her genitals. Pt reports that it is a cluster of blisters that started this week. Last week she noticed some itching and burning in the skin of that area before blisters appeared. She has had folliculitis before and at first attributed to that, but says that the blisters do not look like her previous experiences of folliculitis.   Pt is not currently sexually active. She had 1 lifetime partner for 37 years, her husband, who passed away. She has not had any STIs in the past. Pap smear 1 year ago NILM and negative for HPV.   Pt is still experiencing some dysuria since her last visit. She has frequency and some burning with urination. No fevers, chills, CP, SOB, n/v/d, HA, cough or cold sx.  Health Maintenance: pt due for Hgb A1c, diabetic foot exam, diabetic eye exam. Since she has a h/o BrCa she is followed at [facility] and gets a mammogram q6 mo. Last exam June 2020.  Smoking status reviewed   ROS: pertinent noted in the HPI   Past medical history, surgical, family, and social history reviewed and updated in the EMR as appropriate.  Objective:  BP (!) 155/75   Pulse 74   Temp 98.5 F (36.9 C) (Oral)   Wt 229 lb (103.9 kg)   SpO2 95%   BMI 39.31 kg/m   Vitals and nursing note reviewed  General: NAD, able to participate in exam Extremities: no edema or cyanosis. Skin: warm and dry. 2 cm area of vesicles with non-erythematous base, clear-brown fluid located at R mons pubis. GU: patient declined Neuro: alert, no obvious focal deficits Psych: Normal affect and mood   Diabetic Foot Exam - Simple   Simple Foot Form Visual Inspection No deformities, no ulcerations, no other skin breakdown bilaterally: Yes Sensation Testing Intact to touch and monofilament  testing bilaterally: Yes Pulse Check Posterior Tibialis and Dorsalis pulse intact bilaterally: Yes Comments Foot exam WNL. Dry skin noted, suggested to patient to use lotion to protect epidermal integrity.      Assessment & Plan:   Kaitlyn Cervantes is a 58 yo woman with PMH of DM, HTN, CHF, HLD, and UTI presenting today for new vesicular lesion of external female genitalia.  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  - Pt declined STI testing including GC, HIV, RPR - Pt declined GU exam, could not visualize vulva or labia. - Pt very upset at possibility of HSV. She reported that either this is a skin infection from a public toilet or a parasite from her dog.  Urine frequency - Pt continues to have dysuria  - Urine culture collected in clinic today - H/O ucx in 2017 w/ proteus mirabilis resistant to cephalosporins, nitrofurantoin. Sensitive to bactrim.  Diabetes ([place]) - Hgb A1c 10 today - Pt's A1c historically 6-7 - Pt reports no problem obtaining or using her medication - Follow up to optimize diabetic regimen - Foot exam conducted today WNL - Recommended pt see her ophthalmologist for routine DM screening  Essential hypertension, benign Uncontrolled BP 155/75 today - Pt's BP historically 140s in office - She reports no problem taking her medications, all recently refilled - Possible anxious result due to  discussion of HSV - F/U to discuss optimization of BP medication  Gladys Damme, MD [place] PGY-1

## 2019-02-01 NOTE — Assessment & Plan Note (Signed)
Uncontrolled BP 155/75 today - Pt's BP historically 140s in office - She reports no problem taking her medications, all recently refilled - Possible anxious result due to discussion of HSV - F/U to discuss optimization of BP medication

## 2019-02-01 NOTE — Assessment & Plan Note (Signed)
-   Pt continues to have dysuria  - Urine culture collected in clinic today - H/O ucx in 2017 w/ proteus mirabilis resistant to cephalosporins, nitrofurantoin. Sensitive to bactrim.

## 2019-02-03 LAB — URINE CULTURE

## 2019-02-04 ENCOUNTER — Telehealth: Payer: Self-pay | Admitting: *Deleted

## 2019-02-04 DIAGNOSIS — R399 Unspecified symptoms and signs involving the genitourinary system: Secondary | ICD-10-CM

## 2019-02-04 MED ORDER — SULFAMETHOXAZOLE-TRIMETHOPRIM 800-160 MG PO TABS: 1.0000 | ORAL_TABLET | Freq: Two times a day (BID) | ORAL | 0 refills | Status: DC

## 2019-02-04 NOTE — Telephone Encounter (Signed)
Spoke with pt about UTI- proteus mirabilis, sensitive to bactrim, resistant to nitrofurantoin, previously resistant to cephalosporins, she tried keflex last  month. Will treat with Bactrim 800/160 mg BID x 7 d. Discussed possible SE with pt, esp GI upset. Pt does not have sulfa allergy.

## 2019-02-04 NOTE — Telephone Encounter (Signed)
Pt returns call, she is at work and has been unable to answer call.   Pt is available until 2:15, around 4:20 and is then off work @ 6.  She states that she does not know how to use mychart.    Will forward to Dr. Chauncey Reading. Christen Bame, CMA

## 2019-02-04 NOTE — Telephone Encounter (Signed)
Pt returning call to Dr. Chauncey Reading.  Christen Bame, CMA

## 2019-02-05 NOTE — Telephone Encounter (Signed)
Received fax from pharmacy asking to clarify quantity for Bactrim. #1 tab was sent in by mistake. Called in #14 to pharmacy, per MD instructions.

## 2019-02-06 LAB — HERPES SIMPLEX VIRUS CULTURE

## 2019-02-07 ENCOUNTER — Telehealth: Payer: Self-pay | Admitting: *Deleted

## 2019-02-07 NOTE — Telephone Encounter (Signed)
Pt calling for results of herpes test. Christen Bame, CMA

## 2019-02-08 MED ORDER — MUPIROCIN 2 % EX OINT: 1.0000 "application " | TOPICAL_OINTMENT | Freq: Two times a day (BID) | CUTANEOUS | 0 refills | Status: DC

## 2019-02-08 NOTE — Telephone Encounter (Signed)
Discussed disappointing outcome of lab test with patient. [facility] did not refrigerate sample during transportation and it cannot be used. I discussed with patient options since the blister has popped and is in the process of resolving. She would like treatment for the open sore that has resulted. Bactroban 2%, 20 g tube, use BID on affected area x 1 week sent to pharmacy. Pt wants to know what tests can be done now that blister is resolving. Discussed with her that we cannot culture the area since it is now an excoriation. If a new blister appears, instructed her to make an appt so we can culture the fluid from that. In the meantime, she is amenable to a lab appointment for serum IgG/IgM for HSV1,2. Discussed with Schuyler Amor that test will be paid for by [facility] as it was their mistake that the original sample could not be used. Future lab orders in place. Pt will call the day before she comes for serum sample draw.

## 2019-02-08 NOTE — Addendum Note (Signed)
Addended by: Bridget Hartshorn on: 02/08/2019 10:40 AM   Modules accepted: Orders

## 2019-02-08 NOTE — Addendum Note (Signed)
Addended by: Zenia Resides on: 02/08/2019 08:43 AM   Modules accepted: Orders

## 2019-02-15 ENCOUNTER — Telehealth: Payer: Self-pay | Admitting: *Deleted

## 2019-02-15 NOTE — Telephone Encounter (Signed)
Pt calls because the ointment she was given was instructed to go in to nose.  She does not think this is right.  Will forward to Dr. Chauncey Reading to advise.  Christen Bame, CMA

## 2019-02-15 NOTE — Telephone Encounter (Signed)
Called pt to confirm that she is correct. Bactroban in her case is to be used on the "affected area," the resolving blister on her mons pubis.

## 2019-04-09 ENCOUNTER — Encounter: Payer: Self-pay | Admitting: Gynecology

## 2019-05-01 ENCOUNTER — Telehealth: Payer: Self-pay

## 2019-05-01 ENCOUNTER — Ambulatory Visit: Payer: 59 | Admitting: Family Medicine

## 2019-05-01 NOTE — Telephone Encounter (Signed)
Patient was on Dr. Shelby Mattocks schedule this morning at 8:20 for a NP to establish care as a doxy visit.  Left detailed vm for patient advising that Dr. Rogers Blocker does not do doxy visits for new patients.  Advised to c/b to r/s in office visit to establish care if she chooses.  I apologized for the inconvenience.  Appointment was scheduled by Yvette Rack through the [facility]

## 2019-05-02 ENCOUNTER — Telehealth: Payer: Self-pay

## 2019-05-02 NOTE — Telephone Encounter (Signed)
Left vm message for patient requesting call back.

## 2019-05-02 NOTE — Telephone Encounter (Signed)
Copied from [place] [phone]. Topic: General - Other >> May 02, 2019  9:19 AM Keene Breath wrote: Reason for CRM: Patient would like for the nurse to call her because she has some questions before her appt.  CB# [phone]

## 2019-05-03 NOTE — Telephone Encounter (Signed)
Spoke to patient and advised of reasons why her establish care visit has to be an in office visit.  Patient was upset that she waited so long to get appointment with Dr. Rogers Blocker and then had to be r/s.  I spent 10 mins answering her questions and reviewing what all a NP establish care visit entails.  Patient verbalized understanding and was satisfied at the end of the call.

## 2019-05-22 ENCOUNTER — Other Ambulatory Visit: Payer: Self-pay

## 2019-05-22 ENCOUNTER — Ambulatory Visit (INDEPENDENT_AMBULATORY_CARE_PROVIDER_SITE_OTHER): Payer: 59 | Admitting: Family Medicine

## 2019-05-22 VITALS — BP 142/70 | HR 70 | Wt 223.0 lb

## 2019-05-22 DIAGNOSIS — R300 Dysuria: Secondary | ICD-10-CM | POA: Diagnosis not present

## 2019-05-22 DIAGNOSIS — N3000 Acute cystitis without hematuria: Secondary | ICD-10-CM | POA: Diagnosis not present

## 2019-05-22 LAB — POCT URINALYSIS DIP (MANUAL ENTRY)
Bilirubin, UA: NEGATIVE
Glucose, UA: 500 mg/dL — AB
Ketones, POC UA: NEGATIVE mg/dL
Nitrite, UA: NEGATIVE
Protein Ur, POC: 30 mg/dL — AB
Spec Grav, UA: 1.01 (ref 1.010–1.025)
Urobilinogen, UA: 0.2 E.U./dL
pH, UA: 6 (ref 5.0–8.0)

## 2019-05-22 MED ORDER — CIPROFLOXACIN HCL 250 MG PO TABS: 250.0000 mg | ORAL_TABLET | Freq: Two times a day (BID) | ORAL | 0 refills | Status: DC

## 2019-05-22 NOTE — Progress Notes (Signed)
   Subjective:    Patient ID: Kaitlyn Cervantes, female    DOB: January 04, 1961, 58 y.o.   MRN: [id number]   CC: concern for UTI  HPI: UTI Patient presenting for concern of UTI.  States that she has had UTIs in the past and this feels exactly like this.  Is reporting urinary frequency, urgency.  Does report some dysuria as well.  Reports that her urine is not discolored, is cloudy.  Denies any odor change.  Denies any hematuria.  States that it is irritating down there.  Denies fever or new back pain.  In the past she has tried Keflex which has not helped as well as Bactrim which has not helped. Recent urine cx From July 2020 showing Proteus that was resistant to nitrofurantoin as well as tetracycline.  Objective:  BP (!) 142/70   Pulse 70   Wt 223 lb (101.2 kg)   SpO2 98%   BMI 38.28 kg/m  Vitals and nursing note reviewed  General: well nourished, in no acute distress HEENT: normocephalic  Cardiac: RRR, clear S1 and S2, no murmurs, rubs, or gallops Respiratory: clear to auscultation bilaterally, no increased work of breathing Abdomen: soft, nontender, nondistended, no masses or organomegaly. Bowel sounds present Extremities: no edema or cyanosis. Warm, well perfused.  Skin: warm and dry, no rashes noted Neuro: alert and oriented, no focal deficits   Assessment & Plan:    Acute cystitis without hematuria Symptoms consistent with uncomplicated urinary tract infection.  No frank hematuria.  Given that patient has had previous Proteus resistant to nitrofurantoin we will get urine culture as well as UA.  UA showing positive leukocytes as well as large RBCs. Patient has been intolerant to Keflex as well as Bactrim in the past.  Last urine culture was resistant to nitrofurantoin.  Given that she is resistant to several antibiotics we will have to give ciprofloxacin.  Advised to use Cipro twice daily x3 days.  Advised on irreversible tendinopathy as well as tendon rupture.  Information given in  AVS.  If urine culture shows sensitivity to another antibiotic can advised to switch versus continuing using shared decision making.  Strict return precautions given.  Advised to follow-up in 2 weeks to ensure she is improving and to get repeat UA to ensure resolution of hematuria. If continued hematuria despite clearing of infection may need referral to urology for further w/u    Return in about 2 weeks (around 06/05/2019), or if symptoms worsen or fail to improve.   Caroline More, DO, PGY-3

## 2019-05-22 NOTE — Patient Instructions (Addendum)
Acute Urinary Retention, Female  Acute urinary retention means that you cannot pee (urinate) at all, or that you pee too little and your bladder is not emptied completely. If it is not treated, it can lead to kidney damage or other serious problems. Follow these instructions at home:  Take over-the-counter and prescription medicines only as told by your doctor. Ask your doctor what medicines you should stay away from. Do not take any medicine unless your doctor says it is okay to do so.  If you were sent home with a tube that drains pee from the bladder (catheter), take care of it as told by your doctor.  Drink enough fluid to keep your pee clear or pale yellow.  If you were given an antibiotic, take it as told by your doctor. Do not stop taking the antibiotic even if you start to feel better.  Do not use any products that contain nicotine or tobacco, such as cigarettes and e-cigarettes. If you need help quitting, ask your doctor.  Watch for changes in your symptoms. Tell your doctor about them.  If told, keep track of any changes in your blood pressure at home. Tell your doctor about them.  Keep all follow-up visits as told by your doctor. This is important. Contact a doctor if:  You have spasms or you leak pee when you have spasms. Get help right away if:  You have chills or a fever.  You have blood in your pee.  You have a tube that drains the bladder and: ? The tube stops draining pee. ? The tube falls out. Summary  Acute urinary retention means that you cannot pee at all, or that you pee too little and your bladder is not emptied completely. If it is not treated, it can result in kidney damage or other serious problems.  If you were sent home with a tube that drains pee from the bladder, take care of it as told by your doctor.  Pay attention to any changes in your symptoms. Tell your doctor about them. This information is not intended to replace advice given to you by your  health care provider. Make sure you discuss any questions you have with your health care provider. Document Released: 12/14/2007 Document Revised: 06/09/2017 Document Reviewed: 07/29/2016 Elsevier Patient Education  [place].   Ciprofloxacin tablets What is this medicine? CIPROFLOXACIN (sip roe FLOX a sin) is a quinolone antibiotic. It is used to treat certain kinds of bacterial infections. It will not work for colds, flu, or other viral infections. This medicine may be used for other purposes; ask your health care provider or pharmacist if you have questions. COMMON BRAND NAME(S): Cipro What should I tell my health care provider before I take this medicine? They need to know if you have any of these conditions:  bone problems  diabetes  heart disease  high blood pressure  history of irregular heartbeat  history of low levels of potassium in the blood  joint problems  kidney disease  liver disease  mental illness  myasthenia gravis  seizures  tendon problems  tingling of the fingers or toes, or other nerve disorder  an unusual or allergic reaction to ciprofloxacin, other antibiotics or medicines, foods, dyes, or preservatives  pregnant or trying to get pregnant  breast-feeding How should I use this medicine? Take this medicine by mouth with a full glass of water. Follow the directions on the prescription label. You can take it with or without food. If  it upsets your stomach, take it with food. Take your medicine at regular intervals. Do not take your medicine more often than directed. Take all of your medicine as directed even if you think you are better. Do not skip doses or stop your medicine early. Avoid antacids, aluminum, calcium, iron, magnesium, and zinc products for 6 hours before and 2 hours after taking a dose of this medicine. A special MedGuide will be given to you by the pharmacist with each prescription and refill. Be sure to read this  information carefully each time. Talk to your pediatrician regarding the use of this medicine in children. Special care may be needed. Overdosage: If you think you have taken too much of this medicine contact a poison control center or emergency room at once. NOTE: This medicine is only for you. Do not share this medicine with others. What if I miss a dose? If you miss a dose, take it as soon as you can. If it is almost time for your next dose, take only that dose. Do not take double or extra doses. What may interact with this medicine? Do not take this medicine with any of the following medications:  cisapride  dronedarone  flibanserin  lomitapide  pimozide  thioridazine  tizanidine This medicine may also interact with the following medications:  antacids  birth control pills  caffeine  certain medicines for diabetes, like glipizide, glyburide, or insulin  certain medicines that treat or prevent blood clots like warfarin  clozapine  cyclosporine  didanosine buffered tablets or powder  dofetilide  duloxetine  lanthanum carbonate  lidocaine  methotrexate  multivitamins  NSAIDS, medicines for pain and inflammation, like ibuprofen or naproxen  olanzapine  omeprazole  other medicines that prolong the QT interval (cause an abnormal heart rhythm)  phenytoin  probenecid  ropinirole  sevelamer  sildenafil  sucralfate  theophylline  ziprasidone  zolpidem This list may not describe all possible interactions. Give your health care provider a list of all the medicines, herbs, non-prescription drugs, or dietary supplements you use. Also tell them if you smoke, drink alcohol, or use illegal drugs. Some items may interact with your medicine. What should I watch for while using this medicine? Tell your doctor or health care provider if your symptoms do not start to get better or if they get worse. This medicine may cause serious skin reactions. They can  happen weeks to months after starting the medicine. Contact your health care provider right away if you notice fevers or flu-like symptoms with a rash. The rash may be red or purple and then turn into blisters or peeling of the skin. Or, you might notice a red rash with swelling of the face, lips or lymph nodes in your neck or under your arms. Do not treat diarrhea with over the counter products. Contact your doctor if you have diarrhea that lasts more than 2 days or if it is severe and watery. Check with your doctor or health care provider if you get an attack of severe diarrhea, nausea and vomiting, or if you sweat a lot. The loss of too much body fluid can make it dangerous for you to take this medicine. This medicine may increase blood sugar. Ask your health care provider if changes in diet or medicines are needed if you have diabetes. You may get drowsy or dizzy. Do not drive, use machinery, or do anything that needs mental alertness until you know how this medicine affects you. Do not sit or stand  up quickly, especially if you are an older patient. This reduces the risk of dizzy or fainting spells. This medicine can make you more sensitive to the sun. Keep out of the sun. If you cannot avoid being in the sun, wear protective clothing and use sunscreen. Do not use sun lamps or tanning beds/booths. What side effects may I notice from receiving this medicine? Side effects that you should report to your doctor or health care professional as soon as possible:  allergic reactions like skin rash or hives, swelling of the face, lips, or tongue  anxious  bloody or watery diarrhea  confusion  depressed mood  fast, irregular heartbeat  fever  hallucination, loss of contact with reality  joint, muscle, or tendon pain or swelling  loss of memory  pain, tingling, numbness in the hands or feet  redness, blistering, peeling or loosening of the skin, including inside the  mouth  seizures  signs and symptoms of aortic dissection such as sudden chest, stomach, or back pain  signs and symptoms of high blood sugar such as being more thirsty or hungry or having to urinate more than normal. You may also feel very tired or have blurry vision.  signs and symptoms of liver injury like dark yellow or brown urine; general ill feeling or flu-like symptoms; light-colored stools; loss of appetite; nausea; right upper belly pain; unusually weak or tired; yellowing of the eyes or skin  signs and symptoms of low blood sugar such as feeling anxious; confusion; dizziness; increased hunger; unusually weak or tired; sweating; shakiness; cold; irritable; headache; blurred vision; fast heartbeat; loss of consciousness; pale skin  suicidal thoughts or other mood changes  sunburn  unusually weak or tired  IRREVERSIBLE TENDINOPATHY AND TENDON RUPTURE  Side effects that usually do not require medical attention (report to your doctor or health care professional if they continue or are bothersome):  dry mouth  headache  nausea  trouble sleeping This list may not describe all possible side effects. Call your doctor for medical advice about side effects. You may report side effects to FDA at 1-800-FDA-1088. Where should I keep my medicine? Keep out of the reach of children. Store at room temperature below 30 degrees C (86 degrees F). Keep container tightly closed. Throw away any unused medicine after the expiration date. NOTE: This sheet is a summary. It may not cover all possible information. If you have questions about this medicine, talk to your doctor, pharmacist, or health care provider.  2020 Elsevier/Gold Standard (2018-09-27 11:26:08)  Past 2 urine cultures have shown sensitivity to Keflex.  This is historically a safer drug choice compared to Cipro. I recommend considering using keflex in the future if needed.

## 2019-05-22 NOTE — Assessment & Plan Note (Addendum)
Symptoms consistent with uncomplicated urinary tract infection.  No frank hematuria.  Given that patient has had previous Proteus resistant to nitrofurantoin we will get urine culture as well as UA.  UA showing positive leukocytes as well as large RBCs. Patient has been intolerant to Keflex as well as Bactrim in the past.  Last urine culture was resistant to nitrofurantoin.  Given that she is resistant to several antibiotics we will have to give ciprofloxacin.  Advised to use Cipro twice daily x3 days.  Advised on irreversible tendinopathy as well as tendon rupture.  Information given in AVS.  If urine culture shows sensitivity to another antibiotic can advised to switch versus continuing using shared decision making.  Strict return precautions given.  Advised to follow-up in 2 weeks to ensure she is improving and to get repeat UA to ensure resolution of hematuria. If continued hematuria despite clearing of infection may need referral to urology for further w/u

## 2019-05-25 LAB — URINE CULTURE

## 2019-05-27 ENCOUNTER — Encounter: Payer: Self-pay | Admitting: Family Medicine

## 2019-07-02 ENCOUNTER — Other Ambulatory Visit: Payer: Self-pay

## 2019-07-03 ENCOUNTER — Encounter: Payer: Self-pay | Admitting: Family Medicine

## 2019-07-03 ENCOUNTER — Ambulatory Visit (INDEPENDENT_AMBULATORY_CARE_PROVIDER_SITE_OTHER): Payer: 59 | Admitting: Family Medicine

## 2019-07-03 VITALS — BP 142/88 | HR 71 | Temp 98.3°F | Ht 64.0 in | Wt 220.6 lb

## 2019-07-03 DIAGNOSIS — Z0000 Encounter for general adult medical examination without abnormal findings: Secondary | ICD-10-CM | POA: Diagnosis not present

## 2019-07-03 DIAGNOSIS — E782 Mixed hyperlipidemia: Secondary | ICD-10-CM

## 2019-07-03 DIAGNOSIS — I10 Essential (primary) hypertension: Secondary | ICD-10-CM | POA: Diagnosis not present

## 2019-07-03 DIAGNOSIS — E1165 Type 2 diabetes mellitus with hyperglycemia: Secondary | ICD-10-CM | POA: Diagnosis not present

## 2019-07-03 LAB — COMPREHENSIVE METABOLIC PANEL
ALT: 14 U/L (ref 0–35)
AST: 12 U/L (ref 0–37)
Albumin: 4 g/dL (ref 3.5–5.2)
Alkaline Phosphatase: 120 U/L — ABNORMAL HIGH (ref 39–117)
BUN: 12 mg/dL (ref 6–23)
CO2: 25 mEq/L (ref 19–32)
Calcium: 9.4 mg/dL (ref 8.4–10.5)
Chloride: 98 mEq/L (ref 96–112)
Creatinine, Ser: 1.02 mg/dL (ref 0.40–1.20)
GFR: 67.05 mL/min (ref >60.00)
Glucose, Bld: 343 mg/dL — ABNORMAL HIGH (ref 70–99)
Potassium: 4.3 mEq/L (ref 3.5–5.1)
Sodium: 133 mEq/L — ABNORMAL LOW (ref 135–145)
Total Bilirubin: 0.6 mg/dL (ref 0.2–1.2)
Total Protein: 7 g/dL (ref 6.0–8.3)

## 2019-07-03 LAB — LIPID PANEL
Cholesterol: 201 mg/dL — ABNORMAL HIGH (ref 0–200)
HDL: 47.2 mg/dL (ref >39.00)
LDL Cholesterol: 138 mg/dL — ABNORMAL HIGH (ref 0–99)
NonHDL: 153.83
Total CHOL/HDL Ratio: 4
Triglycerides: 80 mg/dL (ref 0.0–149.0)
VLDL: 16 mg/dL (ref 0.0–40.0)

## 2019-07-03 LAB — CBC WITH DIFFERENTIAL/PLATELET
Basophils Absolute: 0 10*3/uL (ref 0.0–0.1)
Basophils Relative: 0.4 % (ref 0.0–3.0)
Eosinophils Absolute: 0.1 10*3/uL (ref 0.0–0.7)
Eosinophils Relative: 1.6 % (ref 0.0–5.0)
HCT: 40.7 % (ref 36.0–46.0)
Hemoglobin: 13 g/dL (ref 12.0–15.0)
Lymphocytes Relative: 34.1 % (ref 12.0–46.0)
Lymphs Abs: 1.4 10*3/uL (ref 0.7–4.0)
MCHC: 32.1 g/dL (ref 30.0–36.0)
MCV: 83 fl (ref 78.0–100.0)
Monocytes Absolute: 0.3 10*3/uL (ref 0.1–1.0)
Monocytes Relative: 8.4 % (ref 3.0–12.0)
Neutro Abs: 2.3 10*3/uL (ref 1.4–7.7)
Neutrophils Relative %: 55.5 % (ref 43.0–77.0)
Platelets: 268 10*3/uL (ref 150.0–400.0)
RBC: 4.9 Mil/uL (ref 3.87–5.11)
RDW: 13.8 % (ref 11.5–15.5)
WBC: 4.1 10*3/uL (ref 4.0–10.5)

## 2019-07-03 LAB — TSH: TSH: 0.75 u[IU]/mL (ref 0.35–4.50)

## 2019-07-03 LAB — HEMOGLOBIN A1C: Hgb A1c MFr Bld: 14.6 % — ABNORMAL HIGH (ref 4.6–6.5)

## 2019-07-03 NOTE — Progress Notes (Signed)
Patient: Kaitlyn Cervantes MRN: [id number] DOB: 1961-04-15 PCP: Benay Pike, MD     Subjective:  Chief Complaint  Patient presents with  . Hyperlipidemia  . Annual Exam  . Hypertension  . Diabetes    HPI: The patient is a 58 y.o. female who presents today for annual exam. She denies any changes to past medical history. There have been no recent hospitalizations. They are not following a well balanced diet and exercise plan. Weight has been decreasing steadily.   Hypertension: Here for follow up of hypertension.  Currently on lisinopril 40mg , coreg and spironolactone .Takes medication as prescribed and denies any side effects. Exercise includes none. Weight has been stable. Denies any chest pain, headaches, shortness of breath, vision changes, swelling in lower extremities.   Hyperlipidemia: Was on crestor but stopped this on her own accord. She thinks her father had heart disease, but is unsure. He did not have a heart attack. No history in her siblings. She has uncontrolled diabetes, HTN and hyperlipidemia. She has never smoked.   The 10-year ASCVD risk score Mikey Bussing DC Jr., et al., 2013) is: 22.3%   Diabetes: Patient is here for follow up of type 2 diabetes. First diagnosed 2012.   Currently on the following medications none. Stopped her metformin because she thinks it causes cancer. She stopped this in July of 2020.  Last A1C was 10.0. Currently NOT exercising and following diabetic diet. Denies any hypoglycemic events. Denies any vision changes, nausea, vomiting, abdominal pain, ulcers/paraesthesia in feet, polyuria, polydipsia or polyphagia. Denies any chest pain, shortness of breath.   CHF, unsure if systolic/diastolic. Seen by [facility]. Just seen this year. On bumex, coreg, lisinopril and spironolactone. euvolemic today. Asymptomatic.    Immunization History  Administered Date(s) Administered  . Pneumococcal Polysaccharide-23 11/25/2015  . Td 01/05/2018   Colonoscopy:  03/02/2017. F/u in 10 years Mammogram: 01/02/2019 Pap smear: 12/22/2017 She declines flu shot.   Review of Systems  Constitutional: Negative for fatigue.  Eyes: Negative for visual disturbance.  Respiratory: Negative for shortness of breath.   Cardiovascular: Positive for leg swelling. Negative for chest pain and palpitations.       B/l leg swelling-states that is chronic over the past 2 + yrs.  Pt reports that legs are swollen most every day  Gastrointestinal: Negative for abdominal pain, diarrhea, nausea and vomiting.  Endocrine: Negative for cold intolerance, heat intolerance, polydipsia and polyuria.  Genitourinary: Negative for dysuria, frequency and urgency.  Musculoskeletal: Negative for back pain and neck pain.  Skin: Negative for rash.  Neurological: Negative for dizziness and headaches.  Psychiatric/Behavioral: Negative for sleep disturbance.    Allergies Patient is allergic to iohexol and other.  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  Surgical History Patient  has a past surgical history that includes kidney stones (2007); Splenectomy; Cesarean section; and right mastectomy.  Family History Pateint's family history includes Breast cancer in her maternal aunt and mother; Cancer in her maternal aunt, mother, and sister; Heart disease in her father.  Social History Patient  reports that she has never smoked. She has never used smokeless tobacco. She reports that she does not drink alcohol or use drugs.    Objective: Vitals:   07/03/19 1041  BP: (!) 142/88  Pulse: 71  Temp: 98.3 F (36.8 C)  TempSrc: Skin  SpO2: 95%  Weight: 220 lb 9.6 oz (100.1 kg)  Height: 5\' 4"   (1.626 m)    Body mass index is 37.87 kg/m.  Physical Exam Vitals reviewed.  Constitutional:      Appearance: Normal appearance. She is well-developed. She is obese.  HENT:     Head: Normocephalic and atraumatic.     Right Ear: Tympanic membrane, ear canal and external ear normal.     Left Ear: Tympanic membrane, ear canal and external ear normal.     Nose: Nose normal.     Mouth/Throat:     Mouth: Mucous membranes are moist.  Eyes:     Conjunctiva/sclera: Conjunctivae normal.     Pupils: Pupils are equal, round, and reactive to light.  Neck:     Thyroid: No thyromegaly.  Cardiovascular:     Rate and Rhythm: Normal rate and regular rhythm.     Heart sounds: Normal heart sounds. No murmur.  Pulmonary:     Effort: Pulmonary effort is normal.     Breath sounds: Normal breath sounds.  Abdominal:     General: Bowel sounds are normal. There is no distension.     Palpations: Abdomen is soft.     Tenderness: There is no abdominal tenderness.  Musculoskeletal:     Cervical back: Normal range of motion and neck supple.     Right lower leg: Edema present.     Left lower leg: Edema present.  Lymphadenopathy:     Cervical: No cervical adenopathy.  Skin:    General: Skin is warm and dry.     Findings: No rash.  Neurological:     General: No focal deficit present.     Mental Status: She is alert and oriented to person, place, and time.     Cranial Nerves: No cranial nerve deficit.     Coordination: Coordination normal.     Deep Tendon Reflexes: Reflexes normal.  Psychiatric:        Mood and Affect: Mood normal.        Behavior: Behavior normal.    Depression screen [facility] 2/9 07/03/2019 02/01/2019 12/17/2018 10/25/2017 09/22/2017  Decreased Interest 0 0 0 0 0  Down, Depressed, Hopeless 0 0 0 0 0  PHQ - 2 Score 0 0 0 0 0       Assessment/plan: 1. Annual physical exam UTD on health maintenance. Declines the flu shot today. Checking labs. F/u in one year for annual.  Patient  counseling [x]    Nutrition: Stressed importance of moderation in sodium/caffeine intake, saturated fat and cholesterol, caloric balance, sufficient intake of fresh fruits, vegetables, fiber, calcium, iron, and 1 mg of folate supplement per day (for females capable of pregnancy).  [x]    Stressed the importance of regular exercise.   []    Substance Abuse: Discussed cessation/primary prevention of tobacco, alcohol, or other drug use; driving or other dangerous activities under the influence; availability of treatment for abuse.   [x]    Injury prevention: Discussed safety belts, safety helmets, smoke detector, smoking near bedding or upholstery.   [x]    Sexuality: Discussed sexually transmitted diseases, partner selection, use of condoms, avoidance of unintended pregnancy  and contraceptive alternatives.  [x]    Dental health: Discussed importance of regular tooth brushing, flossing, and dental visits.  [x]    Health maintenance and immunizations reviewed. Please refer to Health maintenance section.     2. Essential hypertension, benign Her blood pressure is  above goal for diabetes and discussed with her I would like her <130/80. This is my first time meeting her and she is followed by cardiology. Will see her back in 3 months and adjust if needed at that time. Encouraged her to work on diet/exercise and weight loss.   3. Uncontrolled type 2 diabetes mellitus with hyperglycemia ([place]) Poorly controlled with a1c of 10.0 back in July. On no medication. Was well controlled in the past. She does not want metformin as she is nervous it will cause cancer and this is why she stopped it. We can work with other drugs if this will be a barrier for her taking medication, but with a1c so high discussed may have to start insulin. We will see where she is at. Really needs to work on diet/exercise and weigh loss. Eye referral placed. Discussed she needs to be on statin. She stopped taking that as well. Has had her foot  exam and pneumonia shot. F/u in 3 months, will adjust meds when I get labs back.  - CBC with Differential/Platelet - Comprehensive metabolic panel - Lipid panel - TSH - Hemoglobin A1c - Ambulatory referral to Ophthalmology  4. Mixed hyperlipidemia Was on crestor, but she stopped this on her own accord. Discussed she really needs to be on this. Will get labs back and go from there. ascvd risk >22%.    This visit occurred during the SARS-CoV-2 public health emergency.  Safety protocols were in place, including screening questions prior to the visit, additional usage of staff PPE, and extensive cleaning of exam room while observing appropriate contact time as indicated for disinfecting solutions.     Return in about 3 months (around 10/01/2019) for diabetes/htn .     Orma Flaming, MD [place] [facility]  07/03/2019

## 2019-07-03 NOTE — Patient Instructions (Signed)
So nice to meet you! You're doing great on your health maintenance. I referred you to an eye doctor for exam, they will call you with this appointment.   routine labs today and once I have these back we can make a plan for diabetes/cholesterol.   [place]! Dr. Rogers Blocker

## 2019-07-10 ENCOUNTER — Telehealth: Payer: Self-pay | Admitting: *Deleted

## 2019-07-10 NOTE — Telephone Encounter (Signed)
Patient called for lab results. No one on the line when agent transferred the call. TC to patient. Left VM to return the call to discuss labs.

## 2019-07-16 ENCOUNTER — Telehealth: Payer: Self-pay | Admitting: Family Medicine

## 2019-07-16 DIAGNOSIS — IMO0002 Reserved for concepts with insufficient information to code with codable children: Secondary | ICD-10-CM

## 2019-07-16 DIAGNOSIS — E1165 Type 2 diabetes mellitus with hyperglycemia: Secondary | ICD-10-CM

## 2019-07-16 NOTE — Telephone Encounter (Signed)
Left vm message for patient advising that we never r/c the result note/message that was supposed to have been routed to [place].  Please advise further, thanks.

## 2019-07-16 NOTE — Telephone Encounter (Signed)
Pt called asking if her diabetic medication was prescribed. Pt asked for nurse to call her back and leave a message. Please advise.

## 2019-07-17 ENCOUNTER — Telehealth: Payer: Self-pay | Admitting: Family Medicine

## 2019-07-17 ENCOUNTER — Other Ambulatory Visit: Payer: Self-pay | Admitting: Family Medicine

## 2019-07-17 MED ORDER — JARDIANCE 25 MG PO TABS: ORAL_TABLET | ORAL | 0 refills | Status: DC

## 2019-07-17 NOTE — Telephone Encounter (Signed)
Let her know that we will watch her renal function with addition of new medication and check this at her f/u in 3 months time.  Kaitlyn Flaming, MD [place]

## 2019-07-17 NOTE — Telephone Encounter (Signed)
Left very detailed vm message for patient regarding notes per Dr. Rogers Blocker.  Advised patient to c/b w/questions or concerns.

## 2019-07-17 NOTE — Telephone Encounter (Signed)
Please let her know that metformin alone will not take care of the diabetes. Her diabetes is exteremly, extremely out of control. The metformin would bring it down a point. Her a1c was over 14, goal is 6.5 or less. This is why im recommending insulin or another injectable drug that is not insulin. Jardiance as well will not get this under control. If I remember correctly, she did nto want to take the metformin. I have sent in the jardiance for her. Can cause UTI or yeast infection so let me know if that happens. She will need to follow up in 3 months. She has a lot of work to do on diet and exercise and weight loss. An a1c this high only drastically increases her risk of heart attack ,stroke, kidney damage, eye damage, etc. Im not sure why she is not wanting to take medication to get this under control, again, I advise using insulin or another injectable. Will see her back in 3 months.  Orma Flaming, MD [place]

## 2019-07-18 ENCOUNTER — Telehealth: Payer: Self-pay | Admitting: Family Medicine

## 2019-07-18 MED ORDER — METFORMIN HCL 1000 MG PO TABS: 1000.0000 mg | ORAL_TABLET | Freq: Two times a day (BID) | ORAL | 3 refills | Status: DC

## 2019-07-18 NOTE — Telephone Encounter (Signed)
I sent in metformin for her and requested name brand Glucophage; however, I have no idea if her insurance will cover brand name. Would have her start out with half a pill twice a day for 1 weeks then increase to 1 full pill in am then 1/2 pill in pm for a week then bump up to full dose of 1 pill twice a day with food.  Labs and appointment in 3 months.  Dr. Rogers Blocker

## 2019-07-18 NOTE — Addendum Note (Signed)
Addended by: Orma Flaming on: 07/18/2019 01:39 PM   Modules accepted: Orders

## 2019-07-18 NOTE — Telephone Encounter (Signed)
Spoke to patient and she states that she is willing to take glucophage along with jardiance but still refuses ANY injectable medications or diabetic education.  She states that she will continue working on diet/exercise/weight loss.  Please advise, thanks

## 2019-07-18 NOTE — Telephone Encounter (Signed)
Error

## 2019-07-18 NOTE — Telephone Encounter (Signed)
Left detailed vm message for patient advising of notes per Dr. Rogers Blocker.  Advised her to c/b w/any questions or concerns.

## 2019-07-19 ENCOUNTER — Telehealth: Payer: Self-pay

## 2019-07-19 NOTE — Telephone Encounter (Signed)
Received fax from [place] on [address] stating that Kaitlyn Cervantes needs PA.  We will not do this and left detailed vm message for patient advising that Kaitlyn Cervantes needs to find out which SGLT-2 inhibitor drugs are covered under her insurance.  Advised patient to c/b with this information.

## 2019-07-30 ENCOUNTER — Telehealth: Payer: Self-pay | Admitting: Family Medicine

## 2019-07-31 ENCOUNTER — Ambulatory Visit (INDEPENDENT_AMBULATORY_CARE_PROVIDER_SITE_OTHER): Payer: 59 | Admitting: Family Medicine

## 2019-07-31 ENCOUNTER — Encounter: Payer: Self-pay | Admitting: Family Medicine

## 2019-07-31 VITALS — Temp 97.3°F | Ht 64.0 in | Wt 220.0 lb

## 2019-07-31 DIAGNOSIS — R109 Unspecified abdominal pain: Secondary | ICD-10-CM | POA: Diagnosis not present

## 2019-07-31 DIAGNOSIS — R197 Diarrhea, unspecified: Secondary | ICD-10-CM

## 2019-07-31 NOTE — Telephone Encounter (Signed)
Error

## 2019-07-31 NOTE — Progress Notes (Signed)
Patient: Kaitlyn Cervantes MRN: [id number] DOB: [birth year] PCP: Orma Flaming, MD     I connected with Kaitlyn Cervantes on 07/31/19 at 10:25am by a video enabled telemedicine application and verified that I am speaking with the correct person using two identifiers.  Location patient: Home Location provider: [facility], Office Persons participating in this virtual visit: Kaitlyn Cervantes and Dr. Rogers Blocker   I discussed the limitations of evaluation and management by telemedicine and the availability of in person appointments. The patient expressed understanding and agreed to proceed.   Interactive audio and video telecommunications were attempted between this provider and patient, however failed, due to patient having technical difficulties OR patient did not have access to video capability.  We continued and completed visit with audio only.    Subjective:  Chief Complaint  Patient presents with  . Diarrhea    Started on Jan 7th  . Chills  . Loss of smell and Taste    HPI: The patient is a 59 y.o. female who presents today for possible covid. Her daughter tested positive on 07/17/19. They live in same home. She started to quarantine since her daughter tested positive. She started to get symptoms on 1/7 with chills, diarrhea, loss of smell and taste, loss of appetite. She has a mild cough and congestion. No fever. She states the diarreha is not getting better. She still has no appetite and her smell and taste are still gone as well, but other sympotms have gotten better. She has a lot of stomach pain that is concerning for her. She states her stomach started to hurt about 8 days ago. Pain is the mid part of her stomach. Denies any blood in her stool, but stool is dark since startin kopecktate. She states she is having watery diarrhea and can range from 1-3 times/day. Stomach pain rated as a 8/10 at it's worse, but it's only 2/10. She described it as burning. No radiation. On her cscope from 2018 she  had a few diverticulae. Denies any epigasric pain. She is worried if she had covid and is still having symptoms she could have increased infection risks.   Review of Systems  Constitutional: Positive for appetite change, chills and fatigue.  HENT: Positive for sinus pain.   Respiratory: Negative for cough, chest tightness, shortness of breath and wheezing.   Cardiovascular: Negative for chest pain and palpitations.  Gastrointestinal: Positive for abdominal pain and diarrhea. Negative for abdominal distention and blood in stool.  Neurological: Positive for weakness.    Allergies Patient is allergic to iohexol and other.  Past Medical History Patient  has a past medical history of Anemia (2009), Blood transfusion without reported diagnosis (2009), Breast cancer of upper-inner quadrant of right female breast ([place]) (10/19/2015), Diabetes mellitus without complication ([place]), Gallstones, History of chicken pox, History of stomach ulcers, Hypertension, Kidney stones, Sleep apnea (2005), and UTI (urinary tract infection).  Surgical History Patient  has a past surgical history that includes kidney stones (2007); Splenectomy; Cesarean section; right mastectomy; and Breast biopsy.  Family History Pateint's family history includes Breast cancer in her maternal aunt and mother; Cancer in her maternal aunt, mother, and sister; Depression in her brother; Diabetes in her brother and mother; Drug abuse in her brother; Early death in her father; Heart disease in her father; Hypertension in her mother; Mental illness in her brother.  Social History Patient  reports that she has never smoked. She has never used smokeless tobacco. She reports that she does not drink  alcohol or use drugs.    Objective: Vitals:   07/31/19 0859  Temp: (!) 97.3 F (36.3 C)  TempSrc: Temporal  Weight: 220 lb (99.8 kg)  Height: 5\' 4"  (1.626 m)    Body mass index is 37.76 kg/m.  Physical Exam     Assessment/plan: 1.  Stomach pain Seems to be improving and is only a 2/10 today. On red flags on exam regarding stomach pain. She is out of window for covid testing at this point. She has been quarantined since 1/7 but still having symptoms, although improving. Sending to urgent care for labs for dehydration and concern for her uncontrolled diabetes and complications with this.    2. Diarrhea, unspecified type likely secondary  to covid. She is out of window for testing and diarrhea is down to one episode a day. She is weak though and has very uncontrolled diabetes. With risk of dehydration, DKA, AKI etc. I would rather her go and get labs done today and advised that she be seen at urgent care right away. She is in agreement with this plan. Also discussed I could order a GI stool panel if diarrhea persists, but will go to urgent care at this time.       Return if symptoms worsen or fail to improve.  Records requested if needed. Time spent with patient: 25 minutes, of which >50% was spent in obtaining information about her symptoms, reviweing her previous labs, evaluations, and treatments, counseling her about her conditions (please see discussed topics above), and developing a plan to further investigate it; she had a number of questions which I addressed.    Orma Flaming, MD [place]  07/31/2019

## 2019-08-04 ENCOUNTER — Other Ambulatory Visit: Payer: Self-pay

## 2019-08-04 ENCOUNTER — Ambulatory Visit (HOSPITAL_COMMUNITY)
Admission: EM | Admit: 2019-08-04 | Discharge: 2019-08-04 | Disposition: A | Discharge status: Home / Self Care | Payer: 59 | Attending: Internal Medicine | Admitting: Internal Medicine

## 2019-08-04 ENCOUNTER — Encounter (HOSPITAL_COMMUNITY): Payer: Self-pay

## 2019-08-04 DIAGNOSIS — J019 Acute sinusitis, unspecified: Secondary | ICD-10-CM | POA: Diagnosis not present

## 2019-08-04 DIAGNOSIS — H538 Other visual disturbances: Secondary | ICD-10-CM

## 2019-08-04 LAB — GLUCOSE, CAPILLARY: Glucose-Capillary: 160 mg/dL — ABNORMAL HIGH (ref 70–99)

## 2019-08-04 LAB — CBG MONITORING, ED: Glucose-Capillary: 160 mg/dL — ABNORMAL HIGH (ref 70–99)

## 2019-08-04 MED ORDER — TETRACAINE HCL 0.5 % OP SOLN
OPHTHALMIC | Status: AC
  Filled 2019-08-04: qty 4

## 2019-08-04 MED ORDER — AMOXICILLIN-POT CLAVULANATE 875-125 MG PO TABS: 1.0000 | ORAL_TABLET | Freq: Two times a day (BID) | ORAL | 0 refills | Status: AC

## 2019-08-04 NOTE — ED Provider Notes (Signed)
Kaitlyn Cervantes    CSN: [id number] Arrival date & time: 08/04/19  1524      History   Chief Complaint Chief Complaint  Patient presents with  . Blurred Vision    HPI Kaitlyn Cervantes is a 59 y.o. female history of hypertension, DM type II, hyperlipidemia, CHF, presenting today for evaluation of blurry vision.  Patient states that over the past 2 to 3 days she has developed blurry vision.  She feels as if she cannot see as she typically does.  She typically uses reading glasses for short distance and reading, but would be able to see relatively normally distance.  Now she can not focus and see with distance either.  This was first noticed while she was cleaning, unclear if acute onset.  She denies associated pain.  Denies drainage.  Denies redness.  Denies prior history of eye issues.  Follows up with ophthalmology annually, denies any prior issues.  She did recently have Covid a few weeks ago and has finished her quarantine.  Her symptoms have largely improved.  She does continue to have some mild congestion and drainage and reports some occasional sinus burning below her eyes.  She denies fevers.  Denies weakness.  Denies difficulty speaking.  HPI  Past Medical History:  Diagnosis Date  . Anemia 2009  . Blood transfusion without reported diagnosis 2009  . Breast cancer of upper-inner quadrant of right female breast ([place]) 10/19/2015   BRCA I & II negative  . Diabetes mellitus without complication ([place])   . Gallstones    has not had removed  . History of chicken pox   . History of stomach ulcers    treated   . Hypertension   . Kidney stones   . Sleep apnea 2005  . UTI (urinary tract infection)     Patient Active Problem List   Diagnosis Date Noted  . Congestive heart failure ([place]) 08/01/2016  . Malignant neoplasm of upper-inner quadrant of right female breast ([place]) 10/19/2015  . Kidney stone 09/19/2014  . Hyperlipidemia 10/30/2013  . Type 2 diabetes mellitus without  complication, without long-term current use of insulin ([place]) 10/29/2013  . Essential hypertension, benign 10/29/2013  . History of obstructive sleep apnea 10/29/2013    Past Surgical History:  Procedure Laterality Date  . BREAST BIOPSY    . CESAREAN SECTION     x3  . kidney stones  2007  . right mastectomy     07/2016  . SPLENECTOMY     age 60     OB History    Gravida  3   Para  3   Term      Preterm      AB      Living  3     SAB      TAB      Ectopic      Multiple      Live Births               Home Medications    Prior to Admission medications   Medication Sig Start Date End Date Taking? Authorizing Provider  amoxicillin-clavulanate (AUGMENTIN) 875-125 MG tablet Take 1 tablet by mouth every 12 (twelve) hours for 7 days. 08/04/19 08/11/19  Winnona Wargo C, PA-C  aspirin EC 81 MG tablet Take 81 mg by mouth daily.    [provider]  Blood Glucose Monitoring Suppl (ONETOUCH VERIO) w/Device KIT 1 kit by Does not apply route every morning. 06/13/17  Carlyle Dolly, MD  bumetanide (BUMEX) 1 MG tablet Take 1 mg by mouth daily.  07/15/16   [provider]  Capsaicin 0.075 % GEL Apply to affected area 3-4 times daily Patient not taking: Reported on 07/31/2019 10/25/17   Mayo, Pete Pelt, MD  carvedilol (COREG) 6.25 MG tablet Take 6.25 mg by mouth 2 (two) times daily with a meal.  01/01/16   [provider]  Cholecalciferol (VITAMIN D3) 5000 units TABS Take 5,000 Units by mouth daily.    [provider]  Coenzyme Q10 (COQ10) 100 MG CAPS Take 1 capsule by mouth daily.    [provider]  empagliflozin (JARDIANCE) 25 MG TABS tablet 1/2 tab x 1 week then increase to full pill Patient not taking: Reported on 07/31/2019 07/17/19   Orma Flaming, MD  Garlic 7371 MG CAPS Take 1 capsule by mouth daily.    [provider]  glucose blood (ONETOUCH VERIO) test strip Check blood sugar 3 x daily 09/13/17   Carlyle Dolly, MD  Lancet Devices (ONE TOUCH DELICA LANCING DEV) MISC 1 application by Does not apply route every morning. 09/13/17   Carlyle Dolly, MD  lidocaine-prilocaine (EMLA) cream Apply thin layer to skin as needed before treatment 05/11/16   [provider]  lisinopril (ZESTRIL) 40 MG tablet Take 1 tablet (40 mg total) by mouth daily. 11/27/18   Daisy Floro, DO  metFORMIN (GLUCOPHAGE) 1000 MG tablet Take 1 tablet (1,000 mg total) by mouth 2 (two) times daily with a meal. 07/18/19   Orma Flaming, MD  Multiple Vitamin (MULTIVITAMIN WITH MINERALS) TABS tablet Take 1 tablet by mouth daily.    [provider]  mupirocin ointment (BACTROBAN) 2 % Place 1 application into the nose 2 (two) times daily. Patient not taking: Reported on 07/31/2019 02/08/19   Gladys Damme, MD  [facility] DELICA LANCETS FINE MISC 1 application by Does not apply route 2 (two) times daily. 06/14/17   Carlyle Dolly, MD  rosuvastatin (CRESTOR) 10 MG tablet Take 1 tablet (10 mg total) by mouth daily. Patient not taking: Reported on 07/03/2019 09/05/17   Carlyle Dolly, MD  spironolactone (ALDACTONE) 25 MG tablet Take 25 mg by mouth daily.  01/01/16   [provider]    Family History Family History  Problem Relation Age of Onset  . Breast cancer Mother        x2 80's & 34's  . Cancer Mother   . Diabetes Mother   . Hypertension Mother   . Heart disease Father   . Early death Father   . Cancer Sister   . Cancer Maternal Aunt   . Breast cancer Maternal Aunt        mid 74's  . Diabetes Brother   . Depression Brother   . Drug abuse Brother   . Mental illness Brother   . Colon polyps Neg Hx   . Colon cancer Neg Hx   . Esophageal cancer Neg Hx   . Rectal cancer Neg Hx   . Stomach cancer Neg Hx     Social History Social History   Tobacco Use  . Smoking status: Never Smoker  . Smokeless tobacco: Never Used  Substance Use Topics  . Alcohol use: No    Comment: very rare  .  Drug use: No     Allergies   Iohexol and Other   Review of Systems Review of Systems  Constitutional: Negative for activity change, appetite change, chills,  fatigue and fever.  HENT: Positive for congestion and sinus pressure. Negative for ear pain, rhinorrhea, sore throat and trouble swallowing.   Eyes: Positive for visual disturbance. Negative for discharge and redness.  Respiratory: Negative for cough, chest tightness and shortness of breath.   Cardiovascular: Negative for chest pain.  Gastrointestinal: Negative for abdominal pain, diarrhea, nausea and vomiting.  Musculoskeletal: Negative for myalgias.  Skin: Negative for rash.  Neurological: Negative for dizziness, light-headedness and headaches.     Physical Exam Triage Vital Signs ED Triage Vitals  Enc Vitals Group     BP 08/04/19 1535 (!) 165/88     Pulse Rate 08/04/19 1535 87     Resp 08/04/19 1535 17     Temp 08/04/19 1538 98.5 F (36.9 C)     Temp Source 08/04/19 1538 Oral     SpO2 08/04/19 1535 99 %     Weight --      Height --      Head Circumference --      Peak Flow --      Pain Score 08/04/19 1650 0     Pain Loc --      Pain Edu? --      Excl. in [place]? --    No data found.  Updated Vital Signs BP (!) 165/88 (BP Location: Left Arm)   Pulse 87   Temp 98.5 F (36.9 C) (Oral)   Resp 17   SpO2 99%   Visual Acuity Right Eye Distance:  20/200 Left Eye Distance:  20/200 Bilateral Distance:  20/200  Right Eye Near:   Left Eye Near:    Bilateral Near:     Physical Exam Vitals and nursing note reviewed.  Constitutional:      General: She is not in acute distress.    Appearance: She is well-developed.  HENT:     Head: Normocephalic and atraumatic.     Ears:     Comments: Bilateral ears without tenderness to palpation of external auricle, tragus and mastoid, EAC's without erythema or swelling, TM's with good bony landmarks and cone of light. Non erythematous.     Nose:     Comments: Nasal mucosa  pink, nonswollen turbinates    Mouth/Throat:     Comments: Oral mucosa pink and moist, no tonsillar enlargement or exudate. Posterior pharynx patent and nonerythematous, no uvula deviation or swelling. Normal phonation. Eyes:     Extraocular Movements: Extraocular movements intact.     Conjunctiva/sclera: Conjunctivae normal.     Pupils: Pupils are equal, round, and reactive to light.     Comments: Bilateral conjunctiva without erythema, anterior chamber clear, no photophobia with exam, no visible drainage or discharge, visual fields intact with confrontation  Cardiovascular:     Rate and Rhythm: Normal rate and regular rhythm.     Heart sounds: No murmur.  Pulmonary:     Effort: Pulmonary effort is normal. No respiratory distress.     Breath sounds: Normal breath sounds.  Abdominal:     Palpations: Abdomen is soft.     Tenderness: There is no abdominal tenderness.  Musculoskeletal:     Cervical back: Neck supple.  Skin:    General: Skin is warm and dry.  Neurological:     Mental Status: She is alert.     Comments: Patient A&O x3, cranial nerves II-XII grossly intact, strength at shoulders, hips and knees 5/5, equal bilaterally, patellar reflex 1+ bilaterally.  Gait without abnormality.      UC Treatments /  Results  Labs (all labs ordered are listed, but only abnormal results are displayed) Labs Reviewed  GLUCOSE, CAPILLARY - Abnormal; Notable for the following components:      Result Value   Glucose-Capillary 160 (*)    All other components within normal limits  CBG MONITORING, ED - Abnormal; Notable for the following components:   Glucose-Capillary 160 (*)    All other components within normal limits    EKG   Radiology No results found.  Procedures Procedures (including critical care time)  Medications Ordered in UC Medications - No data to display  Initial Impression / Assessment and Plan / UC Course  I have reviewed the triage vital signs and the nursing  notes.  Pertinent labs & imaging results that were available during my care of the patient were reviewed by me and considered in my medical decision making (see chart for details).     EKG normal sinus rhythm, no acute signs of ischemia or infarction.  No sign of infection, without pain.  Patient declined checking pressure.  No neuro deficits.  Increased risk of possible clots with recent Covid, but given bilateral feel this is less likely in no other signs of stroke.  Discussed with Dr. Alanda Slim who felt symptoms most likely diabetic retinopathy.  Will have patient follow-up in clinic this week.  Advised to follow-up in emergency room if developing any sudden changing vision, pain or redness.   Patient is very concerned about possible infection causing her symptoms, discussed I did not feel Covid was likely a trigger at this and no sign of infection in her eye.  Given she does still have continued sinus drainage and mucus we will go ahead and cover for sinusitis with Augmentin.  Discussed strict return precautions. Patient verbalized understanding and is agreeable with plan.  Final Clinical Impressions(s) / UC Diagnoses   Final diagnoses:  Blurry vision  Acute sinusitis with symptoms greater than 10 days     Discharge Instructions     Please call Dr. Colan Neptune office in the morning to schedule a follow up appointment this week  You may begin Augmentin twice daily for 1 week  If you develop worsening, blackening, eye pain please follow-up in the emergency room   ED Prescriptions    Medication Sig Dispense Auth. Provider   amoxicillin-clavulanate (AUGMENTIN) 875-125 MG tablet Take 1 tablet by mouth every 12 (twelve) hours for 7 days. 14 tablet Yelina Sarratt, [place] C, PA-C     PDMP not reviewed this encounter.   Janith Lima, [place] 08/04/19 1839

## 2019-08-04 NOTE — ED Triage Notes (Signed)
Pt presents with blurred vision over the past few days; pt states she has been quarantining since January 8th since her daughter tested positive for covid and she started displaying symptoms.

## 2019-08-04 NOTE — Discharge Instructions (Signed)
Please call Dr. Colan Neptune office in the morning to schedule a follow up appointment this week  You may begin Augmentin twice daily for 1 week  If you develop worsening, blackening, eye pain please follow-up in the emergency room

## 2019-08-22 ENCOUNTER — Telehealth: Payer: Self-pay | Admitting: Family Medicine

## 2019-08-22 NOTE — Telephone Encounter (Signed)
Patient called in this morning saying that she had some tests done on 07/03/19 and was told she would receive a copy of her blood work but she has not received it and wanted to know if she could get that mailed to her. Asked to give her a call when it gets sent out.

## 2019-08-23 NOTE — Telephone Encounter (Signed)
Melitta,  Please print off labs from 07/03/19 and mail to patient.  Thanks!  Aw

## 2019-08-23 NOTE — Telephone Encounter (Signed)
Printed and sent out for mailing.

## 2019-10-02 ENCOUNTER — Ambulatory Visit: Payer: 59 | Admitting: Family Medicine

## 2020-01-04 ENCOUNTER — Ambulatory Visit (HOSPITAL_COMMUNITY): Payer: Self-pay

## 2020-01-04 ENCOUNTER — Ambulatory Visit
Admission: EM | Admit: 2020-01-04 | Discharge: 2020-01-04 | Disposition: A | Discharge status: Home / Self Care | Payer: 59 | Attending: Physician Assistant | Admitting: Physician Assistant

## 2020-01-04 ENCOUNTER — Other Ambulatory Visit: Payer: Self-pay

## 2020-01-04 ENCOUNTER — Ambulatory Visit (HOSPITAL_COMMUNITY)
Admission: EM | Admit: 2020-01-04 | Discharge: 2020-01-04 | Disposition: A | Discharge status: Home / Self Care | Payer: 59

## 2020-01-04 DIAGNOSIS — R6883 Chills (without fever): Secondary | ICD-10-CM | POA: Diagnosis not present

## 2020-01-04 DIAGNOSIS — N3090 Cystitis, unspecified without hematuria: Secondary | ICD-10-CM

## 2020-01-04 DIAGNOSIS — E1165 Type 2 diabetes mellitus with hyperglycemia: Secondary | ICD-10-CM

## 2020-01-04 LAB — POCT URINALYSIS DIP (MANUAL ENTRY)
Bilirubin, UA: NEGATIVE
Glucose, UA: 500 mg/dL — AB
Ketones, POC UA: NEGATIVE mg/dL
Nitrite, UA: NEGATIVE
Protein Ur, POC: 100 mg/dL — AB
Spec Grav, UA: 1.015 (ref 1.010–1.025)
Urobilinogen, UA: 0.2 E.U./dL
pH, UA: 6.5 (ref 5.0–8.0)

## 2020-01-04 LAB — POCT FASTING CBG KUC MANUAL ENTRY: POCT Glucose (KUC): 348 mg/dL — AB (ref 70–99)

## 2020-01-04 MED ORDER — CEPHALEXIN 500 MG PO CAPS
500.0000 mg | ORAL_CAPSULE | Freq: Two times a day (BID) | ORAL | 0 refills | Status: DC
Start: 2020-01-04 — End: 2020-07-10

## 2020-01-04 NOTE — Discharge Instructions (Signed)
Start keflex as directed for urinary tract infection. Covid testing ordered, to quarantine until testing results return. Keep hydrated, urine should be clear to pale yellow in color. Monitor blood glucose closely. If symptoms worsen, with fever, abdominal pain, nausea/vomiting, go to the emergency department for further evaluation.

## 2020-01-04 NOTE — ED Provider Notes (Signed)
[facility]    CSN: [id number] Arrival date & time: 01/04/20  1502      History   Chief Complaint Chief Complaint  Patient presents with  . Chills  . Hyperglycemia    400 this a.m.  . Anorexia    HPI Kaitlyn Cervantes is a 59 y.o. female.   59 year old female with history of breast cancer s/p mastectomy, DM, HTN, CHF comes in for 5 day history of chills, loss of appetite, urinary symptoms. She has had frequency, urgency, dark/cloudy urine. Denies abdominal pain, nausea/vomiting. Denies vaginal symptoms. Denies URI symptoms. Denies fever, flank pain. tmax 100. She has also noticed her CBG elevated during this time. Normal fasting 120s, states now in the 300-500s. Last a1c 14.6     Past Medical History:  Diagnosis Date  . Anemia 2009  . Blood transfusion without reported diagnosis 2009  . Breast cancer of upper-inner quadrant of right female breast ([place]) 10/19/2015   BRCA I & II negative  . Diabetes mellitus without complication ([place])   . Gallstones    has not had removed  . History of chicken pox   . History of stomach ulcers    treated   . Hypertension   . Kidney stones   . Sleep apnea 2005  . UTI (urinary tract infection)     Patient Active Problem List   Diagnosis Date Noted  . Congestive heart failure ([place]) 08/01/2016  . Malignant neoplasm of upper-inner quadrant of right female breast ([place]) 10/19/2015  . Kidney stone 09/19/2014  . Hyperlipidemia 10/30/2013  . Type 2 diabetes mellitus without complication, without long-term current use of insulin ([place]) 10/29/2013  . Essential hypertension, benign 10/29/2013  . History of obstructive sleep apnea 10/29/2013    Past Surgical History:  Procedure Laterality Date  . BREAST BIOPSY    . CESAREAN SECTION     x3  . kidney stones  2007  . right mastectomy     07/2016  . SPLENECTOMY     age 44     OB History    Gravida  3   Para  3   Term      Preterm      AB      Living  3     SAB        TAB      Ectopic      Multiple      Live Births               Home Medications    Prior to Admission medications   Medication Sig Start Date End Date Taking? Authorizing Provider  aspirin EC 81 MG tablet Take 81 mg by mouth daily.    [provider]  Blood Glucose Monitoring Suppl (ONETOUCH VERIO) w/Device KIT 1 kit by Does not apply route every morning. 06/13/17   Carlyle Dolly, MD  bumetanide (BUMEX) 1 MG tablet Take 1 mg by mouth daily.  07/15/16   [provider]  Capsaicin 0.075 % GEL Apply to affected area 3-4 times daily Patient not taking: Reported on 07/31/2019 10/25/17   Mayo, Pete Pelt, MD  carvedilol (COREG) 6.25 MG tablet Take 6.25 mg by mouth 2 (two) times daily with a meal.  01/01/16   [provider]  cephALEXin (KEFLEX) 500 MG capsule Take 1 capsule (500 mg total) by mouth 2 (two) times daily. 01/04/20   Ok Edwards, PA-C  Cholecalciferol (VITAMIN D3) 5000 units TABS  Take 5,000 Units by mouth daily.    [provider]  Coenzyme Q10 (COQ10) 100 MG CAPS Take 1 capsule by mouth daily.    [provider]  empagliflozin (JARDIANCE) 25 MG TABS tablet 1/2 tab x 1 week then increase to full pill Patient not taking: Reported on 07/31/2019 07/17/19   Orland Mustard, MD  Garlic 1000 MG CAPS Take 1 capsule by mouth daily.    [provider]  glucose blood (ONETOUCH VERIO) test strip Check blood sugar 3 x daily 09/13/17   Beaulah Dinning, MD  Lancet Devices (ONE TOUCH DELICA LANCING DEV) MISC 1 application by Does not apply route every morning. 09/13/17   Beaulah Dinning, MD  lidocaine-prilocaine (EMLA) cream Apply thin layer to skin as needed before treatment 05/11/16   [provider]  lisinopril (ZESTRIL) 40 MG tablet Take 1 tablet (40 mg total) by mouth daily. 11/27/18   Dollene Cleveland, DO  metFORMIN (GLUCOPHAGE) 1000 MG tablet Take 1 tablet (1,000 mg total) by mouth 2 (two) times daily with a meal. 07/18/19    Orland Mustard, MD  Multiple Vitamin (MULTIVITAMIN WITH MINERALS) TABS tablet Take 1 tablet by mouth daily.    [provider]  mupirocin ointment (BACTROBAN) 2 % Place 1 application into the nose 2 (two) times daily. Patient not taking: Reported on 07/31/2019 02/08/19   Shirlean Mylar, MD  [facility] DELICA LANCETS FINE MISC 1 application by Does not apply route 2 (two) times daily. 06/14/17   Beaulah Dinning, MD  rosuvastatin (CRESTOR) 10 MG tablet Take 1 tablet (10 mg total) by mouth daily. Patient not taking: Reported on 07/03/2019 09/05/17   Beaulah Dinning, MD  spironolactone (ALDACTONE) 25 MG tablet Take 25 mg by mouth daily.  01/01/16   [provider]    Family History Family History  Problem Relation Age of Onset  . Breast cancer Mother        x2 13's & 49's  . Cancer Mother   . Diabetes Mother   . Hypertension Mother   . Heart disease Father   . Early death Father   . Cancer Sister   . Cancer Maternal Aunt   . Breast cancer Maternal Aunt        mid 38's  . Diabetes Brother   . Depression Brother   . Drug abuse Brother   . Mental illness Brother   . Colon polyps Neg Hx   . Colon cancer Neg Hx   . Esophageal cancer Neg Hx   . Rectal cancer Neg Hx   . Stomach cancer Neg Hx     Social History Social History   Tobacco Use  . Smoking status: Never Smoker  . Smokeless tobacco: Never Used  Vaping Use  . Vaping Use: Never used  Substance Use Topics  . Alcohol use: No    Comment: very rare  . Drug use: No     Allergies   Iohexol and Other   Review of Systems Review of Systems  Reason unable to perform ROS: See HPI as above.     Physical Exam Triage Vital Signs ED Triage Vitals  Enc Vitals Group     BP 01/04/20 1513 (!) 158/80     Pulse Rate 01/04/20 1513 85     Resp 01/04/20 1513 20     Temp 01/04/20 1513 98 F (36.7 C)     Temp Source 01/04/20 1513 Oral     SpO2 01/04/20 1513 96 %  Weight --      Height --      Head  Circumference --      Peak Flow --      Pain Score 01/04/20 1520 0     Pain Loc --      Pain Edu? --      Excl. in [place]? --    No data found.  Updated Vital Signs BP (!) 158/80 (BP Location: Left Arm)   Pulse 85   Temp 98 F (36.7 C) (Oral)   Resp 20   SpO2 96%   Physical Exam Constitutional:      General: She is not in acute distress.    Appearance: She is well-developed. She is not ill-appearing, toxic-appearing or diaphoretic.  HENT:     Head: Normocephalic and atraumatic.  Eyes:     Conjunctiva/sclera: Conjunctivae normal.     Pupils: Pupils are equal, round, and reactive to light.  Cardiovascular:     Rate and Rhythm: Normal rate and regular rhythm.  Pulmonary:     Effort: Pulmonary effort is normal. No respiratory distress.     Comments: LCTAB Abdominal:     General: Bowel sounds are normal.     Palpations: Abdomen is soft.     Tenderness: There is no abdominal tenderness. There is no right CVA tenderness, left CVA tenderness, guarding or rebound.  Musculoskeletal:     Cervical back: Normal range of motion and neck supple.  Skin:    General: Skin is warm and dry.  Neurological:     Mental Status: She is alert and oriented to person, place, and time.  Psychiatric:        Behavior: Behavior normal.        Judgment: Judgment normal.      UC Treatments / Results  Labs (all labs ordered are listed, but only abnormal results are displayed) Labs Reviewed  POCT URINALYSIS DIP (MANUAL ENTRY) - Abnormal; Notable for the following components:      Result Value   Clarity, UA cloudy (*)    Glucose, UA =500 (*)    Blood, UA large (*)    Protein Ur, POC =100 (*)    Leukocytes, UA Large (3+) (*)    All other components within normal limits  POCT FASTING CBG [facility] MANUAL ENTRY - Abnormal; Notable for the following components:   POCT Glucose ([facility]) 348 (*)    All other components within normal limits  NOVEL CORONAVIRUS, NAA   Narrative:    Performed at:  [address] [place], [place]  [id number] Lab Director: Rush Farmer MD, Phone:  [phone]  SARS-COV-2, NAA 2 DAY TAT   Narrative:    Performed at:  [address] [place] [address] [place], [place]  [id number] Lab Director: Rush Farmer MD, Phone:  [phone]  URINE CULTURE    EKG   Radiology No results found.  Procedures Procedures (including critical care time)  Medications Ordered in UC Medications - No data to display  Initial Impression / Assessment and Plan / UC Course  I have reviewed the triage vital signs and the nursing notes.  Pertinent labs & imaging results that were available during my care of the patient were reviewed by me and considered in my medical decision making (see chart for details).    Patient afebrile, nontoxic in appearance.  Abdomen soft, + BS, nontender.  Lungs clear to auscultation bilaterally without adventitious lung sounds.  CBG nonfasting 348.  Urine with  500 glucose, no ketones.  Urine dip showed leukocytes, blood, will treat for UTI with Keflex and send for culture.  Also swab for Covid per patient's request, to quarantine until testing results return.  Push fluids.  Return precautions given.  Patient expresses understanding and agrees to plan.  Final Clinical Impressions(s) / UC Diagnoses   Final diagnoses:  Chills  Cystitis  Type 2 diabetes mellitus with hyperglycemia, without long-term current use of insulin [facility])   ED Prescriptions    Medication Sig Dispense Auth. Provider   cephALEXin (KEFLEX) 500 MG capsule Take 1 capsule (500 mg total) by mouth 2 (two) times daily. 14 capsule Ok Edwards, PA-C     PDMP not reviewed this encounter.   Ok Edwards, PA-C 01/05/20 [phone]

## 2020-01-04 NOTE — ED Notes (Signed)
Patient went to [facility] location

## 2020-01-04 NOTE — ED Triage Notes (Signed)
Patient presents with c/o chills, loss of appetite and she has noticed her BS being elevated x 5 days.  She also notes urine is dark and cloudy x two weeks. She denies dysuria or abdominal discomfort.

## 2020-01-05 LAB — NOVEL CORONAVIRUS, NAA: SARS-CoV-2, NAA: NOT DETECTED

## 2020-01-05 LAB — SARS-COV-2, NAA 2 DAY TAT

## 2020-01-08 LAB — URINE CULTURE: Culture: >=100000 — AB

## 2020-07-10 ENCOUNTER — Other Ambulatory Visit: Payer: Self-pay

## 2020-07-10 ENCOUNTER — Ambulatory Visit
Admission: RE | Admit: 2020-07-10 | Discharge: 2020-07-10 | Disposition: A | Discharge status: Home / Self Care | Payer: 59 | Source: Ambulatory Visit | Attending: Emergency Medicine | Admitting: Emergency Medicine

## 2020-07-10 VITALS — BP 167/82 | HR 68 | Temp 98.9°F | Resp 20

## 2020-07-10 DIAGNOSIS — M461 Sacroiliitis, not elsewhere classified: Secondary | ICD-10-CM | POA: Insufficient documentation

## 2020-07-10 DIAGNOSIS — N3001 Acute cystitis with hematuria: Secondary | ICD-10-CM | POA: Diagnosis not present

## 2020-07-10 LAB — POCT URINALYSIS DIP (MANUAL ENTRY)
Glucose, UA: NEGATIVE mg/dL
Ketones, POC UA: NEGATIVE mg/dL
Nitrite, UA: NEGATIVE
Protein Ur, POC: 30 mg/dL — AB
Spec Grav, UA: 1.01 (ref 1.010–1.025)
Urobilinogen, UA: 0.2 E.U./dL
pH, UA: 7.5 (ref 5.0–8.0)

## 2020-07-10 MED ORDER — CEPHALEXIN 500 MG PO CAPS
500.0000 mg | ORAL_CAPSULE | Freq: Two times a day (BID) | ORAL | 0 refills | Status: AC
Start: 2020-07-10 — End: 2020-07-15

## 2020-07-10 NOTE — Discharge Instructions (Addendum)
Take antibiotic twice daily with food. °Important to drink plenty of water throughout the day. °May take Azo as needed for burning sensation. °Return for worsening urinary symptoms, blood in urine, abdominal or back pain, fever. °

## 2020-07-10 NOTE — ED Triage Notes (Signed)
Pt c/o dark urine, urine smells like boiled eggs, and urgency/frequency x4 weeks. Pt c/o pain in "butt cheeks" while sitting.

## 2020-07-10 NOTE — ED Provider Notes (Signed)
[facility]    CSN: [id number] Arrival date & time: 07/10/20  1450      History   Chief Complaint Chief Complaint  Patient presents with  . Urinary Tract Infection  . appt 3    HPI Kaitlyn Cervantes is a 59 y.o. female  History as below presenting for concern of UTI.  Endorsing history thereof: Feels similar.  Endorsing dark urine with malodor, urgency and frequency.  States is been going on for the last few weeks.  Patient also notes low bilateral back pain that is towards her sacral area.  Denying rectal or anal tenderness, change in bowel habit.  Past Medical History:  Diagnosis Date  . Anemia 2009  . Blood transfusion without reported diagnosis 2009  . Breast cancer of upper-inner quadrant of right female breast ([facility]) 10/19/2015   BRCA I & II negative  . Diabetes mellitus without complication ([facility])   . Gallstones    has not had removed  . History of chicken pox   . History of stomach ulcers    treated   . Hypertension   . Kidney stones   . Sleep apnea 2005  . UTI (urinary tract infection)     Patient Active Problem List   Diagnosis Date Noted  . Congestive heart failure ([facility]) 08/01/2016  . 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/19/2015  . Kidney stone 09/19/2014  . Hyperlipidemia 10/30/2013  . Type 2 diabetes mellitus without complication, without long-term current use of insulin ([facility]) 10/29/2013  . Essential hypertension, benign 10/29/2013  . History of obstructive sleep apnea 10/29/2013    Past Surgical History:  Procedure Laterality Date  . BREAST BIOPSY    . CESAREAN SECTION     x3  . kidney stones  2007  . right mastectomy     07/2016  . SPLENECTOMY     age 75     OB History    Gravida  3   Para  3   Term      Preterm      AB      Living  3     SAB      IAB      Ectopic      Multiple      Live Births               Home Medications    Prior to Admission medications    Medication Sig Start Date End Date Taking? Authorizing Provider  cephALEXin (KEFLEX) 500 MG capsule Take 1 capsule (500 mg total) by mouth 2 (two) times daily for 5 days. 07/10/20 07/15/20 Yes Hall-Potvin, [place], PA-C  aspirin EC 81 MG tablet Take 81 mg by mouth daily.    [provider]  Blood Glucose Monitoring Suppl (ONETOUCH VERIO) w/Device KIT 1 kit by Does not apply route every morning. 06/13/17   Beaulah Dinning, MD  bumetanide (BUMEX) 1 MG tablet Take 1 mg by mouth daily.  07/15/16   [provider]  Capsaicin 0.075 % GEL Apply to affected area 3-4 times daily Patient not taking: Reported on 07/31/2019 10/25/17   Mayo, Allyn Kenner, MD  carvedilol (COREG) 6.25 MG tablet Take 6.25 mg by mouth 2 (two) times daily with a meal.  01/01/16   [provider]  Cholecalciferol (VITAMIN D3) 5000 units TABS Take 5,000 Units by mouth daily.    [provider]  Coenzyme Q10 (COQ10) 100 MG CAPS Take 1 capsule  by mouth daily.    [provider]  Garlic 0258 MG CAPS Take 1 capsule by mouth daily.    [provider]  glucose blood (ONETOUCH VERIO) test strip Check blood sugar 3 x daily 09/13/17   Carlyle Dolly, MD  Lancet Devices (ONE TOUCH DELICA LANCING DEV) MISC 1 application by Does not apply route every morning. 09/13/17   Carlyle Dolly, MD  lidocaine-prilocaine (EMLA) cream Apply thin layer to skin as needed before treatment 05/11/16   [provider]  lisinopril (ZESTRIL) 40 MG tablet Take 1 tablet (40 mg total) by mouth daily. 11/27/18   Daisy Floro, DO  metFORMIN (GLUCOPHAGE) 1000 MG tablet Take 1 tablet (1,000 mg total) by mouth 2 (two) times daily with a meal. 07/18/19   Orma Flaming, MD  Multiple Vitamin (MULTIVITAMIN WITH MINERALS) TABS tablet Take 1 tablet by mouth daily.    [provider]  mupirocin ointment (BACTROBAN) 2 % Place 1 application into the nose 2 (two) times daily. Patient not taking: Reported  on 07/31/2019 02/08/19   Gladys Damme, MD  [facility] DELICA LANCETS FINE MISC 1 application by Does not apply route 2 (two) times daily. 06/14/17   Carlyle Dolly, MD  rosuvastatin (CRESTOR) 10 MG tablet Take 1 tablet (10 mg total) by mouth daily. Patient not taking: Reported on 07/03/2019 09/05/17   Carlyle Dolly, MD  spironolactone (ALDACTONE) 25 MG tablet Take 25 mg by mouth daily.  01/01/16   [provider]    Family History Family History  Problem Relation Age of Onset  . Breast cancer Mother        x2 76's & 48's  . Cancer Mother   . Diabetes Mother   . Hypertension Mother   . Heart disease Father   . Early death Father   . Cancer Sister   . Cancer Maternal Aunt   . Breast cancer Maternal Aunt        mid 65's  . Diabetes Brother   . Depression Brother   . Drug abuse Brother   . Mental illness Brother   . Colon polyps Neg Hx   . Colon cancer Neg Hx   . Esophageal cancer Neg Hx   . Rectal cancer Neg Hx   . Stomach cancer Neg Hx     Social History Social History   Tobacco Use  . Smoking status: Never Smoker  . Smokeless tobacco: Never Used  Vaping Use  . Vaping Use: Never used  Substance Use Topics  . Alcohol use: No    Comment: very rare  . Drug use: No     Allergies   Iohexol and Other   Review of Systems Review of Systems   Physical Exam Triage Vital Signs ED Triage Vitals  Enc Vitals Group     BP 07/10/20 1533 (!) 167/82     Pulse Rate 07/10/20 1533 68     Resp 07/10/20 1533 20     Temp 07/10/20 1533 98.9 F (37.2 C)     Temp Source 07/10/20 1533 Oral     SpO2 07/10/20 1533 96 %     Weight --      Height --      Head Circumference --      Peak Flow --      Pain Score 07/10/20 1534 3     Pain Loc --      Pain Edu? --      Excl. in [place]? --  No data found.  Updated Vital Signs BP (!) 167/82 (BP Location: Left Arm)   Pulse 68   Temp 98.9 F (37.2 C) (Oral)   Resp 20   SpO2 96%   Visual Acuity Right Eye  Distance:   Left Eye Distance:   Bilateral Distance:    Right Eye Near:   Left Eye Near:    Bilateral Near:     Physical Exam Constitutional:      General: She is not in acute distress. HENT:     Head: Normocephalic and atraumatic.  Eyes:     General: No scleral icterus.    Pupils: Pupils are equal, round, and reactive to light.  Cardiovascular:     Rate and Rhythm: Normal rate.  Pulmonary:     Effort: Pulmonary effort is normal.  Abdominal:     General: Bowel sounds are normal.     Palpations: Abdomen is soft.     Tenderness: There is no abdominal tenderness. There is no right CVA tenderness, left CVA tenderness or guarding.  Musculoskeletal:        General: Tenderness present. No swelling. Normal range of motion.     Comments: Lateral SI tenderness.  Skin:    Coloration: Skin is not jaundiced or pale.  Neurological:     Mental Status: She is alert and oriented to person, place, and time.      UC Treatments / Results  Labs (all labs ordered are listed, but only abnormal results are displayed) Labs Reviewed  POCT URINALYSIS DIP (MANUAL ENTRY) - Abnormal; Notable for the following components:      Result Value   Clarity, UA cloudy (*)    Bilirubin, UA small (*)    Blood, UA large (*)    Protein Ur, POC =30 (*)    Leukocytes, UA Large (3+) (*)    All other components within normal limits  URINE CULTURE    EKG   Radiology No results found.  Procedures Procedures (including critical care time)  Medications Ordered in UC Medications - No data to display  Initial Impression / Assessment and Plan / UC Course  I have reviewed the triage vital signs and the nursing notes.  Pertinent labs & imaging results that were available during my care of the patient were reviewed by me and considered in my medical decision making (see chart for details).     Urine as above, culture pending.  Per chart review, patient had urine culture that was sensitive to all but  Macrobid.  Will start Keflex, treat low back pain supportively as it is likely MSK, and follow-up with [facility] for possible urology and Ortho referrals.  Return precautions discussed, pt verbalized understanding and is agreeable to plan. Final Clinical Impressions(s) / UC Diagnoses   Final diagnoses:  Acute cystitis with hematuria  SI (sacroiliac) joint inflammation ([place])     Discharge Instructions     Take antibiotic twice daily with food. Important to drink plenty of water throughout the day. May take Azo as needed for burning sensation. Return for worsening urinary symptoms, blood in urine, abdominal or back pain, fever.    ED Prescriptions    Medication Sig Dispense Auth. Provider   cephALEXin (KEFLEX) 500 MG capsule Take 1 capsule (500 mg total) by mouth 2 (two) times daily for 5 days. 10 capsule Hall-Potvin, [place], PA-C     PDMP not reviewed this encounter.   Hall-Potvin, [place], [place] 07/10/20 1617

## 2020-07-14 LAB — URINE CULTURE: Culture: 40000 — AB

## 2020-08-31 ENCOUNTER — Other Ambulatory Visit: Payer: Self-pay

## 2020-08-31 ENCOUNTER — Telehealth: Payer: Self-pay | Admitting: General Practice

## 2020-08-31 ENCOUNTER — Telehealth (INDEPENDENT_AMBULATORY_CARE_PROVIDER_SITE_OTHER): Payer: BLUE CROSS/BLUE SHIELD | Admitting: Family

## 2020-08-31 ENCOUNTER — Encounter: Payer: Self-pay | Admitting: Family

## 2020-08-31 VITALS — BP 128/76 | HR 83 | Ht 64.76 in | Wt 215.6 lb

## 2020-08-31 DIAGNOSIS — E119 Type 2 diabetes mellitus without complications: Secondary | ICD-10-CM | POA: Diagnosis not present

## 2020-08-31 DIAGNOSIS — N3000 Acute cystitis without hematuria: Secondary | ICD-10-CM

## 2020-08-31 DIAGNOSIS — R350 Frequency of micturition: Secondary | ICD-10-CM

## 2020-08-31 DIAGNOSIS — R8290 Unspecified abnormal findings in urine: Secondary | ICD-10-CM

## 2020-08-31 DIAGNOSIS — Z853 Personal history of malignant neoplasm of breast: Secondary | ICD-10-CM

## 2020-08-31 DIAGNOSIS — Z7689 Persons encountering health services in other specified circumstances: Secondary | ICD-10-CM

## 2020-08-31 DIAGNOSIS — K439 Ventral hernia without obstruction or gangrene: Secondary | ICD-10-CM

## 2020-08-31 LAB — POCT URINALYSIS DIP (CLINITEK)
Bilirubin, UA: NEGATIVE
Glucose, UA: NEGATIVE mg/dL
Ketones, POC UA: NEGATIVE mg/dL
Nitrite, UA: NEGATIVE
POC PROTEIN,UA: 30 — AB
Spec Grav, UA: 1.02 (ref 1.010–1.025)
Urobilinogen, UA: 1 E.U./dL
pH, UA: 7 (ref 5.0–8.0)

## 2020-08-31 LAB — POCT GLYCOSYLATED HEMOGLOBIN (HGB A1C): Hemoglobin A1C: 8.6 % — AB (ref 4.0–5.6)

## 2020-08-31 MED ORDER — DAPAGLIFLOZIN PROPANEDIOL 5 MG PO TABS: 5.0000 mg | ORAL_TABLET | Freq: Every day | ORAL | 0 refills | Status: DC

## 2020-08-31 NOTE — Telephone Encounter (Signed)
Pt is requesting a referral for Gastro. And medication refill for the Metformin(GLUCOPHAGE) 1000 MG tablet sent to [facility] on [address]. Please advise and thank you.

## 2020-08-31 NOTE — Progress Notes (Signed)
Patient ID: Kaitlyn Cervantes, female    DOB: 01/25/1961  MRN: [id number]  CC: Establish Care   Subjective: Kaitlyn Cervantes is a 60 year-old female who presents to establish care. PMH significant for congestive heart failure, essential hypertension, type 2 diabetes mellitus without complication without long-term current use of insulin, kidney stone, history of obstructive sleep apnea, hyperlipidemia, and malignant neoplasm of upper-inner quadrant of right female breast.    Her concerns today include:  States while watching television she saw on the news that Metformin can cause cancer and gangrene in the groin therefore she quit taking the medication for some time. Says she is ready to begin taking the medication again.   States she also heard that Rosuvastatin causes cancer so she quit taking the medication. Does not practice a low-fat diet.  Expresses concerns of taking medications which potentially cause cancer because of her history of breast cancer. Completed chemotherapy about 5 years ago.   1. DIABETES TYPE 2 FOLLOW-UP: Last A1C: 8.6% on 08/31/2020 Med Adherence:  $RemoveBef'[]'OqbXUkICpN$  Yes    '[x]'$  No, has not taken Metformin for more than 6 months Medication side effects:  $RemoveB'[]'QiJtXXaP$  Yes    '[x]'$  No Home Monitoring?  $RemoveBefo'[x]'atsUTUpUNGx$  Yes    '[]'$  No Home glucose results range:  145-175 morning, also reports taking Chromium mineral to help with diabetes management Diet Adherence: $RemoveBeforeD'[]'mbppVFuFpynVVT$  Yes    '[x]'$  No Exercise: $RemoveBefo'[]'UGDWZWdFLRv$  Yes    '[x]'$  No but plans to start walking 60 minutes x 3 days weekly Numbness of the feet? $RemoveB'[x]'JyzgQTlU$  Yes, sometimes especially the toes and balls of feet  Retinopathy hx? $RemoveBeforeD'[]'AuFJZuRhDadBbv$  Yes    '[x]'$  No Last eye exam: Has appointment scheduled this week.   2. URINARY SYMPTOMS: Reports frequent urination with odor and cloudy dark brown urine. Reports recently she has been treated multiple times for urinary tract infection with the same antibiotic and has not helped.   3. BUTTOCKS NUMBNESS: Occurring for about 2 months and almost everyday.  Reports it feels like she is sitting on a pillow but she isn't. Described as feeling numb near sacrum and bilateral buttocks and going down the back of the thighs. Endorses normal bowel movements. Says she would like to see an Oncologist because she is concerned about a possible cancerous growth especially after seeing the news story about Metformin and Rosuvastatin causing cancer. Also, concerned about cancerous growth because of her history of breast cancer.    4. PROTRUDING ABDOMEN: Concern for protruding abdomen especially when laying flat and then while raising up can see the protrusion. Does not cause any pain or discomfort.    Patient Active Problem List   Diagnosis Date Noted  . History of breast cancer 12/15/2017  . Congestive heart failure ([place]) 08/01/2016  . Carcinoma of upper-inner quadrant of right breast in female, estrogen receptor positive ([place]) 06/07/2016  . 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 Genetic counseling and testing 11/04/2015  . 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 Kidney stone 09/19/2014  . Hyperlipidemia 10/30/2013  . Type 2 diabetes mellitus without complication, without long-term current use of insulin ([place]) 10/29/2013  . Essential hypertension, benign 10/29/2013  . History of obstructive sleep apnea 10/29/2013     Current Outpatient Medications on File Prior to Visit  Medication Sig Dispense Refill  . aspirin EC 81 MG tablet Take 81 mg by mouth daily.    . Blood Glucose Monitoring Suppl (ONETOUCH VERIO) w/Device  KIT 1 kit by Does not apply route every morning. 1 kit 1  . bumetanide (BUMEX) 1 MG tablet Take 1 mg by mouth daily.     . carvedilol (COREG) 6.25 MG tablet Take 6.25 mg by mouth 2 (two) times daily with a meal.     . Cholecalciferol (VITAMIN D3) 5000 units TABS Take 5,000 Units by mouth daily.    Marland Kitchen glucose blood (ONETOUCH VERIO) test strip Check blood sugar 3 x daily 200 each  3  . Lancet Devices (ONE TOUCH DELICA LANCING DEV) MISC 1 application by Does not apply route every morning. 100 each 1  . lisinopril (ZESTRIL) 40 MG tablet Take 1 tablet (40 mg total) by mouth daily. 30 tablet 0  . Multiple Vitamin (MULTIVITAMIN WITH MINERALS) TABS tablet Take 1 tablet by mouth daily.    Glory Rosebush DELICA LANCETS FINE MISC 1 application by Does not apply route 2 (two) times daily. 100 each 3  . spironolactone (ALDACTONE) 25 MG tablet Take 25 mg by mouth daily.     . Coenzyme Q10 (COQ10) 100 MG CAPS Take 1 capsule by mouth daily. (Patient not taking: Reported on 08/31/2020)    . Garlic 2841 MG CAPS Take 1 capsule by mouth daily. (Patient not taking: Reported on 08/31/2020)    . rosuvastatin (CRESTOR) 10 MG tablet Take 1 tablet (10 mg total) by mouth daily. (Patient not taking: Reported on 07/03/2019) 90 tablet 3   Current Facility-Administered Medications on File Prior to Visit  Medication Dose Route Frequency Provider Last Rate Last Admin  . 0.9 %  sodium chloride infusion  500 mL Intravenous Continuous Ladene Artist, MD        Allergies  Allergen Reactions  . Iohexol Hives    iodine  . Other Itching and Swelling    Nuts excludes peanuts    Social History   Socioeconomic History  . Marital status: Widowed    Spouse name: Not on file  . Number of children: Not on file  . Years of education: Not on file  . Highest education level: Not on file  Occupational History  . Not on file  Tobacco Use  . Smoking status: Never Smoker  . Smokeless tobacco: Never Used  Vaping Use  . Vaping Use: Never used  Substance and Sexual Activity  . Alcohol use: No    Comment: very rare  . Drug use: No  . Sexual activity: Not Currently    Birth control/protection: Post-menopausal    Comment: 1st intercourse- 20, partners- 1, widow  Other Topics Concern  . Not on file  Social History Narrative  . Not on file   Social Determinants of Health   Financial Resource Strain: Not  on file  Food Insecurity: Not on file  Transportation Needs: Not on file  Physical Activity: Not on file  Stress: Not on file  Social Connections: Not on file  Intimate Partner Violence: Not on file    Family History  Problem Relation Age of Onset  . Breast cancer Mother        x2 26's & 41's  . Cancer Mother   . Diabetes Mother   . Hypertension Mother   . Heart disease Father   . Early death Father   . Cancer Sister   . Cancer Maternal Aunt   . Breast cancer Maternal Aunt        mid 56's  . Diabetes Brother   . Depression Brother   . Drug abuse Brother   .  Mental illness Brother   . Colon polyps Neg Hx   . Colon cancer Neg Hx   . Esophageal cancer Neg Hx   . Rectal cancer Neg Hx   . Stomach cancer Neg Hx     Past Surgical History:  Procedure Laterality Date  . BREAST BIOPSY    . CESAREAN SECTION     x3  . kidney stones  2007  . right mastectomy     07/2016  . SPLENECTOMY     age 33     ROS: Review of Systems Negative except as stated above  PHYSICAL EXAM: BP 128/76 (BP Location: Left Arm, Patient Position: Sitting)   Pulse 83   Ht 5' 4.76" (1.645 m)   Wt 215 lb 9.6 oz (97.8 kg)   SpO2 97%   BMI 36.14 kg/m   Physical Exam Constitutional:      Appearance: She is obese.  HENT:     Head: Normocephalic and atraumatic.  Eyes:     Extraocular Movements: Extraocular movements intact.     Pupils: Pupils are equal, round, and reactive to light.  Cardiovascular:     Rate and Rhythm: Normal rate and regular rhythm.     Pulses: Normal pulses.     Heart sounds: Normal heart sounds.  Pulmonary:     Effort: Pulmonary effort is normal.     Breath sounds: Normal breath sounds.  Abdominal:     Hernia: A hernia is present.    Musculoskeletal:     Cervical back: Normal range of motion and neck supple.  Neurological:     General: No focal deficit present.     Mental Status: She is alert and oriented to person, place, and time.  Psychiatric:        Mood and  Affect: Mood normal.        Behavior: Behavior normal.     Results for orders placed or performed in visit on 25/42/70  Basic Metabolic Panel  Result Value Ref Range   Glucose 168 (H) 65 - 99 mg/dL   BUN 21 8 - 27 mg/dL   Creatinine, Ser 1.26 (H) 0.57 - 1.00 mg/dL   GFR calc non Af Amer 46 (L) >59 mL/min/1.73   GFR calc Af Amer 54 (L) >59 mL/min/1.73   BUN/Creatinine Ratio 17 12 - 28   Sodium 138 134 - 144 mmol/L   Potassium 4.4 3.5 - 5.2 mmol/L   Chloride 98 96 - 106 mmol/L   CO2 22 20 - 29 mmol/L   Calcium 9.4 8.7 - 10.3 mg/dL  POCT glycosylated hemoglobin (Hb A1C)  Result Value Ref Range   Hemoglobin A1C 8.6 (A) 4.0 - 5.6 %   HbA1c POC (<> result, manual entry)     HbA1c, POC (prediabetic range)     HbA1c, POC (controlled diabetic range)    POCT URINALYSIS DIP (CLINITEK)  Result Value Ref Range   Color, UA yellow yellow   Clarity, UA cloudy (A) clear   Glucose, UA negative negative mg/dL   Bilirubin, UA negative negative   Ketones, POC UA negative negative mg/dL   Spec Grav, UA 1.020 1.010 - 1.025   Blood, UA large (A) negative   pH, UA 7.0 5.0 - 8.0   POC PROTEIN,UA =30 (A) negative, trace   Urobilinogen, UA 1.0 0.2 or 1.0 E.U./dL   Nitrite, UA Negative Negative   Leukocytes, UA Large (3+) (A) Negative     ASSESSMENT AND PLAN: 1. Encounter to establish care: - Patient  presents today to establish care.  - Return for annual physical examination, labs, and health maintenance. Arrive fasting meaning having had no food and/or nothing to drink for at least 8 hours prior to appointment.  2. Type 2 diabetes mellitus without complication, without long-term current use of insulin ([place]): - Begin taking Metformin again. Prescribed 1000 mg twice daily. - Adding Farxiga 5 mg daily as prescribed.   - Hemoglobin today not at goal at 8.6%, goal < 7%. This is improved from previous hemoglobin A1c of 14.6% on 07/03/2019. - Discussed the importance of healthy eating habits,  low-carbohydrate diet, low-sugar diet, regular aerobic exercise (at least 150 minutes a week as tolerated) and medication compliance to achieve or maintain control of diabetes. - BMP to check kidney function and electrolyte balance.  - Follow-up in 4 weeks or sooner if needed with primary provider for diabetes checkup.  - Basic Metabolic Panel - POCT glycosylated hemoglobin (Hb A1C)  3. Urine frequency: 4. Abnormal urinalysis: - Recently with frequent urinary tract infections.  - Urinalysis today negative for nitrites. Does have large amount of leukocytes. - Sending urine for culture for further evaluation and management. Will call with results.  - POCT URINALYSIS DIP (CLINITEK) - Urine Culture  5. History of breast cancer: - Buttocks and sacrum numbness occurring for about 2 months and almost everyday. Reports it feels like she is sitting on a pillow but she isn't. Described as feeling numb near sacrum and bilateral buttocks and going down the back of the thighs. Denies falls, trauma, and injury. Endorses normal bowel movements. Says she would like to see an Oncologist because she is concerned about a possible cancerous growth especially after seeing the news story about Metformin and Rosuvastatin causing cancer. Also, concerned about cancerous growth because of her history of breast cancer.  - Referral to Oncology for further evaluation and management.  - Ambulatory referral to Oncology  6. Hernia of abdominal wall: - Concern for protruding abdomen especially when laying flat and then while raising up can see the protrusion. Does not cause any pain or discomfort.  - Referral to Gastroenterology for further evaluation and management. - Ambulatory referral to Gastroenterology   Patient was given the opportunity to ask questions.  Patient verbalized understanding of the plan and was able to repeat key elements of the plan. Patient was given clear instructions to go to Emergency Department or  return to medical center if symptoms don't improve, worsen, or new problems develop.The patient verbalized understanding.  Orders Placed This Encounter  Procedures  . Urine Culture  . Basic Metabolic Panel  . Ambulatory referral to Oncology  . Ambulatory referral to Gastroenterology  . POCT glycosylated hemoglobin (Hb A1C)  . POCT URINALYSIS DIP (CLINITEK)    Return in about 4 weeks (around 09/28/2020) for Follow-Up Dr. Juleen China, Physical per patient preference.  Camillia Herter, NP

## 2020-08-31 NOTE — Patient Instructions (Addendum)
Follow-up with Dr. Juleen China in 4 weeks or sooner if needed for diabetes check-up.   Continue Metformin for diabetes.   Adding Farxiga for diabetes.   Referral to Oncology.  Type 2 Diabetes Mellitus, Diagnosis, Adult Type 2 diabetes (type 2 diabetes mellitus) is a long-term disease. It may happen when there is one or both of these problems:  The pancreas does not make enough insulin.  The body does not react in a normal way to insulin that it makes. Insulin lets sugars go into cells in your body. If you have type 2 diabetes, sugars cannot get into your cells. Sugars build up in the blood. This causes high blood sugar. What are the causes? The exact cause of this condition is not known. What increases the risk? The following factors may make you more likely to develop this condition:  Having type 2 diabetes in your family.  Being overweight or very overweight.  Not being active.  Your body not reacting in a normal way to the insulin it makes.  Having higher than normal blood sugar over time.  Having a type of diabetes when you were pregnant.  Having a condition that causes small fluid-filled sacs on your ovaries. What are the signs or symptoms? At first, you may have no symptoms. You will get symptoms slowly. They may include:  More thirst than normal.  More hunger than normal.  Needing to pee more than normal.  Losing weight without trying.  Feeling tired.  Feeling weak.  Seeing things blurry.  Dark patches on your skin. How is this treated? This condition may be treated by a diabetes expert. You may need to:  Follow an eating plan made by a food expert (dietitian).  Get regular exercise.  Find ways to deal with stress.  Check blood sugar as often as told.  Take medicines. Your doctor will set treatment goals for you. Your blood sugar should be at these levels:  Before meals: 80-130 mg/dL (4.4-7.2 mmol/L).  After meals: below 180 mg/dL (10  mmol/L).  Over the last 2-3 months: less than 7%. Follow these instructions at home: Medicines  Take your diabetes medicines or insulin every day.  Take medicines to help you not get other problems caused by this condition. You may need: ? Aspirin. ? Medicine to lower cholesterol. ? Medicine to control blood pressure. Questions to ask your doctor  Should I meet with a diabetes educator?  What medicines do I need, and when should I take them?  What will I need to treat my condition at home?  When should I check my blood sugar?  Where can I find a support group?  Who can I call if I have questions?  When is my next doctor visit? General instructions  Take over-the-counter and prescription medicines only as told by your doctor.  Keep all follow-up visits as told by your doctor. This is important. Where to find more information  American Diabetes Association (ADA): www.diabetes.org  American Association of Diabetes Care and Education Specialists (ADCES): www.diabeteseducator.org  International Diabetes Federation (IDF): MemberVerification.ca Contact a doctor if:  Your blood sugar is at or above 240 mg/dL (13.3 mmol/L) for 2 days in a row.  You have been sick for 2 days or more, and you are not getting better.  You have had a fever for 2 days or more, and you are not getting better.  You have any of these problems for more than 6 hours: ? You cannot eat or drink. ?  You feel like you may vomit. ? You vomit. ? You have watery poop (diarrhea). Get help right away if:  Your blood sugar is very low. This means it is lower than 54 mg/dL (3 mmol/L).  You feel mixed up (confused).  You have trouble thinking clearly.  You have trouble breathing.  You have medium or large ketone levels in your pee. These symptoms may be an emergency. Do not wait to see if the symptoms will go away. Get medical help right away. Call your local emergency services (911 in the [place] Do not drive  yourself to the hospital. Summary  Type 2 diabetes is a long-term disease. Your pancreas may not make enough insulin, or your body may not react in a normal way to insulin that it makes.  This condition is treated with an eating plan, lifestyle changes, and medicines.  Your doctor will set treatment goals for you. These will help you keep your blood sugar in a healthy range.  Keep all follow-up visits as told by your doctor. This is important. This information is not intended to replace advice given to you by your health care provider. Make sure you discuss any questions you have with your health care provider. Document Revised: 01/22/2020 Document Reviewed: 01/22/2020 Elsevier Patient Education  [place].

## 2020-08-31 NOTE — Progress Notes (Signed)
Establish care Urine odor/cloudy dark brown color  Discomfort when sitting down Pt feels like stomach is protruding outward Needs metformin refill Pt wants f/u with Dr. Juleen China

## 2020-09-01 ENCOUNTER — Other Ambulatory Visit: Payer: Self-pay

## 2020-09-01 DIAGNOSIS — E119 Type 2 diabetes mellitus without complications: Secondary | ICD-10-CM

## 2020-09-01 DIAGNOSIS — E1165 Type 2 diabetes mellitus with hyperglycemia: Secondary | ICD-10-CM

## 2020-09-01 DIAGNOSIS — IMO0002 Reserved for concepts with insufficient information to code with codable children: Secondary | ICD-10-CM

## 2020-09-01 LAB — BASIC METABOLIC PANEL
BUN/Creatinine Ratio: 17 (ref 12–28)
BUN: 21 mg/dL (ref 8–27)
CO2: 22 mmol/L (ref 20–29)
Calcium: 9.4 mg/dL (ref 8.7–10.3)
Chloride: 98 mmol/L (ref 96–106)
Creatinine, Ser: 1.26 mg/dL — ABNORMAL HIGH (ref 0.57–1.00)
GFR calc Af Amer: 54 mL/min/{1.73_m2} — ABNORMAL LOW (ref >59)
GFR calc non Af Amer: 46 mL/min/{1.73_m2} — ABNORMAL LOW (ref >59)
Glucose: 168 mg/dL — ABNORMAL HIGH (ref 65–99)
Potassium: 4.4 mmol/L (ref 3.5–5.2)
Sodium: 138 mmol/L (ref 134–144)

## 2020-09-01 MED ORDER — METFORMIN HCL 1000 MG PO TABS: 1000.0000 mg | ORAL_TABLET | Freq: Two times a day (BID) | ORAL | 0 refills | Status: DC

## 2020-09-01 MED ORDER — DAPAGLIFLOZIN PROPANEDIOL 5 MG PO TABS: 5.0000 mg | ORAL_TABLET | Freq: Every day | ORAL | 0 refills | Status: DC

## 2020-09-01 NOTE — Progress Notes (Signed)
farxiga reordered

## 2020-09-01 NOTE — Progress Notes (Signed)
Metformin reordered 

## 2020-09-01 NOTE — Progress Notes (Signed)
Kidney function not 100% will recheck in about 3 months.   Urine culture pending.   Keep appointment with primary provider for diabetes checkup 09/23/2020.

## 2020-09-03 LAB — URINE CULTURE

## 2020-09-04 ENCOUNTER — Telehealth: Payer: Self-pay

## 2020-09-04 MED ORDER — CEFUROXIME AXETIL 250 MG PO TABS: 250.0000 mg | ORAL_TABLET | Freq: Two times a day (BID) | ORAL | 0 refills | Status: DC

## 2020-09-04 NOTE — Telephone Encounter (Signed)
Att to contact pt to advise of lab results, no ans lvm

## 2020-09-04 NOTE — Progress Notes (Signed)
Please call patient with update.   Cefuroxime (Ceftin) 250 mg twice daily for 5 days for urinary tract infection.

## 2020-09-04 NOTE — Telephone Encounter (Signed)
-----   Message from Camillia Herter, NP sent at 09/04/2020  1:30 PM EST ----- Please call patient with update.   Cefuroxime (Ceftin) 250 mg twice daily for 5 days for urinary tract infection.

## 2020-09-04 NOTE — Addendum Note (Signed)
Addended by: Camillia Herter on: 09/04/2020 01:28 PM   Modules accepted: Orders

## 2020-09-07 ENCOUNTER — Telehealth: Payer: Self-pay | Admitting: General Practice

## 2020-09-07 NOTE — Telephone Encounter (Signed)
PT returning nurse's call- asking for latest results. Pt requests to have message left on machine because pt is at work. Please advise and thank you

## 2020-09-07 NOTE — Telephone Encounter (Signed)
Att to return pt call, someone picked up phone and hung up

## 2020-09-14 ENCOUNTER — Other Ambulatory Visit: Payer: Self-pay

## 2020-09-14 DIAGNOSIS — N3000 Acute cystitis without hematuria: Secondary | ICD-10-CM

## 2020-09-14 MED ORDER — CEFUROXIME AXETIL 250 MG PO TABS: 250.0000 mg | ORAL_TABLET | Freq: Two times a day (BID) | ORAL | 0 refills | Status: AC

## 2020-09-23 ENCOUNTER — Ambulatory Visit: Payer: BLUE CROSS/BLUE SHIELD | Admitting: Internal Medicine

## 2020-10-22 ENCOUNTER — Ambulatory Visit: Payer: BLUE CROSS/BLUE SHIELD | Admitting: Gastroenterology

## 2020-10-22 ENCOUNTER — Other Ambulatory Visit: Payer: Self-pay

## 2020-10-22 ENCOUNTER — Ambulatory Visit
Admission: EM | Admit: 2020-10-22 | Discharge: 2020-10-22 | Disposition: A | Discharge status: Home / Self Care | Payer: BLUE CROSS/BLUE SHIELD | Attending: Family Medicine | Admitting: Family Medicine

## 2020-10-22 DIAGNOSIS — L089 Local infection of the skin and subcutaneous tissue, unspecified: Secondary | ICD-10-CM

## 2020-10-22 DIAGNOSIS — S80862S Insect bite (nonvenomous), left lower leg, sequela: Secondary | ICD-10-CM

## 2020-10-22 DIAGNOSIS — W57XXXS Bitten or stung by nonvenomous insect and other nonvenomous arthropods, sequela: Secondary | ICD-10-CM

## 2020-10-22 DIAGNOSIS — T148XXA Other injury of unspecified body region, initial encounter: Secondary | ICD-10-CM | POA: Diagnosis not present

## 2020-10-22 MED ORDER — DOXYCYCLINE HYCLATE 100 MG PO CAPS
100.0000 mg | ORAL_CAPSULE | Freq: Two times a day (BID) | ORAL | 0 refills | Status: AC
Start: 2020-10-22 — End: 2020-10-29

## 2020-10-22 NOTE — ED Provider Notes (Addendum)
[facility]    CSN: [id number] Arrival date & time: 10/22/20  1923      History   Chief Complaint Chief Complaint  Patient presents with  . Bump on Leg    HPI Kaitlyn Cervantes is a 60 y.o. female.   HPI Patient presents with a suspected insect bite to the right lower leg.  She reports noticing the suspected bite wound 4 days ago.  She reports that she has had some swelling around the area, chills and today checked her temperature and had a low-grade fever of 99.8.  She had been working out in the yard and was concerned that something may have bitten her.  She is a type II diabetic and is concerned for secondary infection. Past Medical History:  Diagnosis Date  . Anemia 2009  . Blood transfusion without reported diagnosis 2009  . Breast cancer of upper-inner quadrant of right female breast ([place]) 10/19/2015   BRCA I & II negative  . Diabetes mellitus without complication ([place])   . Gallstones    has not had removed  . History of chicken pox   . History of stomach ulcers    treated   . Hypertension   . Kidney stones   . Sleep apnea 2005  . UTI (urinary tract infection)     Patient Active Problem List   Diagnosis Date Noted  . History of breast cancer 12/15/2017  . Congestive heart failure ([place]) 08/01/2016  . Carcinoma of upper-inner quadrant of right breast in female, estrogen receptor positive ([place]) 06/07/2016  . 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 Genetic counseling and testing 11/04/2015  . Malignant neoplasm of upper-inner quadrant of right female breast ([place]) 10/19/2015  . Kidney stone 09/19/2014  . Hyperlipidemia 10/30/2013  . Type 2 diabetes mellitus without complication, without long-term current use of insulin ([place]) 10/29/2013  . Essential hypertension, benign 10/29/2013  . History of obstructive sleep apnea 10/29/2013    Past Surgical History:  Procedure Laterality Date  . BREAST BIOPSY    . CESAREAN SECTION     x3   . kidney stones  2007  . right mastectomy     07/2016  . SPLENECTOMY     age 89     OB History    Gravida  3   Para  3   Term      Preterm      AB      Living  3     SAB      IAB      Ectopic      Multiple      Live Births               Home Medications    Prior to Admission medications   Medication Sig Start Date End Date Taking? Authorizing Provider  aspirin EC 81 MG tablet Take 81 mg by mouth daily.   Yes [provider]  bumetanide (BUMEX) 1 MG tablet Take 1 mg by mouth daily.  07/15/16  Yes [provider]  carvedilol (COREG) 6.25 MG tablet Take 6.25 mg by mouth 2 (two) times daily with a meal.  01/01/16  Yes [provider]  Cholecalciferol (VITAMIN D3) 5000 units TABS Take 5,000 Units by mouth daily.   Yes [provider]  dapagliflozin propanediol (FARXIGA) 5 MG TABS tablet Take 1 tablet (5 mg total) by mouth daily before breakfast. 09/01/20  Yes Camillia Herter, NP  doxycycline (VIBRAMYCIN)  100 MG capsule Take 1 capsule (100 mg total) by mouth 2 (two) times daily for 7 days. 10/22/20 10/29/20 Yes Scot Jun, FNP  lisinopril (ZESTRIL) 40 MG tablet Take 1 tablet (40 mg total) by mouth daily. 11/27/18  Yes Milus Banister C, DO  metFORMIN (GLUCOPHAGE) 1000 MG tablet Take 1 tablet (1,000 mg total) by mouth 2 (two) times daily with a meal. 09/01/20  Yes Camillia Herter, NP  Multiple Vitamin (MULTIVITAMIN WITH MINERALS) TABS tablet Take 1 tablet by mouth daily.   Yes [provider]  spironolactone (ALDACTONE) 25 MG tablet Take 25 mg by mouth daily.  01/01/16  Yes [provider]  Blood Glucose Monitoring Suppl (ONETOUCH VERIO) w/Device KIT 1 kit by Does not apply route every morning. 06/13/17   Carlyle Dolly, MD  Coenzyme Q10 (COQ10) 100 MG CAPS Take 1 capsule by mouth daily. Patient not taking: Reported on 08/31/2020    [provider]  Garlic 6767 MG CAPS Take 1 capsule by mouth  daily. Patient not taking: Reported on 08/31/2020    [provider]  glucose blood (ONETOUCH VERIO) test strip Check blood sugar 3 x daily 09/13/17   Carlyle Dolly, MD  Lancet Devices (ONE TOUCH DELICA LANCING DEV) MISC 1 application by Does not apply route every morning. 09/13/17   Carlyle Dolly, MD  [facility] DELICA LANCETS FINE MISC 1 application by Does not apply route 2 (two) times daily. 06/14/17   Carlyle Dolly, MD  rosuvastatin (CRESTOR) 10 MG tablet Take 1 tablet (10 mg total) by mouth daily. Patient not taking: Reported on 07/03/2019 09/05/17   Carlyle Dolly, MD    Family History Family History  Problem Relation Age of Onset  . Breast cancer Mother        x2 82's & 68's  . Cancer Mother   . Diabetes Mother   . Hypertension Mother   . Heart disease Father   . Early death Father   . Cancer Sister   . Cancer Maternal Aunt   . Breast cancer Maternal Aunt        mid 3's  . Diabetes Brother   . Depression Brother   . Drug abuse Brother   . Mental illness Brother   . Colon polyps Neg Hx   . Colon cancer Neg Hx   . Esophageal cancer Neg Hx   . Rectal cancer Neg Hx   . Stomach cancer Neg Hx     Social History Social History   Tobacco Use  . Smoking status: Never Smoker  . Smokeless tobacco: Never Used  Vaping Use  . Vaping Use: Never used  Substance Use Topics  . Alcohol use: No    Comment: very rare  . Drug use: No     Allergies   Iohexol and Other   Review of Systems Review of Systems Pertinent negatives listed in HPI   Physical Exam Triage Vital Signs ED Triage Vitals  Enc Vitals Group     BP 10/22/20 2006 (!) 153/71     Pulse Rate 10/22/20 2006 79     Resp 10/22/20 2006 18     Temp 10/22/20 2006 99.1 F (37.3 C)     Temp src --      SpO2 10/22/20 2006 98 %     Weight --      Height --      Head Circumference --      Peak Flow --  Pain Score 10/22/20 2004 0     Pain Loc --      Pain Edu? --      Excl.  in [place]? --    No data found.  Updated Vital Signs BP (!) 153/71   Pulse 79   Temp 99.1 F (37.3 C)   Resp 18   SpO2 98%   Visual Acuity Right Eye Distance:   Left Eye Distance:   Bilateral Distance:    Right Eye Near:   Left Eye Near:    Bilateral Near:     Physical Exam General appearance: alert, well developed, well nourished, cooperative Head: Normocephalic, without obvious abnormality, atraumatic Respiratory: Respirations even and unlabored, normal respiratory rate Heart: rate and rhythm normal. No gallop or murmurs noted on exam  Extremities: Right lower leg mid/lateral, circular erythematous macular papular rash no induration, increased warmth to touch Psych: Appropriate mood and affect  UC Treatments / Results  Labs (all labs ordered are listed, but only abnormal results are displayed) Labs Reviewed - No data to display  EKG   Radiology No results found.  Procedures Procedures (including critical care time)  Medications Ordered in UC Medications - No data to display  Initial Impression / Assessment and Plan / UC Course  I have reviewed the triage vital signs and the nursing notes.  Pertinent labs & imaging results that were available during my care of the patient were reviewed by me and considered in my medical decision making (see chart for details).     Insect bite wound involving the left lower leg with secondary infection. Treatment with doxycycline 100 mg twice daily for 7 days. Return precautions given.  Final Clinical Impressions(s) / UC Diagnoses   Final diagnoses:  Infected bite wound  Insect bite of left lower leg, sequela   Discharge Instructions   None    ED Prescriptions    Medication Sig Dispense Auth. Provider   doxycycline (VIBRAMYCIN) 100 MG capsule Take 1 capsule (100 mg total) by mouth 2 (two) times daily for 7 days. 14 capsule Scot Jun, FNP     PDMP not reviewed this encounter.   Scot Jun,  FNP 10/26/20 2037    Scot Jun, [place] 10/26/20 2038

## 2020-10-22 NOTE — ED Triage Notes (Addendum)
Pt with bump on front of right lower leg which she noticed Sunday morning. Some swelling noted around area with central spot of discoloration. Pt reports having chills and temp 99.8 at home. States noticed the bump after being out in the yard working.

## 2020-10-28 ENCOUNTER — Encounter: Payer: Self-pay | Admitting: Internal Medicine

## 2020-10-28 ENCOUNTER — Ambulatory Visit (INDEPENDENT_AMBULATORY_CARE_PROVIDER_SITE_OTHER): Payer: BLUE CROSS/BLUE SHIELD | Admitting: Internal Medicine

## 2020-10-28 ENCOUNTER — Other Ambulatory Visit: Payer: Self-pay

## 2020-10-28 VITALS — BP 160/82 | HR 79 | Temp 97.3°F | Resp 16 | Ht 64.5 in | Wt 212.0 lb

## 2020-10-28 DIAGNOSIS — Z853 Personal history of malignant neoplasm of breast: Secondary | ICD-10-CM | POA: Diagnosis not present

## 2020-10-28 DIAGNOSIS — K6289 Other specified diseases of anus and rectum: Secondary | ICD-10-CM | POA: Diagnosis not present

## 2020-10-28 DIAGNOSIS — E119 Type 2 diabetes mellitus without complications: Secondary | ICD-10-CM | POA: Diagnosis not present

## 2020-10-28 LAB — POCT GLYCOSYLATED HEMOGLOBIN (HGB A1C): HbA1c, POC (controlled diabetic range): 8.3 % — AB (ref 0.0–7.0)

## 2020-10-28 LAB — GLUCOSE, POCT (MANUAL RESULT ENTRY): POC Glucose: 183 mg/dl — AB (ref 70–99)

## 2020-10-28 NOTE — Progress Notes (Signed)
Concerns with discomfort on buttocks x 2 months  Denies broken skin  But has burning sensation

## 2020-10-28 NOTE — Progress Notes (Signed)
Subjective:    Kaitlyn Cervantes - 60 y.o. female MRN [id number]  Date of birth: 10-13-1960  HPI  Kaitlyn Cervantes is here for DM f/u. When last seen in Feb 2022 she had not been taking Metformin. Wilder Glade was also added at this time.   Diabetes mellitus, Type 2 Disease Monitoring             Blood Sugar Ranges: Fasting - 130-140s;  Random - 160-180s.             Polyuria: no             Visual problems: no   Urine Microalbumin---on Ace inhibitor   Last A1C: 8.6 (Feb 2022)   Medications: Metformin 1000 mg BID, Farxiga 5 mg  Medication Compliance: no, never started [place] due to concerns about urinary side effects   Medication Side Effects             Hypoglycemia: no   Health Maintenance:  Health Maintenance Due  Topic Date Due  . COVID-19 Vaccine (1) Never done  . OPHTHALMOLOGY EXAM  09/09/2018  . FOOT EXAM  02/01/2020  . PAP SMEAR-Modifier  12/22/2020    -  reports that she has never smoked. She has never used smokeless tobacco. - Review of Systems: Per HPI. - Past Medical History: Patient Active Problem List   Diagnosis Date Noted  . History of breast cancer 12/15/2017  . Congestive heart failure ([place]) 08/01/2016  . Carcinoma of upper-inner quadrant of right breast in female, estrogen receptor positive ([place]) 06/07/2016  . 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 Genetic counseling and testing 11/04/2015  . Malignant neoplasm of upper-inner quadrant of right female breast ([place]) 10/19/2015  . Kidney stone 09/19/2014  . Hyperlipidemia 10/30/2013  . Type 2 diabetes mellitus without complication, without long-term current use of insulin ([place]) 10/29/2013  . Essential hypertension, benign 10/29/2013  . History of obstructive sleep apnea 10/29/2013   - Medications: reviewed and updated   Objective:   Physical Exam BP (!) 160/82   Pulse 79   Temp (!) 97.3 F (36.3 C)   Resp 16   Ht 5' 4.5" (1.638 m)   Wt 212 lb (96.2 kg)   BMI 35.83 kg/m   Physical Exam Constitutional:      General: She is not in acute distress.    Appearance: She is not diaphoretic.  Cardiovascular:     Rate and Rhythm: Normal rate.  Pulmonary:     Effort: Pulmonary effort is normal. No respiratory distress.  Musculoskeletal:        General: Normal range of motion.     Comments: Diabetic Foot Check -  Appearance - no lesions, ulcers or calluses Skin - no unusual pallor or redness Monofilament testing - significantly diminished bilaterally Right - Great toe, medial, central, lateral ball and posterior foot intact Left - Great toe, medial, central, lateral ball and posterior foot intact   Skin:    General: Skin is warm and dry.  Neurological:     Mental Status: She is alert and oriented to person, place, and time.  Psychiatric:        Mood and Affect: Affect normal.        Judgment: Judgment normal.            Assessment & Plan:   1. Type 2 diabetes mellitus without complication, without long-term current use of insulin (HCC) A1c 8.3. Patient has a lot of concerns about medication side  effects for her DM. Discussed that glucose is still not adequately controled and that sole use of Metformin is unlikely to help achieve results. Will need to see Benard Halsted, PharmD to discuss her concerns and to find additional medication she feels comfortable with taking.  - HgB A1c - Glucose (CBG) - [facility] DIABETES FOOT EXAM  2. Rectal pain Saw Durene Fruits NP in Feb for rectal pain. GI referral was placed but patient was unable to establish care. Will re-place referral.  - Ambulatory referral to Gastroenterology  3. History of breast cancer - Ambulatory referral to Hematology / Oncology     Phill Myron, D.O. 10/28/2020, 3:42 PM Primary Care at [facility]

## 2020-11-18 ENCOUNTER — Telehealth: Payer: BLUE CROSS/BLUE SHIELD | Admitting: Pharmacist

## 2020-11-24 ENCOUNTER — Telehealth: Payer: BLUE CROSS/BLUE SHIELD | Admitting: Pharmacist

## 2021-10-29 NOTE — Progress Notes (Addendum)
? ? ?Patient ID: Kaitlyn Cervantes, female    DOB: 1961/04/10  MRN: [id number] ? ?CC: Diabetes Follow-Up ? ?Subjective: ?Kaitlyn Cervantes is a 61 y.o. female who presents for diabetes follow-up.  ? ?Her concerns today include:  ?Diabetes type 2 follow-up: ?10/28/2020 with Phill Myron, DO: ?A1c 8.3. Patient has a lot of concerns about medication side effects for her DM. Discussed that glucose is still not adequately controled and that sole use of Metformin is unlikely to help achieve results. Will need to see Benard Halsted, PharmD to discuss her concerns and to find additional medication she feels comfortable with taking.  ? ?11/03/2021:  ?Med Adherence:  $RemoveBef'[]'CWSwpbMKRY$  Yes  $Re'[x]'hkF$  No, reports taking Metformin twice weekly related side effects of upset stomach.  ?Medication side effects:  $RemoveB'[x]'mSBAudMY$  Yes, upset stomach ?Home Monitoring?  $RemoveBefo'[]'gcsvZkzLAVi$  Yes    '[x]'$  No ?Diet Adherence: $RemoveBeforeDE'[]'rkTpMwcJUIXVaag$  Yes    '[x]'$  No but trying  ?Exercise: $RemoveBef'[]'klvmzgcjwo$  Yes    '[x]'$  No ?Comments: Reports never began taking [place] since initially prescribed 08/31/2020. Says she did her own research and did not like the side effects associated with [place]. Also, reports Phill Myron, DO told her that she didn't need to take [place]. Reports she was taking Glucophage for diabetes in the past and did well overall on the medication without side effects. However, Glucophage no longer covered with her health insurance so had to switch to Metformin. Reports taking over-the-counter natural medication to assist with diabetes. Does not want to add medication in addition to Metformin to assist with diabetes management stating she wants to do her own research.  ? ?2. Left hip pain: ?Persisting. Feels numb. Denies recent injury/trauma and additional red flag symptoms. Denies radiation. Declines beginning medication to assist with discomfort. Wants to know if something more is going on in the hip and want the exact cause is.  ? ?3. Hypertension: ?Followed by Cardiology and plans to follow-up soon. Has not  taken blood pressure medication today as of present usually takes in the evenings.  ? ?4. History kidney stones: ?Requesting referral to kidney doctor for history of kidney stones.  ? ?5. Billing concerns: ?States she is concerned about the cost of today's appointment. Reports in the past with another provider had an annual physical exam and discussed other concerns. Subsequently sent a $300 bill. Counseled patient that today's appointment is for a routine office visit and she can schedule an annual physical exam later when best for her. Patient verbalized understanding and agreement. ? ?Patient Active Problem List  ? Diagnosis Date Noted  ? History of breast cancer 12/15/2017  ? Congestive heart failure ([place]) 08/01/2016  ? Carcinoma of upper-inner quadrant of right breast in female, estrogen receptor positive ([place]) 06/07/2016  ? 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 02/25/2016  ? Genetic counseling and testing 11/04/2015  ? Malignant neoplasm of upper-inner quadrant of right female breast ([place]) 10/19/2015  ? Kidney stone 09/19/2014  ? Hyperlipidemia 10/30/2013  ? Type 2 diabetes mellitus without complication, without long-term current use of insulin ([place]) 10/29/2013  ? Essential hypertension, benign 10/29/2013  ? History of obstructive sleep apnea 10/29/2013  ?  ? ?Current Outpatient Medications on File Prior to Visit  ?Medication Sig Dispense Refill  ? aspirin EC 81 MG tablet Take 81 mg by mouth daily.    ? Blood Glucose Monitoring Suppl (ONETOUCH VERIO) w/Device KIT 1 kit by Does not apply route every morning. 1 kit 1  ? bumetanide (BUMEX) 1 MG tablet Take  1 mg by mouth daily.     ? carvedilol (COREG) 6.25 MG tablet Take 6.25 mg by mouth 2 (two) times daily with a meal.     ? Cholecalciferol (VITAMIN D3) 5000 units TABS Take 5,000 Units by mouth daily.    ? Coenzyme Q10 (COQ10) 100 MG CAPS Take 1 capsule by mouth daily.    ? dapagliflozin propanediol (FARXIGA) 5 MG TABS tablet Take 1 tablet (5 mg total)  by mouth daily before breakfast. 30 tablet 0  ? Garlic 9150 MG CAPS Take 1 capsule by mouth daily.    ? glucose blood (ONETOUCH VERIO) test strip Check blood sugar 3 x daily 200 each 3  ? Lancet Devices (ONE TOUCH DELICA LANCING DEV) MISC 1 application by Does not apply route every morning. 100 each 1  ? lisinopril (ZESTRIL) 40 MG tablet Take 1 tablet (40 mg total) by mouth daily. 30 tablet 0  ? Multiple Vitamin (MULTIVITAMIN WITH MINERALS) TABS tablet Take 1 tablet by mouth daily.    ? ONETOUCH DELICA LANCETS FINE MISC 1 application by Does not apply route 2 (two) times daily. 100 each 3  ? rosuvastatin (CRESTOR) 10 MG tablet Take 1 tablet (10 mg total) by mouth daily. (Patient not taking: Reported on 11/03/2021) 90 tablet 3  ? spironolactone (ALDACTONE) 25 MG tablet Take 25 mg by mouth daily.     ? ?Current Facility-Administered Medications on File Prior to Visit  ?Medication Dose Route Frequency Provider Last Rate Last Admin  ? 0.9 %  sodium chloride infusion  500 mL Intravenous Continuous Ladene Artist, MD      ? ? ?Allergies  ?Allergen Reactions  ? Iohexol Hives  ?  iodine  ? Other Itching and Swelling  ?  Nuts excludes peanuts  ? ? ?Social History  ? ?Socioeconomic History  ? Marital status: Widowed  ?  Spouse name: Not on file  ? Number of children: Not on file  ? Years of education: Not on file  ? Highest education level: Not on file  ?Occupational History  ? Not on file  ?Tobacco Use  ? Smoking status: Never  ? Smokeless tobacco: Never  ?Vaping Use  ? Vaping Use: Never used  ?Substance and Sexual Activity  ? Alcohol use: No  ?  Comment: very rare  ? Drug use: No  ? Sexual activity: Not Currently  ?  Birth control/protection: Post-menopausal  ?  Comment: 1st intercourse- 20, partners- 13, widow  ?Other Topics Concern  ? Not on file  ?Social History Narrative  ? Not on file  ? ?Social Determinants of Health  ? ?Financial Resource Strain: Not on file  ?Food Insecurity: Not on file  ?Transportation Needs: Not  on file  ?Physical Activity: Not on file  ?Stress: Not on file  ?Social Connections: Not on file  ?Intimate Partner Violence: Not on file  ? ? ?Family History  ?Problem Relation Age of Onset  ? Breast cancer Mother   ?     x2 54's & 21's  ? Cancer Mother   ? Diabetes Mother   ? Hypertension Mother   ? Heart disease Father   ? Early death Father   ? Cancer Sister   ? Cancer Maternal Aunt   ? Breast cancer Maternal Aunt   ?     mid 63's  ? Diabetes Brother   ? Depression Brother   ? Drug abuse Brother   ? Mental illness Brother   ? Colon polyps Neg  Hx   ? Colon cancer Neg Hx   ? Esophageal cancer Neg Hx   ? Rectal cancer Neg Hx   ? Stomach cancer Neg Hx   ? ? ?Past Surgical History:  ?Procedure Laterality Date  ? BREAST BIOPSY    ? CESAREAN SECTION    ? x3  ? kidney stones  2007  ? right mastectomy    ? 07/2016  ? SPLENECTOMY    ? age 25   ? ? ?ROS: ?Review of Systems ?Negative except as stated above ? ?PHYSICAL EXAM: ?BP (!) 164/78 (BP Location: Left Arm, Patient Position: Sitting, Cuff Size: Large)   Pulse 79   Temp 98.3 ?F (36.8 ?C)   Resp 18   Ht 5' 4.49" (1.638 m)   Wt 209 lb (94.8 kg)   SpO2 98%   BMI 35.33 kg/m?  ? ?Physical Exam ?HENT:  ?   Head: Normocephalic and atraumatic.  ?Eyes:  ?   Extraocular Movements: Extraocular movements intact.  ?   Conjunctiva/sclera: Conjunctivae normal.  ?   Pupils: Pupils are equal, round, and reactive to light.  ?Cardiovascular:  ?   Rate and Rhythm: Normal rate and regular rhythm.  ?   Pulses: Normal pulses.  ?   Heart sounds: Normal heart sounds.  ?Pulmonary:  ?   Effort: Pulmonary effort is normal.  ?   Breath sounds: Normal breath sounds.  ?Musculoskeletal:  ?   Cervical back: Normal range of motion and neck supple.  ?Neurological:  ?   General: No focal deficit present.  ?   Mental Status: She is alert and oriented to person, place, and time.  ?Psychiatric:     ?   Mood and Affect: Affect is flat.  ? ?Results for orders placed or performed in visit on 11/03/21   ?POCT glycosylated hemoglobin (Hb A1C)  ?Result Value Ref Range  ? Hemoglobin A1C 8.4 (A) 4.0 - 5.6 %  ? HbA1c POC (<> result, manual entry)    ? HbA1c, POC (prediabetic range)    ? HbA1c, POC (controlled diabet

## 2021-11-03 ENCOUNTER — Ambulatory Visit (INDEPENDENT_AMBULATORY_CARE_PROVIDER_SITE_OTHER): Payer: Federal, State, Local not specified - PPO

## 2021-11-03 ENCOUNTER — Ambulatory Visit (INDEPENDENT_AMBULATORY_CARE_PROVIDER_SITE_OTHER): Payer: Federal, State, Local not specified - PPO | Admitting: Family

## 2021-11-03 VITALS — BP 164/78 | HR 79 | Temp 98.3°F | Resp 18 | Ht 64.49 in | Wt 209.0 lb

## 2021-11-03 DIAGNOSIS — M25552 Pain in left hip: Secondary | ICD-10-CM

## 2021-11-03 DIAGNOSIS — I10 Essential (primary) hypertension: Secondary | ICD-10-CM | POA: Diagnosis not present

## 2021-11-03 DIAGNOSIS — I119 Hypertensive heart disease without heart failure: Secondary | ICD-10-CM | POA: Diagnosis not present

## 2021-11-03 DIAGNOSIS — E119 Type 2 diabetes mellitus without complications: Secondary | ICD-10-CM

## 2021-11-03 DIAGNOSIS — Z87442 Personal history of urinary calculi: Secondary | ICD-10-CM

## 2021-11-03 LAB — POCT GLYCOSYLATED HEMOGLOBIN (HGB A1C): Hemoglobin A1C: 8.4 % — AB (ref 4.0–5.6)

## 2021-11-03 MED ORDER — METFORMIN HCL ER 500 MG PO TB24: 1000.0000 mg | ORAL_TABLET | Freq: Two times a day (BID) | ORAL | 0 refills | Status: DC

## 2021-11-03 NOTE — Progress Notes (Signed)
Diabetes discussed in office.

## 2021-11-03 NOTE — Progress Notes (Signed)
Pt presents for left leg pain that's been going on for couple of months, states more of constant numbing feeling more than a pain  ?Need refill on Lisinopril ?

## 2021-11-04 ENCOUNTER — Other Ambulatory Visit: Payer: Self-pay | Admitting: Family

## 2021-11-04 DIAGNOSIS — M76892 Other specified enthesopathies of left lower limb, excluding foot: Secondary | ICD-10-CM

## 2021-11-04 DIAGNOSIS — Z87442 Personal history of urinary calculi: Secondary | ICD-10-CM

## 2021-11-04 DIAGNOSIS — M47819 Spondylosis without myelopathy or radiculopathy, site unspecified: Secondary | ICD-10-CM

## 2021-11-04 DIAGNOSIS — N1831 Chronic kidney disease, stage 3a: Secondary | ICD-10-CM

## 2021-11-04 LAB — BASIC METABOLIC PANEL
BUN/Creatinine Ratio: 13 (ref 12–28)
BUN: 15 mg/dL (ref 8–27)
CO2: 25 mmol/L (ref 20–29)
Calcium: 10 mg/dL (ref 8.7–10.3)
Chloride: 102 mmol/L (ref 96–106)
Creatinine, Ser: 1.19 mg/dL — ABNORMAL HIGH (ref 0.57–1.00)
Glucose: 188 mg/dL — ABNORMAL HIGH (ref 70–99)
Potassium: 4.1 mmol/L (ref 3.5–5.2)
Sodium: 142 mmol/L (ref 134–144)
eGFR: 52 mL/min/{1.73_m2} — ABNORMAL LOW (ref >59)

## 2021-11-04 NOTE — Progress Notes (Signed)
Call patient with update.  ? ?Left hip with spurring. Osteoarthritis lower back. Referral to Orthopedics for further evaluation and management. Expect a call within 2 weeks with appointment details.

## 2021-11-04 NOTE — Progress Notes (Signed)
Call patient with update.  ? ?Kidney function not 100%. Electrolytes normal. Referral to Nephrology for further evaluation and management their office should call within 2 weeks with appointment details.

## 2021-11-18 ENCOUNTER — Telehealth: Payer: Self-pay | Admitting: Family

## 2021-11-18 NOTE — Telephone Encounter (Signed)
Copied from [place] [phone]. Topic: General - Other ?>> Nov 17, 2021  4:43 PM Tessa Lerner A wrote: ?Reason for CRM: The patient would like to speak with a member of staff when possible ? ?The patient would like to review their labs from 11/03/21 ? ?Please contact further when possible ?

## 2021-11-18 NOTE — Telephone Encounter (Signed)
Att to return pt call to advise about labs from 4/26.no ans lvm if pt returns call triage nurse may give labs  ? ?Kidney function not 100%. Electrolytes normal. Referral to Nephrology for further evaluation and management their office should call within 2 weeks with appointment details. ?

## 2021-11-19 NOTE — Telephone Encounter (Signed)
Patient returned call-notified of lab results and referral. Patient states she has not heard for referral as of yet- please follow up for her. ?

## 2021-11-22 NOTE — Telephone Encounter (Signed)
LM on pts VM with the # to the Nephrologist for her to call and schedule. ?

## 2021-12-02 NOTE — Progress Notes (Signed)
Erroneous encounter-disregard

## 2021-12-10 ENCOUNTER — Encounter: Payer: Federal, State, Local not specified - PPO | Admitting: Family

## 2021-12-10 DIAGNOSIS — E119 Type 2 diabetes mellitus without complications: Secondary | ICD-10-CM

## 2022-01-10 ENCOUNTER — Other Ambulatory Visit: Payer: Self-pay

## 2022-01-10 ENCOUNTER — Ambulatory Visit: Payer: Self-pay | Admitting: *Deleted

## 2022-01-10 DIAGNOSIS — Z1231 Encounter for screening mammogram for malignant neoplasm of breast: Secondary | ICD-10-CM | POA: Diagnosis not present

## 2022-01-10 DIAGNOSIS — E119 Type 2 diabetes mellitus without complications: Secondary | ICD-10-CM

## 2022-01-10 DIAGNOSIS — Z9221 Personal history of antineoplastic chemotherapy: Secondary | ICD-10-CM | POA: Diagnosis not present

## 2022-01-10 DIAGNOSIS — Z853 Personal history of malignant neoplasm of breast: Secondary | ICD-10-CM | POA: Diagnosis not present

## 2022-01-10 DIAGNOSIS — Z9011 Acquired absence of right breast and nipple: Secondary | ICD-10-CM | POA: Diagnosis not present

## 2022-01-10 MED ORDER — METFORMIN HCL ER 500 MG PO TB24: 1000.0000 mg | ORAL_TABLET | Freq: Two times a day (BID) | ORAL | 0 refills | Status: DC

## 2022-01-10 NOTE — Telephone Encounter (Signed)
Medication sent to [facility] on [address]

## 2022-01-10 NOTE — Telephone Encounter (Signed)
Message from [place] sent at 01/10/2022 12:31 PM EDT  Summary: prescription refill   Pt called saying her metformin was sent to the wrong pharmacy.  It needs to be sent to [facility] on [address].  This was back in may and she never picked it up.  She is asking it now bw sent to the [facility].   CB#  [phone]           Call History   Type Contact Phone/Fax User  01/10/2022 12:29 PM EDT Phone (Incoming) Kaitlyn Cervantes (Self)  Greggory Keen D   Reason for Disposition  [1] Caller has NON-URGENT medicine question about med that PCP prescribed AND [2] triager unable to answer question    Rx for metformin sent to wrong pharmacy.  Needs new rx sent to [facility] on [address] because rx is expired.  Answer Assessment - Initial Assessment Questions 1. NAME of MEDICATION: "What medicine are you calling about?"     Metformin 2. QUESTION: "What is your question?" (e.g., double dose of medicine, side effect)     It was sent to wrong pharmacy.  Needs it sent to [facility] on [address].  Rx for the metformin has expired so routed the request to Primary Care at [facility] 3. PRESCRIBING HCP: "Who prescribed it?" Reason: if prescribed by specialist, call should be referred to that group.     Durene Fruits, NP 4. SYMPTOMS: "Do you have any symptoms?"     [name] 5. SEVERITY: If symptoms are present, ask "Are they mild, moderate or severe?"     [name] 6. PREGNANCY:  "Is there any chance that you are pregnant?" "When was your last menstrual period?"     [name]  Protocols used: Medication Question Call-Cervantes-AH I called pt and let her know rx was being sent to Durene Fruits, NP and that Cervantes new rx would be sent to correct pharmacy [facility] on [address].   She thanked me for getting that corrected to the right pharmacy.

## 2022-01-10 NOTE — Telephone Encounter (Signed)
  Chief Complaint: Metformin sent to wrong pharmacy.  Needs a new rx for the metformin sent to [facility] on [address] because the rx is expired. Symptoms: N/A Frequency: N/A Pertinent Negatives: Patient denies N/A Disposition: '[]'$ ED /'[]'$ Urgent Care (no appt availability in office) / '[]'$ Appointment(In office/virtual)/ '[]'$  [place] [facility]/ '[x]'$ Home Care/ '[]'$ Refused Recommended Disposition /'[]'$ [place] [facility]/ '[]'$  Follow-up with PCP Additional Notes: New rx for metformin to be sent to [facility] on [address].

## 2022-02-02 NOTE — Progress Notes (Signed)
Patient ID: Kaitlyn Cervantes, female    DOB: 07-Aug-1960  MRN: [id number]  CC: Annual Physical Exam   Subjective: Kaitlyn Cervantes is a 61 y.o. female who presents for annual physical exam.   Her concerns today include:  - She is not taking Metformin. Reports she will think about taking Metformin and that she forgot to pickup from the pharmacy since last appointment.  - She is not taking Rosuvastatin states "I have my reasons why." - She has an eye doctor and plans to schedule appointment soon. - She is aware of appointment with Cardiology on 04/08/2022 for management of hypertension and high cholesterol.  - She has an upcoming appointment scheduled with [facility].  - Several weeks ago pulled a tick from posterior neck. Denies symptoms. Would like to be screened for Lyme disease.  - Left should began hurting 2 weeks ago. Denies red flag symptoms such as but not limited to chest pain, shortness of breath, recent injury/trauma. She does lay on the left shoulder during bedtime. We discussed conservative and over-the-counter measures and to follow-up with me as needed, patient agreeable.   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     Blood Glucose Monitoring Suppl (ONETOUCH VERIO) w/Device KIT 1 kit by Does not apply route every morning. 1 kit 1   bumetanide (BUMEX) 1 MG tablet Take 1 mg by mouth daily.      carvedilol (COREG) 6.25 MG tablet Take 6.25 mg by mouth 2 (two) times daily with a meal.      Cholecalciferol (VITAMIN D3) 5000 units TABS Take 5,000 Units by mouth daily.     Coenzyme Q10 (COQ10) 100 MG CAPS Take 1 capsule by mouth daily.     dapagliflozin propanediol (FARXIGA) 5 MG TABS tablet Take 1 tablet (5 mg total) by mouth daily before breakfast. 30 tablet 0   Garlic 1000 MG CAPS Take 1 capsule by mouth daily.     glucose blood (ONETOUCH VERIO) test strip Check blood sugar 3 x daily 200 each 3   Lancet Devices (ONE TOUCH DELICA LANCING DEV) MISC 1 application by Does not apply route every morning. 100 each 1   lisinopril (ZESTRIL) 40 MG tablet Take 1 tablet (40 mg total) by mouth daily. 30 tablet 0   metFORMIN (GLUCOPHAGE-XR) 500 MG 24 hr tablet Take 2 tablets (1,000 mg total) by mouth 2 (two) times daily with a meal. 120 tablet 0   Multiple Vitamin (MULTIVITAMIN WITH MINERALS) TABS tablet Take 1 tablet by mouth daily.     ONETOUCH DELICA LANCETS FINE MISC 1 application by Does not apply route 2 (two) times daily. 100 each 3   rosuvastatin (CRESTOR) 10 MG tablet Take 1 tablet (10  mg total) by mouth daily. (Patient not taking: Reported on 11/03/2021) 90 tablet 3   spironolactone (ALDACTONE) 25 MG tablet Take 25 mg by mouth daily.      Current Facility-Administered Medications on File Prior to Visit  Medication Dose Route Frequency Provider Last Rate Last Admin   0.9 %  sodium chloride infusion  500 mL Intravenous Continuous Ladene Artist, MD        Allergies  Allergen Reactions   Iohexol Hives    iodine   Other Itching and Swelling    Nuts excludes peanuts    Social History   Socioeconomic History   Marital status: Widowed    Spouse name: Not on file   Number of  children: Not on file   Years of education: Not on file   Highest education level: Not on file  Occupational History   Not on file  Tobacco Use   Smoking status: Never    Passive exposure: Never   Smokeless tobacco: Never  Vaping Use   Vaping Use: Never used  Substance and Sexual Activity   Alcohol use: No    Comment: very rare   Drug use: No   Sexual activity: Not Currently    Birth control/protection: Post-menopausal    Comment: 1st intercourse- 30, partners- 1, widow  Other Topics Concern   Not on file  Social History Narrative   Not on file   Social Determinants of Health   Financial Resource Strain: Not on file  Food Insecurity: Not on file  Transportation Needs: Not on file  Physical Activity: Not on file  Stress: Not on file  Social Connections: Not on file  Intimate Partner Violence: Not on file    Family History  Problem Relation Age of Onset   Breast cancer Mother        x2 10's & 31's   Cancer Mother    Diabetes Mother    Hypertension Mother    Heart disease Father    Early death Father    Cancer Sister    Cancer Maternal Aunt    Breast cancer Maternal Aunt        mid 107's   Diabetes Brother    Depression Brother    Drug abuse Brother    Mental illness Brother    Colon polyps Neg Hx    Colon cancer Neg Hx    Esophageal cancer Neg Hx    Rectal cancer Neg Hx    Stomach cancer Neg Hx     Past Surgical History:  Procedure Laterality Date   BREAST BIOPSY     CESAREAN SECTION     x3   kidney stones  2007   right mastectomy     07/2016   SPLENECTOMY     age 19     ROS: Review of Systems Negative except as stated above  PHYSICAL EXAM: BP 127/78 (BP Location: Left Arm, Patient Position: Sitting, Cuff Size: Large)   Pulse 69   Temp 98.3 F (36.8 C)   Resp 16   Ht 5' 4.49" (1.638 m)   Wt 205 lb (93 kg)   SpO2 94%   BMI 34.66 kg/m   Physical Exam HENT:     Head: Normocephalic and atraumatic.     Right Ear: Tympanic membrane, ear  canal and external ear normal.     Left Ear: Tympanic membrane, ear canal and external ear normal.     Nose: Nose normal.     Mouth/Throat:  Mouth: Mucous membranes are moist.     Pharynx: Oropharynx is clear.  Eyes:     Extraocular Movements: Extraocular movements intact.     Conjunctiva/sclera: Conjunctivae normal.     Pupils: Pupils are equal, round, and reactive to light.  Cardiovascular:     Rate and Rhythm: Normal rate and regular rhythm.     Pulses: Normal pulses.     Heart sounds: Normal heart sounds.  Pulmonary:     Effort: Pulmonary effort is normal.     Breath sounds: Normal breath sounds.  Chest:     Comments: Patient declined.  Abdominal:     General: Bowel sounds are normal.     Palpations: Abdomen is soft.  Genitourinary:    Comments: Patient declined. Musculoskeletal:        General: Normal range of motion.     Right shoulder: Normal.     Left shoulder: Normal.     Right upper arm: Normal.     Left upper arm: Normal.     Right elbow: Normal.     Left elbow: Normal.     Right forearm: Normal.     Left forearm: Normal.     Right wrist: Normal.     Left wrist: Normal.     Right hand: Normal.     Left hand: Normal.     Cervical back: Normal, normal range of motion and neck supple.     Thoracic back: Normal.     Lumbar back: Normal.     Right hip: Normal.     Left hip: Normal.     Right upper leg: Normal.     Left upper leg: Normal.     Right knee: Normal.     Left knee: Normal.     Right lower leg: Normal.     Left lower leg: Normal.     Right ankle: Normal.     Left ankle: Normal.     Right foot: Normal.     Left foot: Normal.  Skin:    General: Skin is warm and dry.     Capillary Refill: Capillary refill takes less than 2 seconds.  Neurological:     General: No focal deficit present.     Mental Status: She is alert and oriented to person, place, and time.  Psychiatric:        Mood and Affect: Mood normal.        Behavior: Behavior normal.     Diabetic foot exam was performed with the following findings:   No deformities, ulcerations, or other skin breakdown Intact posterior tibialis and dorsalis pedis pulses Decreased sensation to touch and monofilament testing bilaterally.     Results for orders placed or performed in visit on 02/10/22  POCT glycosylated hemoglobin (Hb A1C)  Result Value Ref Range   Hemoglobin A1C 9.4 (A) 4.0 - 5.6 %   HbA1c POC (<> result, manual entry)     HbA1c, POC (prediabetic range)     HbA1c, POC (controlled diabetic range)      ASSESSMENT AND PLAN: 1. Annual physical exam - Counseled on 150 minutes of exercise per week as tolerated, healthy eating (including decreased daily intake of saturated fats, cholesterol, added sugars, sodium), STI prevention, and routine healthcare maintenance.  2. Screening for metabolic disorder - Screening liver function.  - Hepatic Function Panel  3. Screening for deficiency anemia - CBC to screen for anemia. - CBC  4. Thyroid disorder screen - TSH to check thyroid function.  -  TSH  5. Type 2 diabetes mellitus without complication, without long-term current use of insulin ([place]) 6. Nonadherence to medication - Hemoglobin A1c not at goal at 9.4%, goal < 7%. - Discussed the importance of healthy eating habits, low-carbohydrate diet, low-sugar diet, regular aerobic exercise (at least 150 minutes a week as tolerated) and medication compliance to achieve or maintain control of diabetes. - Referral to Endocrinology for further evaluation and management. - POCT glycosylated hemoglobin (Hb A1C) - Ambulatory referral to Endocrinology  7. Diabetic eye exam [facility]) - Patient reports she has an established ophthalmologist.   8. Encounter for diabetic foot exam ([place]) - Completed today in office.   9. Encounter for screening mammogram for malignant neoplasm of breast - Referral for breast cancer screening by mammogram.  - MM Digital Screening; Future  10. Pap  smear for cervical cancer screening 11. Routine screening for STI (sexually transmitted infection) - Referral to Gynecology for further evaluation and management.  - Ambulatory referral to Gynecology  12. Tick bite of neck, initial encounter - Screening.  - Lyme Disease Serology w/Reflex    Patient was given the opportunity to ask questions.  Patient verbalized understanding of the plan and was able to repeat key elements of the plan. Patient was given clear instructions to go to Emergency Department or return to medical center if symptoms don't improve, worsen, or new problems develop.The patient verbalized understanding.   Orders Placed This Encounter  Procedures   MM Digital Screening   Hepatic Function Panel   TSH   CBC   Lyme Disease Serology w/Reflex   Ambulatory referral to Gynecology   Ambulatory referral to Endocrinology   POCT glycosylated hemoglobin (Hb A1C)    Return in about 1 year (around 02/11/2023) for Physical per patient preference.  Camillia Herter, NP

## 2022-02-10 ENCOUNTER — Encounter: Payer: Self-pay | Admitting: Family

## 2022-02-10 ENCOUNTER — Ambulatory Visit (INDEPENDENT_AMBULATORY_CARE_PROVIDER_SITE_OTHER): Payer: Federal, State, Local not specified - PPO | Admitting: Family

## 2022-02-10 VITALS — BP 127/78 | HR 69 | Temp 98.3°F | Resp 16 | Ht 64.49 in | Wt 205.0 lb

## 2022-02-10 DIAGNOSIS — Z1231 Encounter for screening mammogram for malignant neoplasm of breast: Secondary | ICD-10-CM

## 2022-02-10 DIAGNOSIS — Z113 Encounter for screening for infections with a predominantly sexual mode of transmission: Secondary | ICD-10-CM

## 2022-02-10 DIAGNOSIS — S1096XA Insect bite of unspecified part of neck, initial encounter: Secondary | ICD-10-CM | POA: Diagnosis not present

## 2022-02-10 DIAGNOSIS — W57XXXA Bitten or stung by nonvenomous insect and other nonvenomous arthropods, initial encounter: Secondary | ICD-10-CM | POA: Diagnosis not present

## 2022-02-10 DIAGNOSIS — E119 Type 2 diabetes mellitus without complications: Secondary | ICD-10-CM

## 2022-02-10 DIAGNOSIS — Z13228 Encounter for screening for other metabolic disorders: Secondary | ICD-10-CM

## 2022-02-10 DIAGNOSIS — Z124 Encounter for screening for malignant neoplasm of cervix: Secondary | ICD-10-CM

## 2022-02-10 DIAGNOSIS — Z91148 Patient's other noncompliance with medication regimen for other reason: Secondary | ICD-10-CM | POA: Diagnosis not present

## 2022-02-10 DIAGNOSIS — Z130 Encounter for screening for diseases of the blood and blood-forming organs and certain disorders involving the immune mechanism: Secondary | ICD-10-CM | POA: Diagnosis not present

## 2022-02-10 DIAGNOSIS — Z0000 Encounter for general adult medical examination without abnormal findings: Secondary | ICD-10-CM

## 2022-02-10 DIAGNOSIS — Z0001 Encounter for general adult medical examination with abnormal findings: Secondary | ICD-10-CM | POA: Diagnosis not present

## 2022-02-10 DIAGNOSIS — Z1329 Encounter for screening for other suspected endocrine disorder: Secondary | ICD-10-CM

## 2022-02-10 LAB — POCT GLYCOSYLATED HEMOGLOBIN (HGB A1C): Hemoglobin A1C: 9.4 % — AB (ref 4.0–5.6)

## 2022-02-10 NOTE — Progress Notes (Signed)
Pt presents for annual physical exam pap declined wants referral to gyn  Not taking rosuvastatin

## 2022-02-10 NOTE — Patient Instructions (Signed)
Preventive Care 40-62 Years Old, Female Preventive care refers to lifestyle choices and visits with your health care provider that can promote health and wellness. Preventive care visits are also called wellness exams. What can I expect for my preventive care visit? Counseling Your health care provider may ask you questions about your: Medical history, including: Past medical problems. Family medical history. Pregnancy history. Current health, including: Menstrual cycle. Method of birth control. Emotional well-being. Home life and relationship well-being. Sexual activity and sexual health. Lifestyle, including: Alcohol, nicotine or tobacco, and drug use. Access to firearms. Diet, exercise, and sleep habits. Work and work environment. Sunscreen use. Safety issues such as seatbelt and bike helmet use. Physical exam Your health care provider will check your: Height and weight. These may be used to calculate your BMI (body mass index). BMI is a measurement that tells if you are at a healthy weight. Waist circumference. This measures the distance around your waistline. This measurement also tells if you are at a healthy weight and may help predict your risk of certain diseases, such as type 2 diabetes and high blood pressure. Heart rate and blood pressure. Body temperature. Skin for abnormal spots. What immunizations do I need?  Vaccines are usually given at various ages, according to a schedule. Your health care provider will recommend vaccines for you based on your age, medical history, and lifestyle or other factors, such as travel or where you work. What tests do I need? Screening Your health care provider may recommend screening tests for certain conditions. This may include: Lipid and cholesterol levels. Diabetes screening. This is done by checking your blood sugar (glucose) after you have not eaten for a while (fasting). Pelvic exam and Pap test. Hepatitis B test. Hepatitis C  test. HIV (human immunodeficiency virus) test. STI (sexually transmitted infection) testing, if you are at risk. Lung cancer screening. Colorectal cancer screening. Mammogram. Talk with your health care provider about when you should start having regular mammograms. This may depend on whether you have a family history of breast cancer. BRCA-related cancer screening. This may be done if you have a family history of breast, ovarian, tubal, or peritoneal cancers. Bone density scan. This is done to screen for osteoporosis. Talk with your health care provider about your test results, treatment options, and if necessary, the need for more tests. Follow these instructions at home: Eating and drinking  Eat a diet that includes fresh fruits and vegetables, whole grains, lean protein, and low-fat dairy products. Take vitamin and mineral supplements as recommended by your health care provider. Do not drink alcohol if: Your health care provider tells you not to drink. You are pregnant, may be pregnant, or are planning to become pregnant. If you drink alcohol: Limit how much you have to 0-1 drink a day. Know how much alcohol is in your drink. In the [place] one drink equals one 12 oz bottle of beer (355 mL), one 5 oz glass of wine (148 mL), or one 1 oz glass of hard liquor (44 mL). Lifestyle Brush your teeth every morning and night with fluoride toothpaste. Floss one time each day. Exercise for at least 30 minutes 5 or more days each week. Do not use any products that contain nicotine or tobacco. These products include cigarettes, chewing tobacco, and vaping devices, such as e-cigarettes. If you need help quitting, ask your health care provider. Do not use drugs. If you are sexually active, practice safe sex. Use a condom or other form of protection to   prevent STIs. If you do not wish to become pregnant, use a form of birth control. If you plan to become pregnant, see your health care provider for a  prepregnancy visit. Take aspirin only as told by your health care provider. Make sure that you understand how much to take and what form to take. Work with your health care provider to find out whether it is safe and beneficial for you to take aspirin daily. Find healthy ways to manage stress, such as: Meditation, yoga, or listening to music. Journaling. Talking to a trusted person. Spending time with friends and family. Minimize exposure to UV radiation to reduce your risk of skin cancer. Safety Always wear your seat belt while driving or riding in a vehicle. Do not drive: If you have been drinking alcohol. Do not ride with someone who has been drinking. When you are tired or distracted. While texting. If you have been using any mind-altering substances or drugs. Wear a helmet and other protective equipment during sports activities. If you have firearms in your house, make sure you follow all gun safety procedures. Seek help if you have been physically or sexually abused. What's next? Visit your health care provider once a year for an annual wellness visit. Ask your health care provider how often you should have your eyes and teeth checked. Stay up to date on all vaccines. This information is not intended to replace advice given to you by your health care provider. Make sure you discuss any questions you have with your health care provider. Document Revised: 12/23/2020 Document Reviewed: 12/23/2020 Elsevier Patient Education  [place].

## 2022-02-11 ENCOUNTER — Other Ambulatory Visit: Payer: Self-pay | Admitting: Nephrology

## 2022-02-11 DIAGNOSIS — R809 Proteinuria, unspecified: Secondary | ICD-10-CM | POA: Diagnosis not present

## 2022-02-11 DIAGNOSIS — N200 Calculus of kidney: Secondary | ICD-10-CM | POA: Diagnosis not present

## 2022-02-11 DIAGNOSIS — E1129 Type 2 diabetes mellitus with other diabetic kidney complication: Secondary | ICD-10-CM | POA: Diagnosis not present

## 2022-02-11 DIAGNOSIS — N182 Chronic kidney disease, stage 2 (mild): Secondary | ICD-10-CM | POA: Diagnosis not present

## 2022-02-11 DIAGNOSIS — E1122 Type 2 diabetes mellitus with diabetic chronic kidney disease: Secondary | ICD-10-CM | POA: Diagnosis not present

## 2022-02-11 LAB — TSH: TSH: 1.32 u[IU]/mL (ref 0.450–4.500)

## 2022-02-11 LAB — CBC
Hematocrit: 33.8 % — ABNORMAL LOW (ref 34.0–46.6)
Hemoglobin: 10.8 g/dL — ABNORMAL LOW (ref 11.1–15.9)
MCH: 25.7 pg — ABNORMAL LOW (ref 26.6–33.0)
MCHC: 32 g/dL (ref 31.5–35.7)
MCV: 81 fL (ref 79–97)
Platelets: 302 10*3/uL (ref 150–450)
RBC: 4.2 x10E6/uL (ref 3.77–5.28)
RDW: 13.3 % (ref 11.7–15.4)
WBC: 4.9 10*3/uL (ref 3.4–10.8)

## 2022-02-11 LAB — HEPATIC FUNCTION PANEL
ALT: 9 IU/L (ref 0–32)
AST: 11 IU/L (ref 0–40)
Albumin: 3.8 g/dL — ABNORMAL LOW (ref 3.9–4.9)
Alkaline Phosphatase: 110 IU/L (ref 44–121)
Bilirubin Total: 0.4 mg/dL (ref 0.0–1.2)
Bilirubin, Direct: 0.11 mg/dL (ref 0.00–0.40)
Total Protein: 6.9 g/dL (ref 6.0–8.5)

## 2022-02-11 LAB — LYME DISEASE SEROLOGY W/REFLEX: Lyme Total Antibody EIA: NEGATIVE

## 2022-02-14 ENCOUNTER — Ambulatory Visit
Admission: RE | Admit: 2022-02-14 | Discharge: 2022-02-14 | Disposition: A | Discharge status: Home / Self Care | Payer: Federal, State, Local not specified - PPO | Source: Ambulatory Visit | Attending: Nephrology | Admitting: Nephrology

## 2022-02-14 DIAGNOSIS — N200 Calculus of kidney: Secondary | ICD-10-CM

## 2022-02-14 DIAGNOSIS — N182 Chronic kidney disease, stage 2 (mild): Secondary | ICD-10-CM

## 2022-02-14 DIAGNOSIS — N189 Chronic kidney disease, unspecified: Secondary | ICD-10-CM | POA: Diagnosis not present

## 2022-04-07 DIAGNOSIS — I119 Hypertensive heart disease without heart failure: Secondary | ICD-10-CM | POA: Diagnosis not present

## 2022-04-08 DIAGNOSIS — I5032 Chronic diastolic (congestive) heart failure: Secondary | ICD-10-CM | POA: Diagnosis not present

## 2022-04-08 DIAGNOSIS — E785 Hyperlipidemia, unspecified: Secondary | ICD-10-CM | POA: Diagnosis not present

## 2022-04-08 DIAGNOSIS — I110 Hypertensive heart disease with heart failure: Secondary | ICD-10-CM | POA: Diagnosis not present

## 2022-05-03 DIAGNOSIS — E1165 Type 2 diabetes mellitus with hyperglycemia: Secondary | ICD-10-CM | POA: Diagnosis not present

## 2022-05-03 DIAGNOSIS — Z91148 Patient's other noncompliance with medication regimen for other reason: Secondary | ICD-10-CM | POA: Diagnosis not present

## 2022-05-03 DIAGNOSIS — Z7984 Long term (current) use of oral hypoglycemic drugs: Secondary | ICD-10-CM | POA: Diagnosis not present

## 2022-06-16 ENCOUNTER — Encounter: Payer: Self-pay | Admitting: Family

## 2022-09-13 DIAGNOSIS — N1831 Chronic kidney disease, stage 3a: Secondary | ICD-10-CM | POA: Diagnosis not present

## 2022-09-13 DIAGNOSIS — R809 Proteinuria, unspecified: Secondary | ICD-10-CM | POA: Diagnosis not present

## 2022-09-13 DIAGNOSIS — E1122 Type 2 diabetes mellitus with diabetic chronic kidney disease: Secondary | ICD-10-CM | POA: Diagnosis not present

## 2022-09-13 DIAGNOSIS — N200 Calculus of kidney: Secondary | ICD-10-CM | POA: Diagnosis not present

## 2022-09-13 DIAGNOSIS — E1129 Type 2 diabetes mellitus with other diabetic kidney complication: Secondary | ICD-10-CM | POA: Diagnosis not present

## 2022-11-01 ENCOUNTER — Ambulatory Visit
Admission: EM | Admit: 2022-11-01 | Discharge: 2022-11-01 | Disposition: A | Discharge status: Home / Self Care | Payer: Federal, State, Local not specified - PPO | Attending: Urgent Care | Admitting: Urgent Care

## 2022-11-01 DIAGNOSIS — E119 Type 2 diabetes mellitus without complications: Secondary | ICD-10-CM | POA: Insufficient documentation

## 2022-11-01 DIAGNOSIS — B349 Viral infection, unspecified: Secondary | ICD-10-CM | POA: Diagnosis not present

## 2022-11-01 DIAGNOSIS — I10 Essential (primary) hypertension: Secondary | ICD-10-CM

## 2022-11-01 DIAGNOSIS — Z1152 Encounter for screening for COVID-19: Secondary | ICD-10-CM | POA: Diagnosis not present

## 2022-11-01 DIAGNOSIS — R070 Pain in throat: Secondary | ICD-10-CM | POA: Insufficient documentation

## 2022-11-01 DIAGNOSIS — I509 Heart failure, unspecified: Secondary | ICD-10-CM | POA: Insufficient documentation

## 2022-11-01 DIAGNOSIS — I110 Hypertensive heart disease with heart failure: Secondary | ICD-10-CM | POA: Insufficient documentation

## 2022-11-01 LAB — POCT RAPID STREP A (OFFICE): Rapid Strep A Screen: NEGATIVE

## 2022-11-01 MED ORDER — CETIRIZINE HCL 10 MG PO TABS: 10.0000 mg | ORAL_TABLET | Freq: Every day | ORAL | 0 refills | Status: DC

## 2022-11-01 MED ORDER — IPRATROPIUM BROMIDE 0.03 % NA SOLN: 2.0000 | Freq: Two times a day (BID) | NASAL | 0 refills | Status: DC

## 2022-11-01 MED ORDER — PROMETHAZINE-DM 6.25-15 MG/5ML PO SYRP: 5.0000 mL | ORAL_SOLUTION | Freq: Three times a day (TID) | ORAL | 0 refills | Status: DC | PRN

## 2022-11-01 NOTE — Discharge Instructions (Addendum)
We will notify you of your test results as they arrive and may take between about 24 hours.  I encourage you to sign up for MyChart if you have not already done so as this can be the easiest way for us to communicate results to you online or through a phone app.  Generally, we only contact you if it is a positive test result.  In the meantime, if you develop worsening symptoms including fever, chest pain, shortness of breath despite our current treatment plan then please report to the emergency room as this may be a sign of worsening status from possible viral infection.  Otherwise, we will manage this as a viral syndrome. For sore throat or cough try using a honey-based tea. Use 3 teaspoons of honey with juice squeezed from half lemon. Place shaved pieces of ginger into 1/2-1 cup of water and warm over stove top. Then mix the ingredients and repeat every 4 hours as needed. Please take Tylenol 500mg-650mg every 6 hours for aches and pains, fevers. Hydrate very well with at least 2 liters of water. Eat light meals such as soups to replenish electrolytes and soft fruits, veggies. Start an antihistamine like Zyrtec (10mg daily) for postnasal drainage, sinus congestion.  You can take this together with Atrovent nasal spray 2-3 times a day as needed for the same kind of congestion.  Use the cough medications as needed.   

## 2022-11-01 NOTE — ED Triage Notes (Signed)
Pt c/o productive cough, congestion, sore throat (painful to swallow), and chills x3 days. Has tried salt water gargles.

## 2022-11-01 NOTE — ED Provider Notes (Signed)
[facility]  Note:  This document was prepared using Conservation officer, historic buildings and may include unintentional dictation errors.  MRN: [id number] DOB: [birth year]  Subjective:   Kaitlyn Cervantes is a 62 y.o. female presenting for 3-day history of acute onset productive cough, sinus congestion, throat pain, painful swallowing, chills.  No chest pain, shortness of breath or wheezing.  Patient has type 2 diabetes treated without insulin.  Also has hypertension, did not take her blood pressure medication the past 2 days.  Has a history of congestive heart failure but feels this is different for her.  Patient requested a strep test and COVID test.   Current Facility-Administered Medications:    0.9 %  sodium chloride infusion, 500 mL, Intravenous, Continuous, Meryl Dare, MD  Current Outpatient Medications:    aspirin EC 81 MG tablet, Take 81 mg by mouth daily., Disp: , Rfl:    bumetanide (BUMEX) 1 MG tablet, Take 1 mg by mouth daily. , Disp: , Rfl:    carvedilol (COREG) 6.25 MG tablet, Take 6.25 mg by mouth 2 (two) times daily with a meal. , Disp: , Rfl:    Cholecalciferol (VITAMIN D3) 5000 units TABS, Take 5,000 Units by mouth daily., Disp: , Rfl:    lisinopril (ZESTRIL) 40 MG tablet, Take 1 tablet (40 mg total) by mouth daily., Disp: 30 tablet, Rfl: 0   spironolactone (ALDACTONE) 25 MG tablet, Take 25 mg by mouth daily. , Disp: , Rfl:    Blood Glucose Monitoring Suppl (ONETOUCH VERIO) w/Device KIT, 1 kit by Does not apply route every morning., Disp: 1 kit, Rfl: 1   Coenzyme Q10 (COQ10) 100 MG CAPS, Take 1 capsule by mouth daily., Disp: , Rfl:    dapagliflozin propanediol (FARXIGA) 5 MG TABS tablet, Take 1 tablet (5 mg total) by mouth daily before breakfast., Disp: 30 tablet, Rfl: 0   Garlic 1000 MG CAPS, Take 1 capsule by mouth daily., Disp: , Rfl:    glucose blood (ONETOUCH VERIO) test strip, Check blood sugar 3 x daily, Disp: 200 each, Rfl: 3   Lancet  Devices (ONE TOUCH DELICA LANCING DEV) MISC, 1 application by Does not apply route every morning., Disp: 100 each, Rfl: 1   metFORMIN (GLUCOPHAGE-XR) 500 MG 24 hr tablet, Take 2 tablets (1,000 mg total) by mouth 2 (two) times daily with a meal., Disp: 120 tablet, Rfl: 0   Multiple Vitamin (MULTIVITAMIN WITH MINERALS) TABS tablet, Take 1 tablet by mouth daily., Disp: , Rfl:    ONETOUCH DELICA LANCETS FINE MISC, 1 application by Does not apply route 2 (two) times daily., Disp: 100 each, Rfl: 3   rosuvastatin (CRESTOR) 10 MG tablet, Take 1 tablet (10 mg total) by mouth daily. 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   BRCA I & II negative   Diabetes mellitus without complication (HCC)    Gallstones    has not had removed   History of chicken pox    History of stomach ulcers    treated    Hypertension    Kidney stones    Sleep apnea 2005  UTI (urinary tract infection)      Past Surgical History:  Procedure Laterality Date   BREAST BIOPSY     CESAREAN SECTION     x3   kidney stones  2007   right mastectomy     07/2016   SPLENECTOMY     age 25     Family History  Problem Relation Age of Onset   Breast cancer Mother        x2 51's & 19's   Cancer Mother    Diabetes Mother    Hypertension Mother    Heart disease Father    Early death Father    Cancer Sister    Cancer Maternal Aunt    Breast cancer Maternal Aunt        mid 14's   Diabetes Brother    Depression Brother    Drug abuse Brother    Mental illness Brother    Colon polyps Neg Hx    Colon cancer Neg Hx    Esophageal cancer Neg Hx    Rectal cancer Neg Hx    Stomach cancer Neg Hx      Social History   Tobacco Use   Smoking status: Never    Passive exposure: Never   Smokeless tobacco: Never  Vaping Use   Vaping Use: Never used  Substance Use Topics   Alcohol use: No    Comment: very rare   Drug use: No    ROS   Objective:   Vitals: BP (!) 178/102 (BP Location: Right Arm)   Pulse 92   Temp 98.1 F (36.7 C) (Oral)   Resp 18   SpO2 96%   Physical Exam Constitutional:      General: She is not in acute distress.    Appearance: Normal appearance. She is well-developed and normal weight. She is not ill-appearing, toxic-appearing or diaphoretic.  HENT:     Head: Normocephalic and atraumatic.     Right Ear: Tympanic membrane, ear canal and external ear normal. No drainage or tenderness. No middle ear effusion. There is no impacted cerumen. Tympanic membrane is not erythematous or bulging.     Left Ear: Tympanic membrane, ear canal and external ear normal. No drainage or tenderness.  No middle ear effusion. There is no impacted cerumen. Tympanic membrane is not erythematous or bulging.     Nose: Nose normal. No congestion or rhinorrhea.     Mouth/Throat:     Mouth: Mucous membranes are moist. No oral lesions.     Pharynx: No pharyngeal swelling, oropharyngeal exudate, posterior oropharyngeal erythema or uvula swelling.     Tonsils: No tonsillar exudate or tonsillar abscesses.  Eyes:     General: No scleral icterus.       Right eye: No discharge.        Left eye: No discharge.     Extraocular Movements: Extraocular movements intact.     Right eye: Normal extraocular motion.     Left eye: Normal extraocular motion.     Conjunctiva/sclera: Conjunctivae normal.  Cardiovascular:     Rate and Rhythm: Normal rate and regular rhythm.     Heart sounds: Normal heart sounds. No murmur heard.    No friction rub. No gallop.  Pulmonary:     Effort: Pulmonary effort is normal. No respiratory distress.     Breath sounds: No stridor. No wheezing, rhonchi or rales.   Chest:     Chest wall: No tenderness.  Musculoskeletal:     Cervical back:  Normal range of motion and neck supple.  Lymphadenopathy:     Cervical: No cervical adenopathy.  Skin:    General: Skin is warm and dry.  Neurological:     General: No focal deficit present.     Mental Status: She is alert and oriented to person, place, and time.  Psychiatric:        Mood and Affect: Mood normal.        Behavior: Behavior normal.     Results for orders placed or performed during the hospital encounter of 11/01/22 (from the past 24 hour(s))  POCT rapid strep A     Status: None   Collection Time: 11/01/22 10:11 AM  Result Value Ref Range   Rapid Strep A Screen Negative Negative    Assessment and Plan :   PDMP not reviewed this encounter.  1. Acute viral syndrome   2. Throat pain   3. Essential hypertension    Will defer strep culture as I have very low suspicion for strep.  I did oblige patient and obtained a rapid strep which was negative as expected.  Deferred imaging given clear cardiopulmonary exam, hemodynamically stable vital signs.  Will manage for viral illness such as viral URI, viral syndrome, viral rhinitis, COVID-19. Recommended supportive care. Offered scripts for symptomatic relief. Testing is pending. Counseled patient on potential for adverse effects with medications prescribed/recommended today, ER and return-to-clinic precautions discussed, patient verbalized understanding.   Patient would be a good candidate for COVID antivirals if she test positive.   Wallis Bamberg, [place] 11/01/22 1056

## 2022-11-02 LAB — SARS CORONAVIRUS 2 (TAT 6-24 HRS): SARS Coronavirus 2: NEGATIVE

## 2022-12-10 IMAGING — DX DG HIP (WITH OR WITHOUT PELVIS) 2-3V*L*
3 series · 3 of 3 positions shown · non-contrast
Comparison: None.

CLINICAL DATA: Left hip pain

EXAM:
AP view of the pelvis and two views of the left hip

[pelvis ap]
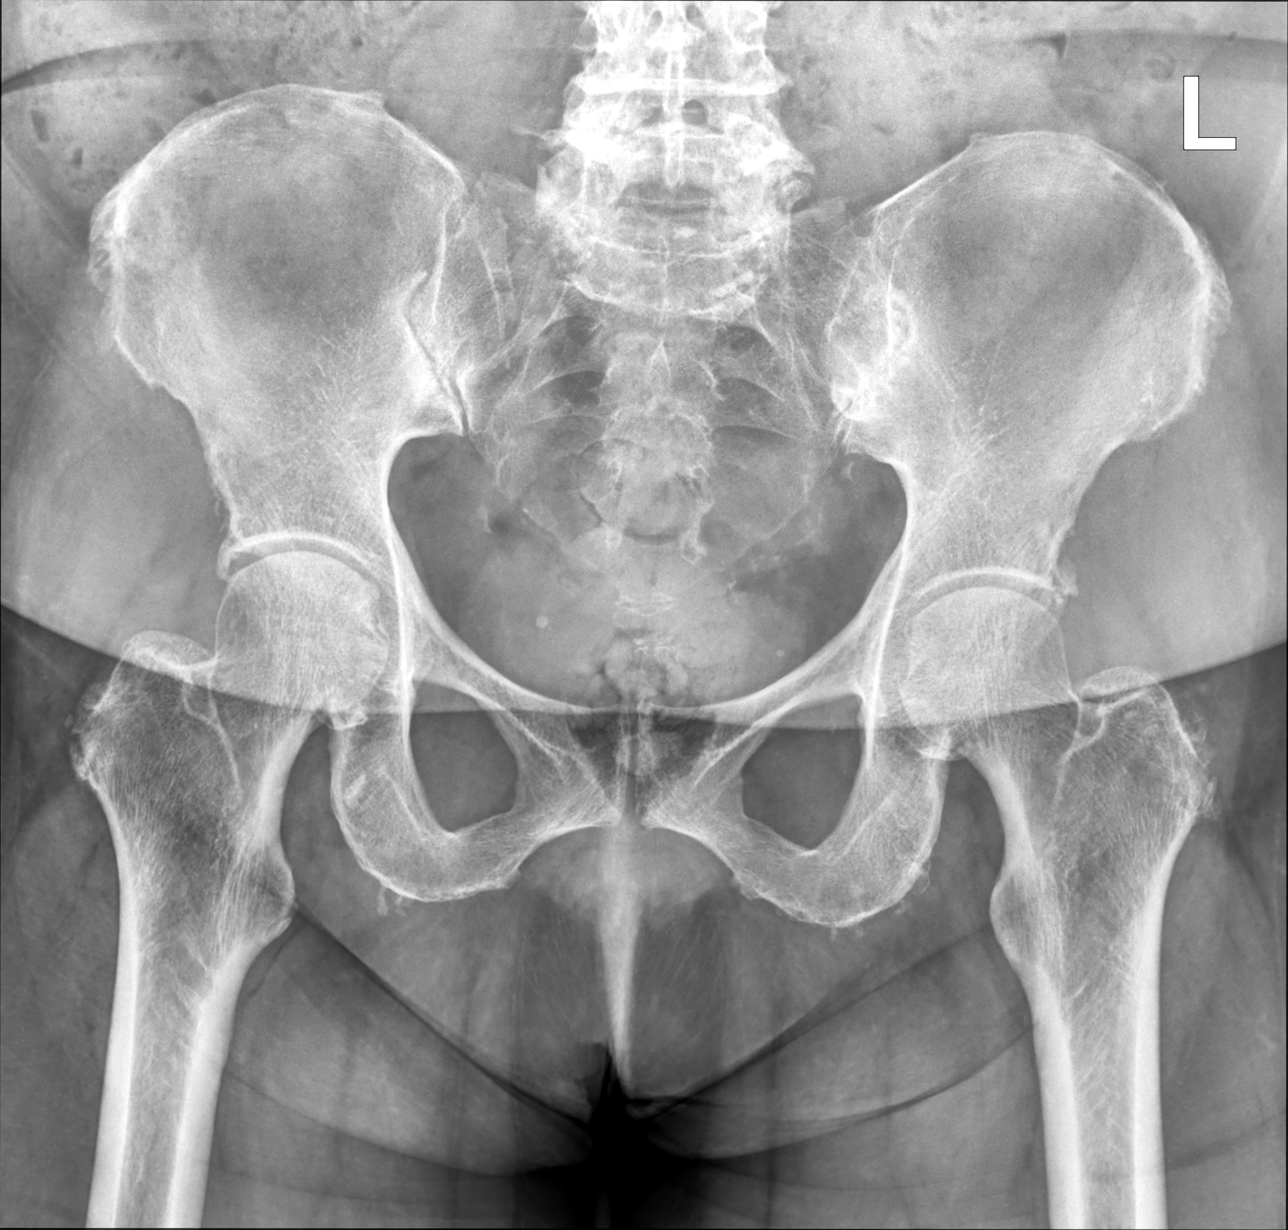

[hip joint ap]
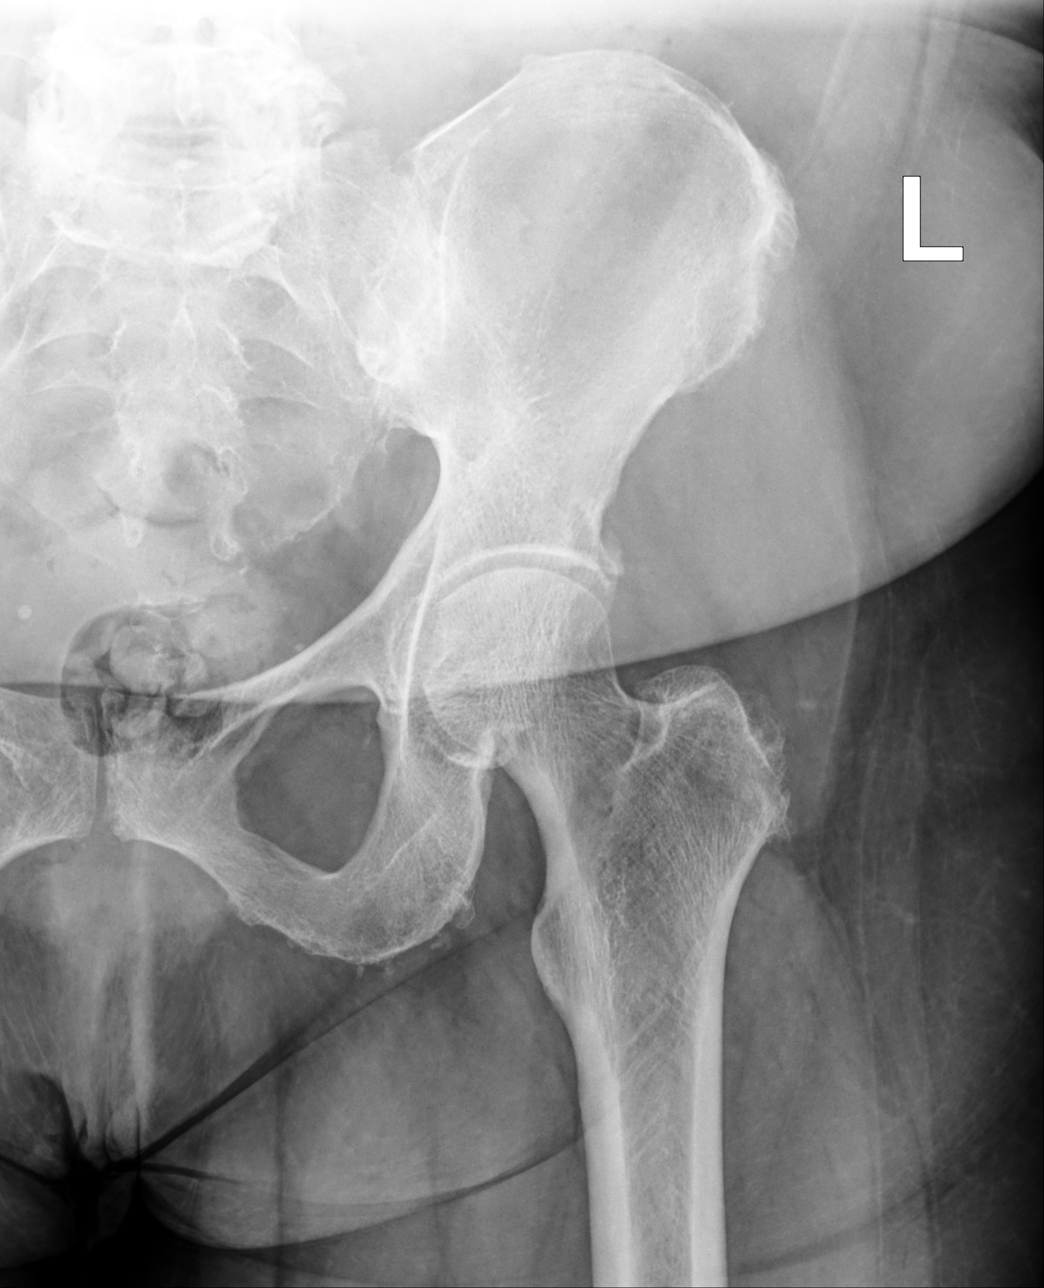

[hip joint [person_name] projection]
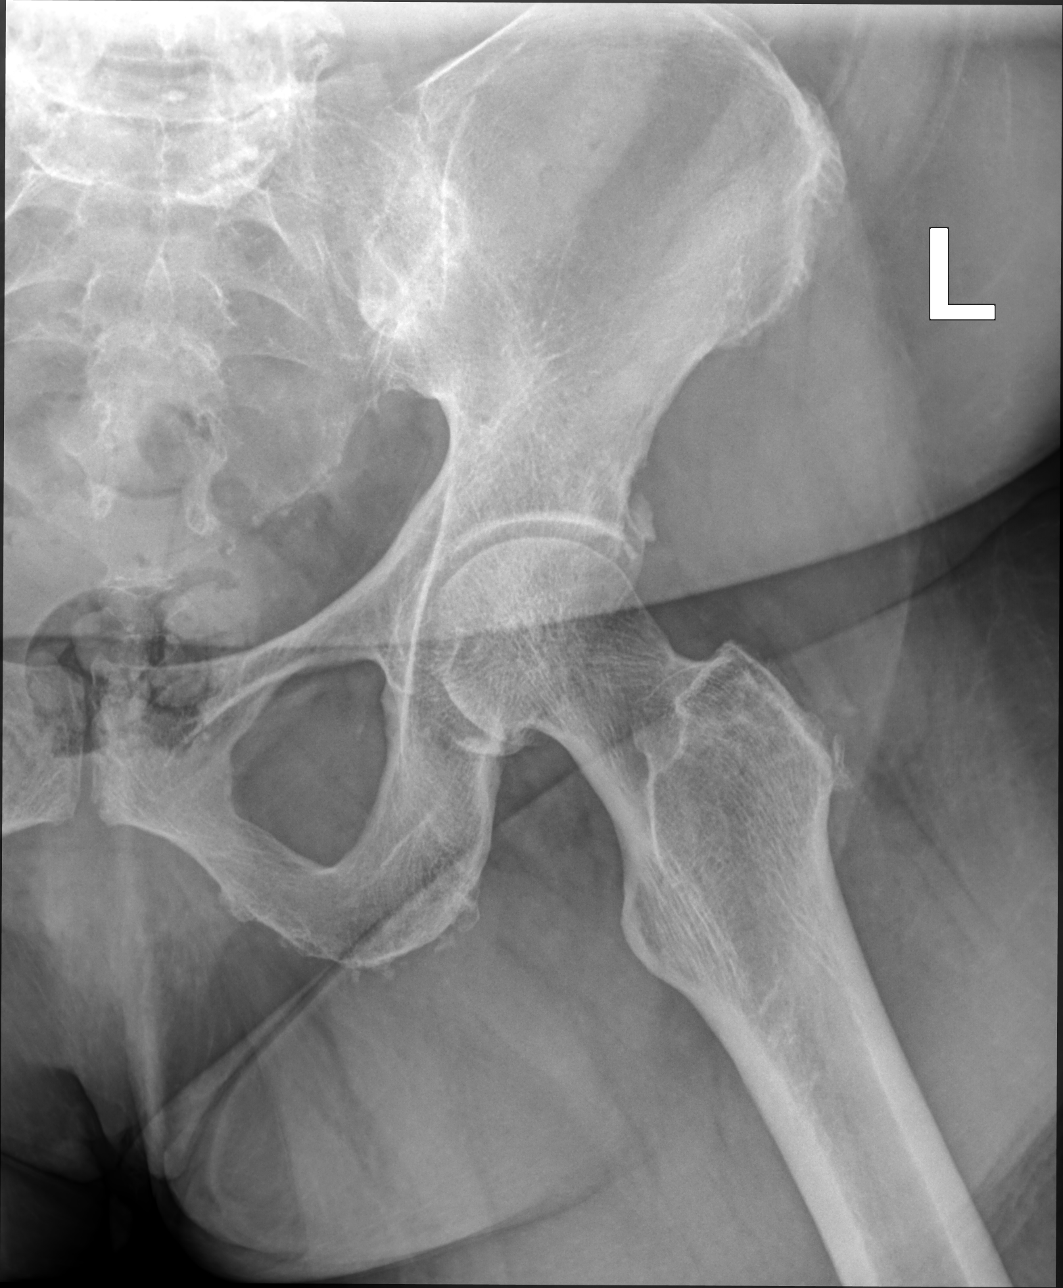

[3 of 3 positions shown; findings below may reference images not displayed]

FINDINGS: There is no evidence of hip fracture or dislocation. There is bony
spurring seen at the left anterior inferior iliac spine. There is
some enthesopathy seen at the left greater trochanter. Mild
degenerative changes with mild narrowing of the join right hip joint
space. There is no evidence of arthropathy or other focal bone
abnormality. Moderate spondylosis in the visualized lumbosacral
spine.
IMPRESSION: Mild bony spurring at the left anterior inferior iliac spine and
mild enthesopathy at the left greater trochanter. Moderate
degenerative changes in the visualized lumbosacral spine.

## 2023-01-19 DIAGNOSIS — R92313 Mammographic fatty tissue density, bilateral breasts: Secondary | ICD-10-CM | POA: Diagnosis not present

## 2023-01-19 DIAGNOSIS — Z9011 Acquired absence of right breast and nipple: Secondary | ICD-10-CM | POA: Diagnosis not present

## 2023-01-19 DIAGNOSIS — Z853 Personal history of malignant neoplasm of breast: Secondary | ICD-10-CM | POA: Diagnosis not present

## 2023-01-19 DIAGNOSIS — Z1231 Encounter for screening mammogram for malignant neoplasm of breast: Secondary | ICD-10-CM | POA: Diagnosis not present

## 2023-02-15 ENCOUNTER — Encounter: Payer: Self-pay | Admitting: Family

## 2023-02-22 ENCOUNTER — Encounter: Payer: Self-pay | Admitting: Family

## 2023-02-22 ENCOUNTER — Ambulatory Visit: Payer: Federal, State, Local not specified - PPO | Admitting: Family

## 2023-02-22 ENCOUNTER — Other Ambulatory Visit (HOSPITAL_COMMUNITY)
Admission: RE | Admit: 2023-02-22 | Discharge: 2023-02-22 | Disposition: A | Discharge status: Home / Self Care | Payer: Federal, State, Local not specified - PPO | Source: Ambulatory Visit | Attending: Family | Admitting: Family

## 2023-02-22 VITALS — BP 134/83 | HR 63 | Temp 98.7°F | Ht 64.0 in | Wt 206.4 lb

## 2023-02-22 DIAGNOSIS — R399 Unspecified symptoms and signs involving the genitourinary system: Secondary | ICD-10-CM | POA: Diagnosis not present

## 2023-02-22 DIAGNOSIS — Z853 Personal history of malignant neoplasm of breast: Secondary | ICD-10-CM | POA: Diagnosis not present

## 2023-02-22 DIAGNOSIS — Z113 Encounter for screening for infections with a predominantly sexual mode of transmission: Secondary | ICD-10-CM | POA: Diagnosis not present

## 2023-02-22 DIAGNOSIS — Z9011 Acquired absence of right breast and nipple: Secondary | ICD-10-CM

## 2023-02-22 LAB — POCT URINALYSIS DIP (CLINITEK)
Bilirubin, UA: NEGATIVE
Glucose, UA: NEGATIVE mg/dL
Ketones, POC UA: NEGATIVE mg/dL
Nitrite, UA: NEGATIVE
POC PROTEIN,UA: 30 — AB
Spec Grav, UA: 1.015 (ref 1.010–1.025)
Urobilinogen, UA: 0.2 E.U./dL
pH, UA: 7 (ref 5.0–8.0)

## 2023-02-22 NOTE — Progress Notes (Signed)
Patient ID: Kaitlyn Cervantes, female    DOB: January 17, 1961  MRN: [id number]  CC: Urinary Symptoms  Subjective: Kaitlyn Cervantes is a 62 y.o. female who presents for urinary symptoms.   Her concerns today include:  - Reports burning with urination, cloudy appearance of urine and odor. She denies additional symptoms. - Reports history of breast cancer in 2017. She had a right mastectomy in 2018. She recently had a mammogram on 01/19/2023 at [facility] which resulted normal. She requests referral to Oncology for continued management.  - Established with Cardiology. She plans to schedule an appointment soon. - Established with Endocrinology. She plans to schedule an appointment soon. - No further issues/concerns for discussion today.   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 1 kit 1   Cholecalciferol (VITAMIN D3) 5000 units TABS Take 5,000 Units by mouth daily.     Coenzyme Q10 (COQ10) 100 MG CAPS Take 1 capsule by mouth daily.     dapagliflozin propanediol (FARXIGA) 5 MG TABS tablet Take 1 tablet (5 mg total) by mouth daily before breakfast. 30 tablet 0    glucose blood (ONETOUCH VERIO) test strip Check blood sugar 3 x daily 200 each 3   Lancet Devices (ONE TOUCH DELICA LANCING DEV) MISC 1 application by Does not apply route every morning. 100 each 1   lisinopril (ZESTRIL) 40 MG tablet Take 1 tablet (40 mg total) by mouth daily. 30 tablet 0   metFORMIN (GLUCOPHAGE-XR) 500 MG 24 hr tablet Take 2 tablets (1,000 mg total) by mouth 2 (two) times daily with a meal. 120 tablet 0   Multiple Vitamin (MULTIVITAMIN WITH MINERALS) TABS tablet Take 1 tablet by mouth daily.     ONETOUCH DELICA LANCETS FINE MISC 1 application by Does not apply route 2 (two) times daily. 100 each 3   spironolactone (ALDACTONE) 25 MG tablet Take 25 mg by mouth daily.      aspirin EC 81 MG tablet Take 81 mg by mouth daily.     bumetanide (BUMEX) 1 MG tablet Take 1 mg by mouth daily.      carvedilol (COREG) 6.25 MG tablet Take 6.25 mg by mouth 2 (two) times daily with a meal.      cetirizine (ZYRTEC ALLERGY) 10 MG tablet Take 1 tablet (10 mg total) by mouth daily. (Patient not taking: Reported on 02/22/2023) 30 tablet 0   Garlic 1000 MG CAPS Take 1 capsule by mouth daily.     ipratropium (ATROVENT) 0.03 % nasal spray Place 2 sprays into both nostrils 2 (two) times daily. (Patient not taking: Reported on 02/22/2023) 30 mL 0   promethazine-dextromethorphan (PROMETHAZINE-DM) 6.25-15 MG/5ML syrup Take 5 mLs by mouth 3 (three)  times daily as needed for cough. (Patient not taking: Reported on 02/22/2023) 200 mL 0   rosuvastatin (CRESTOR) 10 MG tablet Take 1 tablet (10 mg total) by mouth daily. (Patient not taking: Reported on 11/03/2021) 90 tablet 3   Current Facility-Administered Medications on File Prior to Visit  Medication Dose Route Frequency Provider Last Rate Last Admin   0.9 %  sodium chloride infusion  500 mL Intravenous Continuous Meryl Dare, MD        Allergies  Allergen Reactions   Iohexol Hives    iodine   Other Itching and Swelling    Nuts excludes peanuts     Social History   Socioeconomic History   Marital status: Widowed    Spouse name: Not on file   Number of children: Not on file   Years of education: Not on file   Highest education level: Not on file  Occupational History   Not on file  Tobacco Use   Smoking status: Never    Passive exposure: Never   Smokeless tobacco: Never  Vaping Use   Vaping status: Never Used  Substance and Sexual Activity   Alcohol use: No    Comment: very rare   Drug use: No   Sexual activity: Not Currently    Birth control/protection: Post-menopausal    Comment: 1st intercourse- 38, partners- 1, widow  Other Topics Concern   Not on file  Social History Narrative   Not on file   Social Determinants of Health   Financial Resource Strain: Not on file  Food Insecurity: Not on file  Transportation Needs: Not on file  Physical Activity: Not on file  Stress: Not on file  Social Connections: Not on file  Intimate Partner Violence: Not on file    Family History  Problem Relation Age of Onset   Breast cancer Mother        x2 7's & 55's   Cancer Mother    Diabetes Mother    Hypertension Mother    Heart disease Father    Early death Father    Cancer Sister    Cancer Maternal Aunt    Breast cancer Maternal Aunt        mid 44's   Diabetes Brother    Depression Brother    Drug abuse Brother    Mental illness Brother    Colon polyps Neg Hx    Colon cancer Neg Hx    Esophageal cancer Neg Hx    Rectal cancer Neg Hx    Stomach cancer Neg Hx     Past Surgical History:  Procedure Laterality Date   BREAST BIOPSY     CESAREAN SECTION     x3   kidney stones  2007   right mastectomy     07/2016   SPLENECTOMY     age 53     ROS: Review of Systems Negative except as stated above  PHYSICAL EXAM: BP 134/83   Pulse 63   Temp 98.7 F (37.1 C) (Oral)   Ht 5\' 4"  (1.626 m)   Wt 206 lb 6.4 oz (93.6 kg)   SpO2 97%   BMI 35.43 kg/m   Physical Exam HENT:     Head: Normocephalic and  atraumatic.     Nose: Nose normal.     Mouth/Throat:     Mouth: Mucous membranes are moist.     Pharynx: Oropharynx is clear.  Eyes:     Extraocular Movements: Extraocular movements intact.  Conjunctiva/sclera: Conjunctivae normal.     Pupils: Pupils are equal, round, and reactive to light.  Cardiovascular:     Rate and Rhythm: Normal rate and regular rhythm.     Pulses: Normal pulses.     Heart sounds: Normal heart sounds.  Pulmonary:     Effort: Pulmonary effort is normal.     Breath sounds: Normal breath sounds.  Musculoskeletal:        General: Normal range of motion.     Cervical back: Normal range of motion and neck supple.  Neurological:     General: No focal deficit present.     Mental Status: She is alert and oriented to person, place, and time.  Psychiatric:        Mood and Affect: Mood normal.        Behavior: Behavior normal.      ASSESSMENT AND PLAN: 1. Lower urinary tract symptoms (LUTS) - Routine screening.  - POCT URINALYSIS DIP (CLINITEK); Future - Cervicovaginal ancillary only  2. History of breast cancer 3. History of right mastectomy - Referral to Hematology / Oncology for evaluation/management.  - Ambulatory referral to Hematology / Oncology   Patient was given the opportunity to ask questions.  Patient verbalized understanding of the plan and was able to repeat key elements of the plan. Patient was given clear instructions to go to Emergency Department or return to medical center if symptoms don't improve, worsen, or new problems develop.The patient verbalized understanding.   Orders Placed This Encounter  Procedures   Ambulatory referral to Hematology / Oncology   POCT URINALYSIS DIP (CLINITEK)    Follow-up with primary provider as scheduled.   Rema Fendt, NP

## 2023-02-22 NOTE — Progress Notes (Signed)
Pt is asking for referral for Oncologist  Pt wants to know about a different medication for her cholesterol. She doesn't want a statin medication.   Pt thinks she has a UTI and wants testing to be done.

## 2023-02-23 LAB — CERVICOVAGINAL ANCILLARY ONLY
Bacterial Vaginitis (gardnerella): NEGATIVE
Candida Glabrata: NEGATIVE
Candida Vaginitis: NEGATIVE
Chlamydia: NEGATIVE
Comment: NEGATIVE
Comment: NEGATIVE
Comment: NEGATIVE
Comment: NEGATIVE
Comment: NEGATIVE
Comment: NORMAL
Neisseria Gonorrhea: NEGATIVE
Trichomonas: NEGATIVE

## 2023-02-27 ENCOUNTER — Telehealth: Payer: Self-pay | Admitting: Family

## 2023-02-27 NOTE — Telephone Encounter (Signed)
Copied from CRM [id number]. Topic: General - Inquiry >> Feb 24, 2023  2:23 PM Haroldine Laws wrote: Reason for CRM: Pt called asking for Ethelene Browns and wants to let him know that she called back.  CB#  [phone]

## 2023-02-28 ENCOUNTER — Ambulatory Visit
Admission: RE | Admit: 2023-02-28 | Discharge: 2023-02-28 | Disposition: A | Discharge status: Home / Self Care | Payer: Federal, State, Local not specified - PPO | Source: Ambulatory Visit | Attending: Internal Medicine | Admitting: Internal Medicine

## 2023-02-28 ENCOUNTER — Ambulatory Visit: Payer: Federal, State, Local not specified - PPO

## 2023-02-28 VITALS — BP 189/85 | HR 66 | Temp 98.7°F | Resp 18

## 2023-02-28 DIAGNOSIS — M79642 Pain in left hand: Secondary | ICD-10-CM

## 2023-02-28 DIAGNOSIS — S6992XA Unspecified injury of left wrist, hand and finger(s), initial encounter: Secondary | ICD-10-CM | POA: Diagnosis not present

## 2023-02-28 NOTE — Discharge Instructions (Signed)
[facility] is walk-in clinic.  [facility] is on-call for hand specialty.  Either 1 is a reasonable option for follow-up.  X-ray was normal.  Apply ice and elevate extremity.

## 2023-02-28 NOTE — ED Triage Notes (Signed)
Pt slammed left hand in hatchback trunk of her car yesterday. States hand was stuck for several minutes while family attempted to release. Hand swollen. States middle finger numb

## 2023-02-28 NOTE — ED Provider Notes (Addendum)
[facility]    CSN: [id number] Arrival date & time: 02/28/23  1818      History   Chief Complaint Chief Complaint  Patient presents with   Hand Injury    HPI Kaitlyn Cervantes is a 62 y.o. female.   Patient presents today with left hand pain after injury that occurred yesterday.  Patient reports that her hand accidentally closed in her hatchback trunk door.  States that it was closed in there for approximately 3 minutes as they were trying to open the door.  Reports that she does have numbness in the distal end of her left third digit.  Majority of pain is present in the second and third digit and in the left dorsal surface of the hand.  Blood pressure is also elevated but reports that she has not taken her blood pressure medication today.  Patient not reporting chest pain, shortness of breath, headache, dizziness, blurred vision, nausea, vomiting.   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   BRCA I & II negative   Diabetes mellitus without complication (HCC)    Gallstones    has not had removed   History of chicken pox    History of stomach ulcers    treated    Hypertension    Kidney stones    Sleep apnea 2005   UTI (urinary tract infection)     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 02/25/2016   Genetic counseling and testing 11/04/2015   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/19/2015   Kidney stone 09/19/2014   Hyperlipidemia 10/30/2013   Type 2 diabetes mellitus without complication, without long-term current use of  insulin (HCC) 10/29/2013   Essential hypertension, benign 10/29/2013   History of obstructive sleep apnea 10/29/2013    Past Surgical History:  Procedure Laterality Date   BREAST BIOPSY     CESAREAN SECTION     x3   kidney stones  2007   right mastectomy     07/2016   SPLENECTOMY     age 89     OB History     Gravida  3   Para  3   Term      Preterm      AB      Living  3      SAB      IAB      Ectopic      Multiple      Live Births               Home Medications    Prior to Admission medications   Medication Sig Start Date End Date Taking? Authorizing Provider  Blood Glucose Monitoring Suppl (ONETOUCH VERIO) w/Device KIT 1 kit by Does not apply route every morning. 06/13/17  Yes Beaulah Dinning, MD  bumetanide (BUMEX) 1 MG tablet Take 1 mg by mouth daily.  07/15/16  Yes [provider]  carvedilol (COREG) 6.25 MG tablet Take 6.25 mg by mouth 2 (two) times daily with a meal.  01/01/16  Yes [provider]  Cholecalciferol (VITAMIN D3) 5000 units TABS Take 5,000 Units by mouth daily.   Yes [provider]  Coenzyme Q10 (COQ10) 100 MG CAPS Take 1 capsule by mouth daily.   Yes [provider]  Garlic 1000 MG CAPS Take 1 capsule by mouth daily.   Yes [provider]  lisinopril (ZESTRIL) 40 MG tablet Take 1 tablet (40 mg total) by mouth daily. 11/27/18  Yes Dollene [place], DO  metFORMIN (GLUCOPHAGE-XR) 500 MG 24 hr tablet Take 2 tablets (1,000 mg total) by mouth 2 (two) times daily with a meal. 01/10/22 02/28/23 Yes Zonia Kief, Amy J, NP  spironolactone (ALDACTONE) 25 MG tablet Take 25 mg by mouth daily.  01/01/16  Yes [provider]  aspirin EC 81 MG tablet Take 81 mg by mouth daily.    [provider]  cetirizine (ZYRTEC ALLERGY) 10 MG tablet Take 1 tablet (10 mg total) by mouth daily. Patient not taking: Reported on 02/22/2023 11/01/22   Wallis Bamberg, PA-C  dapagliflozin propanediol (FARXIGA) 5 MG  TABS tablet Take 1 tablet (5 mg total) by mouth daily before breakfast. 09/01/20   Rema Fendt, NP  glucose blood (ONETOUCH VERIO) test strip Check blood sugar 3 x daily 09/13/17   Beaulah Dinning, MD  ipratropium (ATROVENT) 0.03 % nasal spray Place 2 sprays into both nostrils 2 (two) times daily. Patient not taking: Reported on 02/22/2023 11/01/22   Wallis Bamberg, PA-C  Lancet Devices (ONE TOUCH DELICA LANCING DEV) MISC 1 application by Does not apply route every morning. 09/13/17   Beaulah Dinning, MD  Multiple Vitamin (MULTIVITAMIN WITH MINERALS) TABS tablet Take 1 tablet by mouth daily.    [provider]  [facility] DELICA LANCETS FINE MISC 1 application by Does not apply route 2 (two) times daily. 06/14/17   Beaulah Dinning, MD  promethazine-dextromethorphan (PROMETHAZINE-DM) 6.25-15 MG/5ML syrup Take 5 mLs by mouth 3 (three) times daily as needed for cough. Patient not taking: Reported on 02/22/2023 11/01/22   Wallis Bamberg, PA-C  rosuvastatin (CRESTOR) 10 MG tablet Take 1 tablet (10 mg total) by mouth daily. Patient not taking: Reported on 11/03/2021 09/05/17   Beaulah Dinning, MD    Family History Family History  Problem Relation Age of Onset   Breast cancer Mother        x2 71's & 56's   Cancer Mother    Diabetes Mother    Hypertension Mother    Heart disease Father    Early death Father    Cancer Sister    Cancer Maternal Aunt    Breast cancer Maternal Aunt        mid 21's   Diabetes Brother    Depression Brother    Drug abuse Brother    Mental illness Brother    Colon polyps Neg Hx    Colon cancer Neg Hx    Esophageal cancer Neg Hx    Rectal cancer Neg Hx    Stomach cancer Neg Hx     Social History Social History   Tobacco Use   Smoking status: Never    Passive exposure: Never   Smokeless tobacco: Never  Vaping Use   Vaping status: Never Used  Substance Use Topics   Alcohol use: No    Comment: very rare   Drug use: No     Allergies    Iohexol and Other   Review of Systems Review of Systems Per HPI  Physical Exam Triage Vital Signs ED  Triage Vitals  Encounter Vitals Group     BP 02/28/23 1832 (!) 189/85     Systolic BP Percentile --      Diastolic BP Percentile --      Pulse Rate 02/28/23 1832 66     Resp 02/28/23 1832 18     Temp 02/28/23 1832 98.7 F (37.1 C)     Temp Source 02/28/23 1832 Oral     SpO2 02/28/23 1832 100 %     Weight --      Height --      Head Circumference --      Peak Flow --      Pain Score 02/28/23 1830 6     Pain Loc --      Pain Education --      Exclude from Growth Chart --    No data found.  Updated Vital Signs BP (!) 189/85 (BP Location: Left Arm)   Pulse 66   Temp 98.7 F (37.1 C) (Oral)   Resp 18   SpO2 100%   Visual Acuity Right Eye Distance:   Left Eye Distance:   Bilateral Distance:    Right Eye Near:   Left Eye Near:    Bilateral Near:     Physical Exam Constitutional:      General: She is not in acute distress.    Appearance: Normal appearance. She is not toxic-appearing or diaphoretic.  HENT:     Head: Normocephalic and atraumatic.  Eyes:     Extraocular Movements: Extraocular movements intact.     Conjunctiva/sclera: Conjunctivae normal.  Pulmonary:     Effort: Pulmonary effort is normal.  Musculoskeletal:     Comments: Patient has tenderness to palpation to distal end of left second and third digit.  Very minimal superficial pinpoint abrasion present directly below nail of left third digit.  Nails are intact throughout. No subungual hematoma noted.  Full range of motion of fingers present.  No tenderness to palpation to dorsal surface of hand or thumb.  Capillary refill and pulses are intact.  Neurological:     General: No focal deficit present.     Mental Status: She is alert and oriented to person, place, and time. Mental status is at baseline.  Psychiatric:        Mood and Affect: Mood normal.        Behavior: Behavior normal.         Thought Content: Thought content normal.        Judgment: Judgment normal.      UC Treatments / Results  Labs (all labs ordered are listed, but only abnormal results are displayed) Labs Reviewed - No data to display  EKG   Radiology DG Hand Complete Left  Result Date: 02/28/2023 CLINICAL DATA:  Hand injury EXAM: LEFT HAND - COMPLETE 3+ VIEW COMPARISON:  None Available. FINDINGS: There is no evidence of fracture or dislocation. There is no evidence of arthropathy or other focal bone abnormality. There is soft tissue swelling of the third finger. IMPRESSION: No acute fracture or dislocation. Soft tissue swelling of the third finger. Electronically Signed   By: Darliss Cheney M.D.   On: 02/28/2023 19:26    Procedures Procedures (including critical care time)  Medications Ordered in UC Medications - No data to display  Initial Impression / Assessment and Plan / UC Course  I have reviewed the triage vital signs and the nursing notes.  Pertinent labs & imaging results that were available during my care  of the patient were reviewed by me and considered in my medical decision making (see chart for details).     X-ray was negative for any acute bony abnormality.  Suspect contusion causing inflammation related to mechanism of injury.  Very minimal pinpoint abrasion noted that does not need closure.  Patient declined tetanus vaccination.  Advised to monitor closely for signs of infection and follow-up sooner if this occurs.  Patient is very concerned about numbness of her fingers so encouraged follow-up with hand specialty at provided contact information.  Patient is neurovascularly intact so do not think that emergent evaluation is necessary.  Advised supportive care including elevation and ice application.  Blood pressure elevated but suspect that pain is contributing as well as patient not taking her medication.  Advised patient to monitor closely and take medication as prescribed as soon as  possible.  Advised strict return precautions regarding blood pressure and hand pain today.  Patient verbalized understanding and was agreeable with plan. Final Clinical Impressions(s) / UC Diagnoses   Final diagnoses:  Left hand pain     Discharge Instructions      [facility] is walk-in clinic.  [facility] is on-call for hand specialty.  Either 1 is a reasonable option for follow-up.  X-ray was normal.  Apply ice and elevate extremity.     ED Prescriptions   None    PDMP not reviewed this encounter.   Gustavus Bryant, [place] 02/28/23 1956    Gustavus Bryant, [place] 02/28/23 [phone]

## 2023-03-06 ENCOUNTER — Telehealth: Payer: Self-pay | Admitting: Family

## 2023-03-06 NOTE — Telephone Encounter (Signed)
Left voicemail to call office back to go over her concerns.

## 2023-03-06 NOTE — Telephone Encounter (Signed)
Talked to patient directly. Kaitlyn Cervantes, she wants you to review her U.A, states she has a UTI. Also, patient wants referral to Dr. Markham Jordan with [facility]

## 2023-03-06 NOTE — Telephone Encounter (Signed)
Copied from CRM [id number]. Topic: Referral - Status >> Mar 03, 2023  5:01 PM Macon Large wrote: Reason for CRM: Pt stated that the referral was suppose to have been sent to Dr. Jill Alexanders with [facility]. Pt also requests call back to discuss results of the  urinalysis

## 2023-03-14 ENCOUNTER — Encounter: Payer: Self-pay | Admitting: Family

## 2023-03-14 ENCOUNTER — Ambulatory Visit (INDEPENDENT_AMBULATORY_CARE_PROVIDER_SITE_OTHER): Payer: Federal, State, Local not specified - PPO | Admitting: Family

## 2023-03-14 VITALS — BP 149/76 | HR 68 | Temp 98.2°F | Ht 64.0 in | Wt 206.6 lb

## 2023-03-14 DIAGNOSIS — E1165 Type 2 diabetes mellitus with hyperglycemia: Secondary | ICD-10-CM

## 2023-03-14 DIAGNOSIS — R399 Unspecified symptoms and signs involving the genitourinary system: Secondary | ICD-10-CM | POA: Diagnosis not present

## 2023-03-14 DIAGNOSIS — Z124 Encounter for screening for malignant neoplasm of cervix: Secondary | ICD-10-CM

## 2023-03-14 DIAGNOSIS — Z6835 Body mass index (BMI) 35.0-35.9, adult: Secondary | ICD-10-CM

## 2023-03-14 DIAGNOSIS — Z0001 Encounter for general adult medical examination with abnormal findings: Secondary | ICD-10-CM | POA: Diagnosis not present

## 2023-03-14 DIAGNOSIS — Z7984 Long term (current) use of oral hypoglycemic drugs: Secondary | ICD-10-CM | POA: Diagnosis not present

## 2023-03-14 DIAGNOSIS — Z853 Personal history of malignant neoplasm of breast: Secondary | ICD-10-CM

## 2023-03-14 DIAGNOSIS — R300 Dysuria: Secondary | ICD-10-CM

## 2023-03-14 DIAGNOSIS — E119 Type 2 diabetes mellitus without complications: Secondary | ICD-10-CM

## 2023-03-14 DIAGNOSIS — R8290 Unspecified abnormal findings in urine: Secondary | ICD-10-CM | POA: Diagnosis not present

## 2023-03-14 DIAGNOSIS — Z9011 Acquired absence of right breast and nipple: Secondary | ICD-10-CM

## 2023-03-14 DIAGNOSIS — Z7689 Persons encountering health services in other specified circumstances: Secondary | ICD-10-CM

## 2023-03-14 DIAGNOSIS — Z130 Encounter for screening for diseases of the blood and blood-forming organs and certain disorders involving the immune mechanism: Secondary | ICD-10-CM

## 2023-03-14 DIAGNOSIS — Z1329 Encounter for screening for other suspected endocrine disorder: Secondary | ICD-10-CM

## 2023-03-14 DIAGNOSIS — Z0000 Encounter for general adult medical examination without abnormal findings: Secondary | ICD-10-CM

## 2023-03-14 DIAGNOSIS — Z13228 Encounter for screening for other metabolic disorders: Secondary | ICD-10-CM

## 2023-03-14 LAB — POCT GLYCOSYLATED HEMOGLOBIN (HGB A1C): HbA1c, POC (controlled diabetic range): 7.2 % — AB (ref 0.0–7.0)

## 2023-03-14 MED ORDER — EMPAGLIFLOZIN 10 MG PO TABS
10.0000 mg | ORAL_TABLET | Freq: Every day | ORAL | 1 refills | Status: DC
Start: 2023-03-14 — End: 2024-03-13

## 2023-03-14 MED ORDER — SEMAGLUTIDE-WEIGHT MANAGEMENT 0.25 MG/0.5ML ~~LOC~~ SOAJ
0.2500 mg | SUBCUTANEOUS | 0 refills | Status: DC
Start: 2023-03-14 — End: 2023-06-23

## 2023-03-14 NOTE — Progress Notes (Signed)
Patient ID: Kaitlyn Cervantes, female    DOB: [birth year]  MRN: [id number]  CC: Annual Physical Exam  Subjective: Kaitlyn Cervantes is a 62 y.o. female who presents for annual physical exam.   Her concerns today include:  - Diabetes type 2. Reports she did not follow-up with Endocrinology due to unpleasant patient experience. Reports she has not taken Metformin in over 1 year due to side effects of diarrhea. States she never took [place] because she read that it may cause urinary tract infections of which she already experiences often. She denies red flag symptoms associated with diabetes.  - Reports she would like to lose 30 to 40 pounds. States she considered beginning Wegovy. States she thinks her health insurance may be able to help with cost. - Reports since previous office visit she has been unable to schedule an appointment with Oncology. Today she is requesting a new referral and would like to be seen by Kaitlyn Cervantes.  - Reports since previous office visit urinary symptoms persisting.  - Established with Cardiology for chronic conditions management.   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     Blood  Glucose Monitoring Suppl (ONETOUCH VERIO) w/Device KIT 1 kit by Does not apply route every morning. 1 kit 1   bumetanide (BUMEX) 1 MG tablet Take 1 mg by mouth daily.      carvedilol (COREG) 6.25 MG tablet Take 6.25 mg by mouth 2 (two) times daily with a meal.      Cholecalciferol (VITAMIN D3) 5000 units TABS Take 5,000 Units by mouth daily.     Coenzyme Q10 (COQ10) 100 MG CAPS Take 1 capsule by mouth daily.     Garlic 1000 MG CAPS Take 1 capsule by mouth daily.     glucose blood (ONETOUCH VERIO) test strip Check blood sugar 3 x daily 200 each 3   ipratropium (ATROVENT) 0.03 % nasal spray Place 2 sprays into both nostrils 2 (two) times daily. 30 mL 0   Lancet Devices (ONE TOUCH DELICA LANCING DEV) MISC 1 application by Does not apply route every morning. 100 each 1   lisinopril (ZESTRIL) 40 MG tablet Take 1 tablet (40 mg total) by mouth daily. 30 tablet 0   Multiple Vitamin (MULTIVITAMIN WITH MINERALS) TABS tablet Take 1 tablet by mouth daily.     ONETOUCH DELICA LANCETS FINE MISC 1 application by Does not apply route 2 (two) times daily. 100 each 3   promethazine-dextromethorphan (PROMETHAZINE-DM) 6.25-15 MG/5ML syrup Take 5 mLs by mouth 3 (three) times daily as needed for cough. 200 mL 0   rosuvastatin (CRESTOR) 10 MG tablet Take 1 tablet (10 mg total) by mouth daily. 90 tablet 3   spironolactone (ALDACTONE) 25 MG tablet  Take 25 mg by mouth daily.      cetirizine (ZYRTEC ALLERGY) 10 MG tablet Take 1 tablet (10 mg total) by mouth daily. (Patient not taking: Reported on 02/22/2023) 30 tablet 0   Current Facility-Administered Medications on File Prior to Visit  Medication Dose Route Frequency Provider Last Rate Last Admin   0.9 %  sodium chloride infusion  500 mL Intravenous Continuous Meryl Dare, MD        Allergies  Allergen Reactions   Iohexol Hives    iodine   Other Itching and Swelling    Nuts excludes peanuts    Social History   Socioeconomic History   Marital status:  Widowed    Spouse name: Not on file   Number of children: Not on file   Years of education: Not on file   Highest education level: Not on file  Occupational History   Not on file  Tobacco Use   Smoking status: Never    Passive exposure: Never   Smokeless tobacco: Never  Vaping Use   Vaping status: Never Used  Substance and Sexual Activity   Alcohol use: No    Comment: very rare   Drug use: No   Sexual activity: Not Currently    Birth control/protection: Post-menopausal    Comment: 1st intercourse- 80, partners- 1, widow  Other Topics Concern   Not on file  Social History Narrative   Not on file   Social Determinants of Health   Financial Resource Strain: Not on file  Food Insecurity: Not on file  Transportation Needs: Not on file  Physical Activity: Not on file  Stress: Not on file  Social Connections: Not on file  Intimate Partner Violence: Not on file    Family History  Problem Relation Age of Onset   Breast cancer Mother        x2 64's & 84's   Cancer Mother    Diabetes Mother    Hypertension Mother    Heart disease Father    Early death Father    Cancer Sister    Cancer Maternal Aunt    Breast cancer Maternal Aunt        mid 100's   Diabetes Brother    Depression Brother    Drug abuse Brother    Mental illness Brother    Colon polyps Neg Hx    Colon cancer Neg Hx    Esophageal cancer Neg Hx    Rectal cancer Neg Hx    Stomach cancer Neg Hx     Past Surgical History:  Procedure Laterality Date   BREAST BIOPSY     CESAREAN SECTION     x3   kidney stones  2007   right mastectomy     07/2016   SPLENECTOMY     age 47     ROS: Review of Systems Negative except as stated above  PHYSICAL EXAM: BP (!) 149/76   Pulse 68   Temp 98.2 F (36.8 C) (Oral)   Ht 5\' 4"  (1.626 m)   Wt 206 lb 9.6 oz (93.7 kg)   SpO2 95%   BMI 35.46 kg/m   Physical Exam HENT:     Head: Normocephalic and atraumatic.     Right Ear: Tympanic membrane, ear canal and  external ear normal.     Left Ear: Tympanic membrane, ear canal and external ear normal.     Nose: Nose normal.     Mouth/Throat:     Mouth: Mucous membranes  are moist.     Pharynx: Oropharynx is clear.  Eyes:     Extraocular Movements: Extraocular movements intact.     Conjunctiva/sclera: Conjunctivae normal.     Pupils: Pupils are equal, round, and reactive to light.  Cardiovascular:     Rate and Rhythm: Normal rate and regular rhythm.     Pulses: Normal pulses.     Heart sounds: Normal heart sounds.  Pulmonary:     Effort: Pulmonary effort is normal.     Breath sounds: Normal breath sounds.  Chest:     Comments: Patient declined.  Abdominal:     General: Bowel sounds are normal.     Palpations: Abdomen is soft.  Genitourinary:    Comments: Patient declined.  Musculoskeletal:        General: Normal range of motion.     Right shoulder: Normal.     Left shoulder: Normal.     Right upper arm: Normal.     Left upper arm: Normal.     Right elbow: Normal.     Left elbow: Normal.     Right forearm: Normal.     Left forearm: Normal.     Right wrist: Normal.     Left wrist: Normal.     Right hand: Normal.     Left hand: Normal.     Cervical back: Normal, normal range of motion and neck supple.     Thoracic back: Normal.     Lumbar back: Normal.     Right hip: Normal.     Left hip: Normal.     Right upper leg: Normal.     Left upper leg: Normal.     Right knee: Normal.     Left knee: Normal.     Right lower leg: Normal.     Left lower leg: Normal.     Right ankle: Normal.     Left ankle: Normal.     Right foot: Normal.     Left foot: Normal.  Skin:    General: Skin is warm and dry.     Capillary Refill: Capillary refill takes less than 2 seconds.  Neurological:     General: No focal deficit present.     Mental Status: She is alert and oriented to person, place, and time.  Psychiatric:        Mood and Affect: Mood normal.        Behavior: Behavior normal.      Results for orders placed or performed in visit on 03/14/23  POCT glycosylated hemoglobin (Hb A1C)  Result Value Ref Range   Hemoglobin A1C     HbA1c POC (<> result, manual entry)     HbA1c, POC (prediabetic range)     HbA1c, POC (controlled diabetic range) 7.2 (A) 0.0 - 7.0 %    ASSESSMENT AND PLAN: 1. Annual physical exam - Counseled on 150 minutes of exercise per week as tolerated, healthy eating (including decreased daily intake of saturated fats, cholesterol, added sugars, sodium), STI prevention, and routine healthcare maintenance.  2. Screening for metabolic disorder - Routine screening.  - CMP14+EGFR  3. Screening for deficiency anemia - Routine screening.  - CBC  4. Thyroid disorder screen - Routine screening.  - TSH  5. Type 2 diabetes mellitus with hyperglycemia, without long-term current use of insulin (HCC) - Hemoglobin A1c relative to goal at 7.2%, goal 7%. This is improved compared to previous 9.4%.  - Metformin XR and Farxiga discontinued related to side effects.  -  Trial Empagliflozin as prescribed. Counseled on medication adherence/adverse effects.  - Routine screening.  - Discussed the importance of healthy eating habits, low-carbohydrate diet, low-sugar diet, regular aerobic exercise (at least 150 minutes a week as tolerated) and medication compliance to achieve or maintain control of diabetes. - Follow-up with primary provider in 4 weeks or sooner if needed.  - POCT glycosylated hemoglobin (Hb A1C) - empagliflozin (JARDIANCE) 10 MG TABS tablet; Take 1 tablet (10 mg total) by mouth daily before breakfast.  Dispense: 30 tablet; Refill: 1 - Microalbumin / creatinine urine ratio  6. Diabetic eye exam [facility] [place]) - Referral to Ophthalmology for further evaluation/management. - Ambulatory referral to Ophthalmology  7. Encounter for diabetic foot exam [facility]) - Referral to Podiatry for further evaluation/management. - Ambulatory referral to Podiatry  8.  History of breast cancer 9. History of right mastectomy - Referral to Hematology / Oncology for evaluation/management.  - Ambulatory referral to Hematology / Oncology  10. Pap smear for cervical cancer screening - Referral to Gynecology for evaluation/management. - Ambulatory referral to Gynecology  11. Lower urinary tract symptoms (LUTS) 12. Cloudy urine 13. Abnormal urine odor 14. Burning with urination - Referral to Urology for evaluation/management.  - Ambulatory referral to Urology  15. Encounter for weight management 16. BMI 35.0-35.9,adult - Semaglutide-Weight Management as prescribed.  - Counseled on low-sodium, DASH diet, and 150 minutes of moderate intensity exercise per week as tolerated. Counseled on medication adherence and adverse effects. - Follow-up with primary provider in 4 weeks or sooner if needed.  - Semaglutide-Weight Management 0.25 MG/0.5ML SOAJ; Inject 0.25 mg into the skin once a week.  Dispense: 2 mL; Refill: 0   Patient was given the opportunity to ask questions.  Patient verbalized understanding of the plan and was able to repeat key elements of the plan. Patient was given clear instructions to go to Emergency Department or return to medical center if symptoms don't improve, worsen, or new problems develop.The patient verbalized understanding.   Orders Placed This Encounter  Procedures   CMP14+EGFR   TSH   CBC   Microalbumin / creatinine urine ratio   Ambulatory referral to Gynecology   Ambulatory referral to Podiatry   Ambulatory referral to Ophthalmology   Ambulatory referral to Hematology / Oncology   Ambulatory referral to Urology   POCT glycosylated hemoglobin (Hb A1C)     Requested Prescriptions   Signed Prescriptions Disp Refills   empagliflozin (JARDIANCE) 10 MG TABS tablet 30 tablet 1    Sig: Take 1 tablet (10 mg total) by mouth daily before breakfast.   Semaglutide-Weight Management 0.25 MG/0.5ML SOAJ 2 mL 0    Sig: Inject 0.25 mg  into the skin once a week.    Return in about 1 year (around 03/13/2024) for Physical per patient preference, Follow-Up or next available 4 weeks diabetes/weight check.  Rema Fendt, NP

## 2023-03-14 NOTE — Patient Instructions (Signed)
Preventive Care 40-64 Years Old, Female Preventive care refers to lifestyle choices and visits with your health care provider that can promote health and wellness. Preventive care visits are also called wellness exams. What can I expect for my preventive care visit? Counseling Your health care provider may ask you questions about your: Medical history, including: Past medical problems. Family medical history. Pregnancy history. Current health, including: Menstrual cycle. Method of birth control. Emotional well-being. Home life and relationship well-being. Sexual activity and sexual health. Lifestyle, including: Alcohol, nicotine or tobacco, and drug use. Access to firearms. Diet, exercise, and sleep habits. Work and work environment. Sunscreen use. Safety issues such as seatbelt and bike helmet use. Physical exam Your health care provider will check your: Height and weight. These may be used to calculate your BMI (body mass index). BMI is a measurement that tells if you are at a healthy weight. Waist circumference. This measures the distance around your waistline. This measurement also tells if you are at a healthy weight and may help predict your risk of certain diseases, such as type 2 diabetes and high blood pressure. Heart rate and blood pressure. Body temperature. Skin for abnormal spots. What immunizations do I need?  Vaccines are usually given at various ages, according to a schedule. Your health care provider will recommend vaccines for you based on your age, medical history, and lifestyle or other factors, such as travel or where you work. What tests do I need? Screening Your health care provider may recommend screening tests for certain conditions. This may include: Lipid and cholesterol levels. Diabetes screening. This is done by checking your blood sugar (glucose) after you have not eaten for a while (fasting). Pelvic exam and Pap test. Hepatitis B test. Hepatitis C  test. HIV (human immunodeficiency virus) test. STI (sexually transmitted infection) testing, if you are at risk. Lung cancer screening. Colorectal cancer screening. Mammogram. Talk with your health care provider about when you should start having regular mammograms. This may depend on whether you have a family history of breast cancer. BRCA-related cancer screening. This may be done if you have a family history of breast, ovarian, tubal, or peritoneal cancers. Bone density scan. This is done to screen for osteoporosis. Talk with your health care provider about your test results, treatment options, and if necessary, the need for more tests. Follow these instructions at home: Eating and drinking  Eat a diet that includes fresh fruits and vegetables, whole grains, lean protein, and low-fat dairy products. Take vitamin and mineral supplements as recommended by your health care provider. Do not drink alcohol if: Your health care provider tells you not to drink. You are pregnant, may be pregnant, or are planning to become pregnant. If you drink alcohol: Limit how much you have to 0-1 drink a day. Know how much alcohol is in your drink. In the [place] one drink equals one 12 oz bottle of beer (355 mL), one 5 oz glass of wine (148 mL), or one 1 oz glass of hard liquor (44 mL). Lifestyle Brush your teeth every morning and night with fluoride toothpaste. Floss one time each day. Exercise for at least 30 minutes 5 or more days each week. Do not use any products that contain nicotine or tobacco. These products include cigarettes, chewing tobacco, and vaping devices, such as e-cigarettes. If you need help quitting, ask your health care provider. Do not use drugs. If you are sexually active, practice safe sex. Use a condom or other form of protection to   prevent STIs. If you do not wish to become pregnant, use a form of birth control. If you plan to become pregnant, see your health care provider for a  prepregnancy visit. Take aspirin only as told by your health care provider. Make sure that you understand how much to take and what form to take. Work with your health care provider to find out whether it is safe and beneficial for you to take aspirin daily. Find healthy ways to manage stress, such as: Meditation, yoga, or listening to music. Journaling. Talking to a trusted person. Spending time with friends and family. Minimize exposure to UV radiation to reduce your risk of skin cancer. Safety Always wear your seat belt while driving or riding in a vehicle. Do not drive: If you have been drinking alcohol. Do not ride with someone who has been drinking. When you are tired or distracted. While texting. If you have been using any mind-altering substances or drugs. Wear a helmet and other protective equipment during sports activities. If you have firearms in your house, make sure you follow all gun safety procedures. Seek help if you have been physically or sexually abused. What's next? Visit your health care provider once a year for an annual wellness visit. Ask your health care provider how often you should have your eyes and teeth checked. Stay up to date on all vaccines. This information is not intended to replace advice given to you by your health care provider. Make sure you discuss any questions you have with your health care provider. Document Revised: 12/23/2020 Document Reviewed: 12/23/2020 Elsevier Patient Education  2024 Elsevier Inc.  

## 2023-03-14 NOTE — Progress Notes (Signed)
Pt wants to speak to you directly

## 2023-03-15 LAB — MICROALBUMIN / CREATININE URINE RATIO
Creatinine, Urine: 104.8 mg/dL
Microalb/Creat Ratio: 150 mg/g{creat} — ABNORMAL HIGH (ref 0–29)
Microalbumin, Urine: 157.4 ug/mL

## 2023-03-16 ENCOUNTER — Telehealth: Payer: Self-pay | Admitting: Family

## 2023-03-16 ENCOUNTER — Other Ambulatory Visit: Payer: Self-pay

## 2023-03-16 NOTE — Telephone Encounter (Signed)
Good morning Tresa Endo can you help me out with this prior authorization please.   ZOXWRU 0.25MG /0.5ML Auto-injectors

## 2023-03-17 ENCOUNTER — Telehealth: Payer: Self-pay

## 2023-03-17 NOTE — Telephone Encounter (Signed)
Pharmacy Patient Advocate Encounter   Received notification from Physician's Office that prior authorization for [facility] is required/requested.   Insurance verification completed.   The patient is insured through [facility] .   Per test claim: PA SUBMITTED TODAY VIA [employer]OVER[employer]MEDS KEY [id number]

## 2023-03-20 ENCOUNTER — Other Ambulatory Visit: Payer: Self-pay

## 2023-03-23 ENCOUNTER — Inpatient Hospital Stay: Payer: Federal, State, Local not specified - PPO

## 2023-03-23 ENCOUNTER — Inpatient Hospital Stay
Payer: Federal, State, Local not specified - PPO | Attending: Hematology and Oncology | Admitting: Hematology and Oncology

## 2023-03-23 VITALS — BP 138/62 | HR 71 | Temp 97.8°F | Resp 18 | Ht 64.0 in | Wt 201.7 lb

## 2023-03-23 DIAGNOSIS — Z79899 Other long term (current) drug therapy: Secondary | ICD-10-CM | POA: Diagnosis not present

## 2023-03-23 DIAGNOSIS — C50211 Malignant neoplasm of upper-inner quadrant of right female breast: Secondary | ICD-10-CM | POA: Insufficient documentation

## 2023-03-23 DIAGNOSIS — Z803 Family history of malignant neoplasm of breast: Secondary | ICD-10-CM | POA: Insufficient documentation

## 2023-03-23 DIAGNOSIS — Z833 Family history of diabetes mellitus: Secondary | ICD-10-CM | POA: Diagnosis not present

## 2023-03-23 DIAGNOSIS — Z809 Family history of malignant neoplasm, unspecified: Secondary | ICD-10-CM | POA: Diagnosis not present

## 2023-03-23 DIAGNOSIS — Z818 Family history of other mental and behavioral disorders: Secondary | ICD-10-CM | POA: Diagnosis not present

## 2023-03-23 DIAGNOSIS — Z170 Estrogen receptor positive status [ER+]: Secondary | ICD-10-CM | POA: Insufficient documentation

## 2023-03-23 DIAGNOSIS — Z87440 Personal history of urinary (tract) infections: Secondary | ICD-10-CM | POA: Diagnosis not present

## 2023-03-23 DIAGNOSIS — Z8249 Family history of ischemic heart disease and other diseases of the circulatory system: Secondary | ICD-10-CM | POA: Diagnosis not present

## 2023-03-23 DIAGNOSIS — E119 Type 2 diabetes mellitus without complications: Secondary | ICD-10-CM | POA: Insufficient documentation

## 2023-03-23 LAB — CBC WITH DIFFERENTIAL (CANCER CENTER ONLY)
Abs Immature Granulocytes: 0.01 10*3/uL (ref 0.00–0.07)
Basophils Absolute: 0 10*3/uL (ref 0.0–0.1)
Basophils Relative: 0 %
Eosinophils Absolute: 0.1 10*3/uL (ref 0.0–0.5)
Eosinophils Relative: 3 %
HCT: 34.1 % — ABNORMAL LOW (ref 36.0–46.0)
Hemoglobin: 10.9 g/dL — ABNORMAL LOW (ref 12.0–15.0)
Immature Granulocytes: 0 %
Lymphocytes Relative: 36 %
Lymphs Abs: 1.7 10*3/uL (ref 0.7–4.0)
MCH: 26.1 pg (ref 26.0–34.0)
MCHC: 32 g/dL (ref 30.0–36.0)
MCV: 81.8 fL (ref 80.0–100.0)
Monocytes Absolute: 0.5 10*3/uL (ref 0.1–1.0)
Monocytes Relative: 11 %
Neutro Abs: 2.4 10*3/uL (ref 1.7–7.7)
Neutrophils Relative %: 50 %
Platelet Count: 293 10*3/uL (ref 150–400)
RBC: 4.17 MIL/uL (ref 3.87–5.11)
RDW: 14.5 % (ref 11.5–15.5)
WBC Count: 4.8 10*3/uL (ref 4.0–10.5)
nRBC: 0 % (ref 0.0–0.2)

## 2023-03-23 LAB — CMP (CANCER CENTER ONLY)
ALT: 16 U/L (ref 0–44)
AST: 17 U/L (ref 15–41)
Albumin: 3.8 g/dL (ref 3.5–5.0)
Alkaline Phosphatase: 85 U/L (ref 38–126)
Anion gap: 7 (ref 5–15)
BUN: 28 mg/dL — ABNORMAL HIGH (ref 8–23)
CO2: 25 mmol/L (ref 22–32)
Calcium: 9.7 mg/dL (ref 8.9–10.3)
Chloride: 104 mmol/L (ref 98–111)
Creatinine: 1.42 mg/dL — ABNORMAL HIGH (ref 0.44–1.00)
GFR, Estimated: 42 mL/min — ABNORMAL LOW (ref >60)
Glucose, Bld: 141 mg/dL — ABNORMAL HIGH (ref 70–99)
Potassium: 3.9 mmol/L (ref 3.5–5.1)
Sodium: 136 mmol/L (ref 135–145)
Total Bilirubin: 0.4 mg/dL (ref 0.3–1.2)
Total Protein: 8 g/dL (ref 6.5–8.1)

## 2023-03-23 NOTE — Assessment & Plan Note (Signed)
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  But it looks like patient has not had it done.

## 2023-03-23 NOTE — Progress Notes (Signed)
[facility] CONSULT NOTE  Patient Care Team: Rema Fendt, NP as PCP - General (Nurse Practitioner) Magrinat, Valentino Hue, MD (Inactive) as Consulting Physician (Oncology) Griselda Miner, MD as Consulting Physician (General Surgery) Antony Blackbird, MD as Consulting Physician (Radiation Oncology) Salomon Fick, NP as Nurse Practitioner (Hematology and Oncology) Ihor Gully, MD (Inactive) as Consulting Physician (Urology)  CHIEF COMPLAINTS/PURPOSE OF CONSULTATION:  Prior history of breast cancer  HISTORY OF PRESENTING ILLNESS:  Kaitlyn Cervantes 62 y.o. female is here because of recent diagnosis of prior history of breast cancer.  She was originally seen by Dr. Darnelle Catalan who recommended neoadjuvant chemotherapy followed by surgery and radiation but patient decided to go to [facility].  She received neoadjuvant chemotherapy over there followed by mastectomy and did not receive radiation.  She has been followed by Dr. Lenise Herald for a long time but not lately.  She came in today to [place] because she was experiencing abdominal pain and back pain issues and wanted to make sure that she does not have a recurrence.  I reviewed her records extensively and collaborated the history with the patient.  SUMMARY OF ONCOLOGIC HISTORY: Oncology History   No history exists.     MEDICAL HISTORY:  Past Medical History:  Diagnosis Date   Anemia 2009   Blood transfusion without reported diagnosis 2009   Breast cancer of upper-inner quadrant of right female breast (HCC) 10/19/2015   BRCA I & II negative   Diabetes mellitus without complication (HCC)    Gallstones    has not had removed   History of chicken pox    History of stomach ulcers    treated    Hypertension    Kidney stones    Sleep apnea 2005   UTI (urinary tract infection)     SURGICAL HISTORY: Past Surgical History:  Procedure Laterality Date   BREAST BIOPSY     CESAREAN SECTION     x3   kidney stones  2007    right mastectomy     07/2016   SPLENECTOMY     age 51     SOCIAL HISTORY: Social History   Socioeconomic History   Marital status: Widowed    Spouse name: Not on file   Number of children: Not on file   Years of education: Not on file   Highest education level: Not on file  Occupational History   Not on file  Tobacco Use   Smoking status: Never    Passive exposure: Never   Smokeless tobacco: Never  Vaping Use   Vaping status: Never Used  Substance and Sexual Activity   Alcohol use: No    Comment: very rare   Drug use: No   Sexual activity: Not Currently    Birth control/protection: Post-menopausal    Comment: 1st intercourse- 20, partners- 1, widow  Other Topics Concern   Not on file  Social History Narrative   Not on file   Social Determinants of Health   Financial Resource Strain: Not on file  Food Insecurity: Not on file  Transportation Needs: Not on file  Physical Activity: Not on file  Stress: Not on file  Social Connections: Not on file  Intimate Partner Violence: Not on file    FAMILY HISTORY: Family History  Problem Relation Age of Onset   Breast cancer Mother        x2 53's & 72's   Cancer Mother    Diabetes Mother    Hypertension  Mother    Heart disease Father    Early death Father    Cancer Sister    Cancer Maternal Aunt    Breast cancer Maternal Aunt        mid 50's   Diabetes Brother    Depression Brother    Drug abuse Brother    Mental illness Brother    Colon polyps Neg Hx    Colon cancer Neg Hx    Esophageal cancer Neg Hx    Rectal cancer Neg Hx    Stomach cancer Neg Hx     ALLERGIES:  is allergic to iohexol and other.  MEDICATIONS:  Current Outpatient Medications  Medication Sig Dispense Refill   aspirin EC 81 MG tablet Take 81 mg by mouth daily.     Blood Glucose Monitoring Suppl (ONETOUCH VERIO) w/Device KIT 1 kit by Does not apply route every morning. 1 kit 1   bumetanide (BUMEX) 1 MG tablet Take 1 mg by mouth daily.       carvedilol (COREG) 6.25 MG tablet Take 6.25 mg by mouth 2 (two) times daily with a meal.      cetirizine (ZYRTEC ALLERGY) 10 MG tablet Take 1 tablet (10 mg total) by mouth daily. (Patient not taking: Reported on 02/22/2023) 30 tablet 0   Cholecalciferol (VITAMIN D3) 5000 units TABS Take 5,000 Units by mouth daily.     Coenzyme Q10 (COQ10) 100 MG CAPS Take 1 capsule by mouth daily.     empagliflozin (JARDIANCE) 10 MG TABS tablet Take 1 tablet (10 mg total) by mouth daily before breakfast. 30 tablet 1   Garlic 1000 MG CAPS Take 1 capsule by mouth daily.     glucose blood (ONETOUCH VERIO) test strip Check blood sugar 3 x daily 200 each 3   ipratropium (ATROVENT) 0.03 % nasal spray Place 2 sprays into both nostrils 2 (two) times daily. 30 mL 0   Lancet Devices (ONE TOUCH DELICA LANCING DEV) MISC 1 application by Does not apply route every morning. 100 each 1   lisinopril (ZESTRIL) 40 MG tablet Take 1 tablet (40 mg total) by mouth daily. 30 tablet 0   Multiple Vitamin (MULTIVITAMIN WITH MINERALS) TABS tablet Take 1 tablet by mouth daily.     ONETOUCH DELICA LANCETS FINE MISC 1 application by Does not apply route 2 (two) times daily. 100 each 3   promethazine-dextromethorphan (PROMETHAZINE-DM) 6.25-15 MG/5ML syrup Take 5 mLs by mouth 3 (three) times daily as needed for cough. 200 mL 0   rosuvastatin (CRESTOR) 10 MG tablet Take 1 tablet (10 mg total) by mouth daily. 90 tablet 3   Semaglutide-Weight Management 0.25 MG/0.5ML SOAJ Inject 0.25 mg into the skin once a week. 2 mL 0   spironolactone (ALDACTONE) 25 MG tablet Take 25 mg by mouth daily.      Current Facility-Administered Medications  Medication Dose Route Frequency Provider Last Rate Last Admin   0.9 %  sodium chloride infusion  500 mL Intravenous Continuous Meryl Dare, MD        REVIEW OF SYSTEMS:   Constitutional: Denies fevers, chills or abnormal night sweats   All other systems were reviewed with the patient and are  negative.  PHYSICAL EXAMINATION: ECOG PERFORMANCE STATUS: 1 - Symptomatic but completely ambulatory  Vitals:   03/23/23 1329  BP: 138/62  Pulse: 71  Resp: 18  Temp: 97.8 F (36.6 C)  SpO2: 100%   Filed Weights   03/23/23 1329  Weight: 201 lb 11.2 oz (91.5  kg)    GENERAL:alert, no distress and comfortable    LABORATORY DATA:  I have reviewed the data as listed Lab Results  Component Value Date   WBC 4.9 02/10/2022   HGB 10.8 (L) 02/10/2022   HCT 33.8 (L) 02/10/2022   MCV 81 02/10/2022   PLT 302 02/10/2022   Lab Results  Component Value Date   NA 142 11/03/2021   K 4.1 11/03/2021   CL 102 11/03/2021   CO2 25 11/03/2021    RADIOGRAPHIC STUDIES: I have personally reviewed the radiological reports and agreed with the findings in the report.  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  But it looks like patient has not had it done.   Malignant neoplasm of upper-inner quadrant of right female breast [facility]) Previously followed with [facility] oncology with Dr. Mayra Neer. 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  We will also obtain CBC CMP.  I discussed with her about Signatera for minimal residual disease monitoring. She would like to think about it and let us know.  Follow-up a week after the CT scans to discuss results.  All questions were answered. The patient knows to  call the clinic with any problems, questions or concerns.    Tamsen Meek, MD 03/23/23

## 2023-03-23 NOTE — Assessment & Plan Note (Addendum)
Previously followed with [facility] oncology with Dr. Mayra Neer. 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  We will also obtain CBC CMP.  I discussed with her about Signatera for minimal residual disease monitoring. She would like to think about it and let us know.  Follow-up a week after the CT scans to discuss results.

## 2023-03-24 ENCOUNTER — Telehealth: Payer: Self-pay

## 2023-03-24 ENCOUNTER — Other Ambulatory Visit: Payer: Self-pay

## 2023-03-24 NOTE — Telephone Encounter (Signed)
Good after noon Tresa Endo, can you help me out with this prior auth please?  Jardiance 10MG  Tablets

## 2023-03-24 NOTE — Telephone Encounter (Signed)
Pharmacy Patient Advocate Encounter   Received notification from Physician's Office that prior authorization for JARDIANCE is required/requested.   Insurance verification completed.   The patient is insured through  [facility] [employer]  .   Per test claim: PA required; PA started via CoverMyMeds. KEY [id number] . Waiting for clinical questions to populate.

## 2023-03-27 ENCOUNTER — Telehealth: Payer: Self-pay

## 2023-03-27 ENCOUNTER — Telehealth: Payer: Self-pay | Admitting: Family

## 2023-03-27 ENCOUNTER — Other Ambulatory Visit: Payer: Self-pay

## 2023-03-27 ENCOUNTER — Ambulatory Visit: Payer: Self-pay | Admitting: *Deleted

## 2023-03-27 NOTE — Telephone Encounter (Signed)
Placed call to patient twice to update regarding [employer] prior authorization denial and no answer, voicemail full.

## 2023-03-27 NOTE — Telephone Encounter (Signed)
Pharmacy Patient Advocate Encounter  Received notification from [facility] [employer] that Prior Authorization for JARDIANCE has been APPROVED from 02/25/2023 to 03/26/2024   PA #/Case ID/Reference #: [id number]

## 2023-03-27 NOTE — Telephone Encounter (Signed)
Pharmacy Patient Advocate Encounter  Received notification from [facility] [employer] that Prior Authorization for [facility] has been DENIED.  Full denial letter will be uploaded to the media tab. See denial reason below.   PA #/Case ID/Reference #: [id number]  Reason:

## 2023-03-27 NOTE — Telephone Encounter (Signed)
Pt is calling to check on the status of PA of Semaglutide-Weight Management 0.25 MG/0.5ML SOAJ [[id number]] & empagliflozin (JARDIANCE) 10 MG TABS tablet [[id number]] Please advise [phone]

## 2023-03-27 NOTE — Telephone Encounter (Signed)
Pt hung up before agent got her transferred to me.  I called her back.  Got a message that her voice mailbox was full so unable to leave a message.   The latest labs from 03/23/2023 are from the cancer center not Primary Care at [facility].     Reason for Disposition  [1] Follow-up call to recent contact AND [2] information only call, no triage required  Answer Assessment - Initial Assessment Questions 1. REASON FOR CALL or QUESTION: "What is your reason for calling today?" or "How can I best help you?" or "What question do you have that I can help answer?"     Pt hung up before agent got her transferred to me.   I attempted to call her back but her voice mailbox was full so unable to leave a message.  The latest labs from 03/23/2023 are from the Cancer Center not Primary Care at [facility].  Protocols used: Information Only Call - No Triage-A-AH

## 2023-03-31 ENCOUNTER — Other Ambulatory Visit: Payer: Self-pay

## 2023-03-31 ENCOUNTER — Other Ambulatory Visit: Payer: Self-pay | Admitting: *Deleted

## 2023-03-31 MED ORDER — PREDNISONE 50 MG PO TABS: 50.0000 mg | ORAL_TABLET | ORAL | 1 refills | Status: DC

## 2023-03-31 MED ORDER — PREDNISONE 50 MG PO TABS: ORAL_TABLET | ORAL | 1 refills | Status: DC

## 2023-03-31 NOTE — Telephone Encounter (Signed)
Pt called stating need for premeds for scans due to dye allergy.   Attempted to return call- obtain no identification per VM with statement the mail box is full.  This RN sent prescription to most recently used pharmacy.

## 2023-04-05 ENCOUNTER — Telehealth: Payer: Self-pay

## 2023-04-05 NOTE — Telephone Encounter (Signed)
Good after noon,  Can you help me out with this prior auth for [facility] 0.25MG /0.5ML

## 2023-04-06 ENCOUNTER — Other Ambulatory Visit: Payer: Self-pay

## 2023-04-06 NOTE — Telephone Encounter (Signed)
Noted  

## 2023-04-07 NOTE — Telephone Encounter (Signed)
Attempted to call pt. but got voicemail. Wanted to advise pt about her next Wednesday appt. She will be having imaging done with contrast and needs to prep for 13 hrs. Left message for pt to return call back to receive information.

## 2023-04-10 ENCOUNTER — Telehealth: Payer: Self-pay

## 2023-04-10 NOTE — Telephone Encounter (Signed)
Pt called and LVM regarding authorization for CT scan on 10/3.  Spoke with Brandi and the referral is approved and no PA is required.   Left pt a VM in regards to above.

## 2023-04-11 ENCOUNTER — Ambulatory Visit: Payer: Federal, State, Local not specified - PPO | Admitting: Family

## 2023-04-13 ENCOUNTER — Ambulatory Visit (HOSPITAL_COMMUNITY)
Admission: RE | Admit: 2023-04-13 | Discharge: 2023-04-13 | Disposition: A | Discharge status: Home / Self Care | Payer: Federal, State, Local not specified - PPO | Source: Ambulatory Visit | Attending: Hematology and Oncology | Admitting: Hematology and Oncology

## 2023-04-13 DIAGNOSIS — Z170 Estrogen receptor positive status [ER+]: Secondary | ICD-10-CM | POA: Diagnosis not present

## 2023-04-13 DIAGNOSIS — N200 Calculus of kidney: Secondary | ICD-10-CM | POA: Diagnosis not present

## 2023-04-13 DIAGNOSIS — R109 Unspecified abdominal pain: Secondary | ICD-10-CM | POA: Diagnosis not present

## 2023-04-13 DIAGNOSIS — C50211 Malignant neoplasm of upper-inner quadrant of right female breast: Secondary | ICD-10-CM | POA: Insufficient documentation

## 2023-04-13 DIAGNOSIS — E049 Nontoxic goiter, unspecified: Secondary | ICD-10-CM | POA: Diagnosis not present

## 2023-04-13 MED ORDER — IOHEXOL 300 MG/ML  SOLN
80.0000 mL | Freq: Once | INTRAMUSCULAR | Status: AC | PRN
  Administered 2023-04-13: 80 mL via INTRAVENOUS

## 2023-04-13 MED ORDER — SODIUM CHLORIDE (PF) 0.9 % IJ SOLN
INTRAMUSCULAR | Status: AC
  Filled 2023-04-13: qty 50

## 2023-04-20 ENCOUNTER — Encounter (HOSPITAL_COMMUNITY): Payer: Federal, State, Local not specified - PPO

## 2023-05-10 DIAGNOSIS — I2510 Atherosclerotic heart disease of native coronary artery without angina pectoris: Secondary | ICD-10-CM | POA: Diagnosis not present

## 2023-05-11 ENCOUNTER — Inpatient Hospital Stay
Payer: Federal, State, Local not specified - PPO | Attending: Hematology and Oncology | Admitting: Hematology and Oncology

## 2023-05-11 ENCOUNTER — Other Ambulatory Visit: Payer: Self-pay

## 2023-05-11 VITALS — BP 156/62 | HR 75 | Temp 97.7°F | Resp 18 | Ht 64.0 in | Wt 199.2 lb

## 2023-05-11 DIAGNOSIS — N9489 Other specified conditions associated with female genital organs and menstrual cycle: Secondary | ICD-10-CM | POA: Diagnosis not present

## 2023-05-11 DIAGNOSIS — Z171 Estrogen receptor negative status [ER-]: Secondary | ICD-10-CM | POA: Insufficient documentation

## 2023-05-11 DIAGNOSIS — Z1722 Progesterone receptor negative status: Secondary | ICD-10-CM | POA: Insufficient documentation

## 2023-05-11 DIAGNOSIS — N200 Calculus of kidney: Secondary | ICD-10-CM | POA: Insufficient documentation

## 2023-05-11 DIAGNOSIS — Z9011 Acquired absence of right breast and nipple: Secondary | ICD-10-CM | POA: Insufficient documentation

## 2023-05-11 DIAGNOSIS — C50211 Malignant neoplasm of upper-inner quadrant of right female breast: Secondary | ICD-10-CM | POA: Insufficient documentation

## 2023-05-11 DIAGNOSIS — Z79899 Other long term (current) drug therapy: Secondary | ICD-10-CM | POA: Diagnosis not present

## 2023-05-11 DIAGNOSIS — G8929 Other chronic pain: Secondary | ICD-10-CM | POA: Insufficient documentation

## 2023-05-11 DIAGNOSIS — E041 Nontoxic single thyroid nodule: Secondary | ICD-10-CM | POA: Diagnosis not present

## 2023-05-11 NOTE — Assessment & Plan Note (Addendum)
Previously followed with [facility] oncology with Dr. Mayra Neer. 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  Bilateral renal calculi, submucosal fibrofatty infiltration gastric antrum could be from chronic gastritis pelvic congestion syndrome, nodular thyroid enlargement  Plan: GYN follow-up regarding the pelvic congestion syndrome Thyroid ultrasound and biopsy if needed GI follow-up regarding the gastric antrum changes

## 2023-05-12 DIAGNOSIS — I2510 Atherosclerotic heart disease of native coronary artery without angina pectoris: Secondary | ICD-10-CM | POA: Diagnosis not present

## 2023-05-12 DIAGNOSIS — E1169 Type 2 diabetes mellitus with other specified complication: Secondary | ICD-10-CM | POA: Diagnosis not present

## 2023-05-12 DIAGNOSIS — R0609 Other forms of dyspnea: Secondary | ICD-10-CM | POA: Diagnosis not present

## 2023-05-12 NOTE — Progress Notes (Signed)
Patient Care Team: Rema Fendt, NP as PCP - General (Nurse Practitioner) Magrinat, Valentino Hue, MD (Inactive) as Consulting Physician (Oncology) Griselda Miner, MD as Consulting Physician (General Surgery) Antony Blackbird, MD as Consulting Physician (Radiation Oncology) Salomon Fick, NP as Nurse Practitioner (Hematology and Oncology) Ihor Gully, MD (Inactive) as Consulting Physician (Urology)  DIAGNOSIS:  Encounter Diagnoses  Name Primary?   Malignant neoplasm of upper-inner quadrant of right breast in female, estrogen receptor negative (HCC) Yes   Thyroid nodule       CHIEF COMPLIANT: Follow-up to discuss results of CT scan    History of Present Illness   The patient with H/O breast cancer presents with chronic abdominal pain. She recently underwent a CT scan to investigate the cause of her discomfort. The scan revealed no evidence of cancer, but did identify several other potential issues. The patient has a history of ulcers, and the scan suggested possible gastritis in the stomach. Additionally, the scan showed a thyroid nodule and signs of pelvic congestion syndrome, a gynecological issue that may be associated with chronic abdominal pain. The patient also has small stones in both kidneys.    ALLERGIES:  is allergic to iohexol and other.  MEDICATIONS:  Current Outpatient Medications  Medication Sig Dispense Refill   aspirin EC 81 MG tablet Take 81 mg by mouth daily.     Blood Glucose Monitoring Suppl (ONETOUCH VERIO) w/Device KIT 1 kit by Does not apply route every morning. 1 kit 1   bumetanide (BUMEX) 1 MG tablet Take 1 mg by mouth daily.      carvedilol (COREG) 6.25 MG tablet Take 6.25 mg by mouth 2 (two) times daily with a meal.      cetirizine (ZYRTEC ALLERGY) 10 MG tablet Take 1 tablet (10 mg total) by mouth daily. (Patient not taking: Reported on 02/22/2023) 30 tablet 0   Cholecalciferol (VITAMIN D3) 5000 units TABS Take 5,000 Units by mouth daily.      Coenzyme Q10 (COQ10) 100 MG CAPS Take 1 capsule by mouth daily.     empagliflozin (JARDIANCE) 10 MG TABS tablet Take 1 tablet (10 mg total) by mouth daily before breakfast. 30 tablet 1   Garlic 1000 MG CAPS Take 1 capsule by mouth daily.     glucose blood (ONETOUCH VERIO) test strip Check blood sugar 3 x daily 200 each 3   ipratropium (ATROVENT) 0.03 % nasal spray Place 2 sprays into both nostrils 2 (two) times daily. 30 mL 0   Lancet Devices (ONE TOUCH DELICA LANCING DEV) MISC 1 application by Does not apply route every morning. 100 each 1   lisinopril (ZESTRIL) 40 MG tablet Take 1 tablet (40 mg total) by mouth daily. 30 tablet 0   Multiple Vitamin (MULTIVITAMIN WITH MINERALS) TABS tablet Take 1 tablet by mouth daily.     ONETOUCH DELICA LANCETS FINE MISC 1 application by Does not apply route 2 (two) times daily. 100 each 3   predniSONE (DELTASONE) 50 MG tablet Take 1 tablet (50 mg total) by mouth as directed. Take 50 mg 13 hours before scan, repeat at 6 hours before scan and repeat at 1 hours before scan With 1 hour dose pt to take benadryl 50 mg po 3 tablet 1   promethazine-dextromethorphan (PROMETHAZINE-DM) 6.25-15 MG/5ML syrup Take 5 mLs by mouth 3 (three) times daily as needed for cough. 200 mL 0   rosuvastatin (CRESTOR) 10 MG tablet Take 1 tablet (10 mg total) by mouth daily. 90 tablet 3  Semaglutide-Weight Management 0.25 MG/0.5ML SOAJ Inject 0.25 mg into the skin once a week. 2 mL 0   spironolactone (ALDACTONE) 25 MG tablet Take 25 mg by mouth daily.      Current Facility-Administered Medications  Medication Dose Route Frequency Provider Last Rate Last Admin   0.9 %  sodium chloride infusion  500 mL Intravenous Continuous Meryl Dare, MD        PHYSICAL EXAMINATION: ECOG PERFORMANCE STATUS: 1 - Symptomatic but completely ambulatory  Vitals:   05/11/23 1503  BP: (!) 156/62  Pulse: 75  Resp: 18  Temp: 97.7 F (36.5 C)  SpO2: 100%   Filed Weights   05/11/23 1503  Weight:  199 lb 3.2 oz (90.4 kg)     LABORATORY DATA:  I have reviewed the data as listed    Latest Ref Rng & Units 03/23/2023    2:14 PM 02/10/2022   10:53 AM 11/03/2021    4:47 PM  CMP  Glucose 70 - 99 mg/dL 914   782   BUN 8 - 23 mg/dL 28   15   Creatinine 9.56 - 1.00 mg/dL 2.13   0.86   Sodium 578 - 145 mmol/L 136   142   Potassium 3.5 - 5.1 mmol/L 3.9   4.1   Chloride 98 - 111 mmol/L 104   102   CO2 22 - 32 mmol/L 25   25   Calcium 8.9 - 10.3 mg/dL 9.7   46.9   Total Protein 6.5 - 8.1 g/dL 8.0  6.9    Total Bilirubin 0.3 - 1.2 mg/dL 0.4  0.4    Alkaline Phos 38 - 126 U/L 85  110    AST 15 - 41 U/L 17  11    ALT 0 - 44 U/L 16  9      Lab Results  Component Value Date   WBC 4.8 03/23/2023   HGB 10.9 (L) 03/23/2023   HCT 34.1 (L) 03/23/2023   MCV 81.8 03/23/2023   PLT 293 03/23/2023   NEUTROABS 2.4 03/23/2023    ASSESSMENT & PLAN:  Malignant neoplasm of upper-inner quadrant of right female breast [facility]) Previously followed with [facility] oncology with Dr. Mayra Neer. 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  Bilateral renal calculi, submucosal fibrofatty infiltration gastric antrum could be from chronic gastritis pelvic congestion syndrome, nodular thyroid enlargement  Plan: GYN follow-up regarding the pelvic congestion syndrome Thyroid ultrasound and biopsy if needed GI follow-up regarding the gastric antrum changes        Orders Placed This Encounter  Procedures   US SOFT TISSUE HEAD & NECK (NON-THYROID)    Standing Status:   Future    Standing Expiration Date:   05/10/2024    Order Specific Question:   Reason for exam:    Answer:   thyroid nodule    Order Specific Question:   Preferred imaging location?    Answer:   [facility]     Order Specific Question:   Release to patient    Answer:   Immediate   The patient has a good understanding of the overall plan. she agrees with it. she will call with any problems that may develop before the next visit here. Total time spent: 30 mins including face to face time and time spent for  planning, charting and co-ordination of care   Tamsen Meek, MD 05/12/23

## 2023-05-17 ENCOUNTER — Encounter: Payer: Federal, State, Local not specified - PPO | Admitting: Family

## 2023-05-17 NOTE — Progress Notes (Signed)
Erroneous encounter-disregard

## 2023-05-30 ENCOUNTER — Ambulatory Visit
Admission: EM | Admit: 2023-05-30 | Discharge: 2023-05-30 | Disposition: A | Discharge status: Home / Self Care | Payer: Federal, State, Local not specified - PPO | Attending: Internal Medicine | Admitting: Internal Medicine

## 2023-05-30 DIAGNOSIS — B9789 Other viral agents as the cause of diseases classified elsewhere: Secondary | ICD-10-CM | POA: Diagnosis not present

## 2023-05-30 DIAGNOSIS — J988 Other specified respiratory disorders: Secondary | ICD-10-CM | POA: Diagnosis not present

## 2023-05-30 MED ORDER — CETIRIZINE HCL 10 MG PO TABS: 10.0000 mg | ORAL_TABLET | Freq: Every day | ORAL | 0 refills | Status: AC

## 2023-05-30 MED ORDER — PROMETHAZINE-DM 6.25-15 MG/5ML PO SYRP: 5.0000 mL | ORAL_SOLUTION | Freq: Three times a day (TID) | ORAL | 0 refills | Status: AC | PRN

## 2023-05-30 MED ORDER — IPRATROPIUM BROMIDE 0.03 % NA SOLN: 2.0000 | Freq: Two times a day (BID) | NASAL | 0 refills | Status: DC

## 2023-05-30 MED ORDER — BENZONATATE 100 MG PO CAPS: 100.0000 mg | ORAL_CAPSULE | Freq: Three times a day (TID) | ORAL | 0 refills | Status: DC | PRN

## 2023-05-30 NOTE — Discharge Instructions (Signed)
We will notify you of your test results as they arrive and may take between about 24 hours.  I encourage you to sign up for MyChart if you have not already done so as this can be the easiest way for [place] to communicate results to you online or through a phone app.  Generally, we only contact you if it is a positive test result.  In the meantime, if you develop worsening symptoms including fever, chest pain, shortness of breath despite our current treatment plan then please report to the emergency room as this may be a sign of worsening status from possible viral infection.  Otherwise, we will manage this as a viral syndrome. For sore throat or cough try using a honey-based tea. Use 3 teaspoons of honey with juice squeezed from half lemon. Place shaved pieces of ginger into 1/2-1 cup of water and warm over stove top. Then mix the ingredients and repeat every 4 hours as needed. Please take Tylenol 500mg -650mg  every 6 hours for aches and pains, fevers. Hydrate very well with at least 2 liters of water. Eat light meals such as soups to replenish electrolytes and soft fruits, veggies. Start an antihistamine like Zyrtec (10mg  daily) for postnasal drainage, sinus congestion.  You can take this together with Atrovent nasal spray 2 times a day as needed for the same kind of congestion.  Use the cough medications as needed.

## 2023-05-30 NOTE — ED Triage Notes (Addendum)
Pt c/o dry cough, sneezing, scratchy throat, nasal congestion and HA x 2 days-NAD-steady gait

## 2023-05-30 NOTE — ED Provider Notes (Signed)
[facility]  Note:  This document was prepared using Conservation officer, historic buildings and may include unintentional dictation errors.  MRN: [id number] DOB: 1960-07-26  Subjective:   Kaitlyn Cervantes is a 62 y.o. female presenting for 2 day history of acute onset sinus congestion, scratchy throat, sneezing, dry cough. No chest pain, shortness of breath or wheezing.  No body pains, fevers.  No history of asthma.  Patient is not a smoker.  Would like a COVID test.  Patient has had breast cancer, is currently in remission.   Current Facility-Administered Medications:    0.9 %  sodium chloride infusion, 500 mL, Intravenous, Continuous, Meryl Dare, MD  Current Outpatient Medications:    aspirin EC 81 MG tablet, Take 81 mg by mouth daily., Disp: , Rfl:    Blood Glucose Monitoring Suppl (ONETOUCH VERIO) w/Device KIT, 1 kit by Does not apply route every morning., Disp: 1 kit, Rfl: 1   bumetanide (BUMEX) 1 MG tablet, Take 1 mg by mouth daily. , Disp: , Rfl:    carvedilol (COREG) 6.25 MG tablet, Take 6.25 mg by mouth 2 (two) times daily with a meal. , Disp: , Rfl:    cetirizine (ZYRTEC ALLERGY) 10 MG tablet, Take 1 tablet (10 mg total) by mouth daily. (Patient not taking: Reported on 02/22/2023), Disp: 30 tablet, Rfl: 0   Cholecalciferol (VITAMIN D3) 5000 units TABS, Take 5,000 Units by mouth daily., Disp: , Rfl:    Coenzyme Q10 (COQ10) 100 MG CAPS, Take 1 capsule by mouth daily., Disp: , Rfl:    empagliflozin (JARDIANCE) 10 MG TABS tablet, Take 1 tablet (10 mg total) by mouth daily before breakfast., Disp: 30 tablet, Rfl: 1   Garlic 1000 MG CAPS, Take 1 capsule by mouth daily., Disp: , Rfl:    glucose blood (ONETOUCH VERIO) test strip, Check blood sugar 3 x daily, Disp: 200 each, Rfl: 3   ipratropium (ATROVENT) 0.03 % nasal spray, Place 2 sprays into both nostrils 2 (two) times daily., Disp: 30 mL, Rfl: 0   Lancet Devices (ONE TOUCH DELICA LANCING DEV) MISC, 1  application by Does not apply route every morning., Disp: 100 each, Rfl: 1   lisinopril (ZESTRIL) 40 MG tablet, Take 1 tablet (40 mg total) by mouth daily., Disp: 30 tablet, Rfl: 0   Multiple Vitamin (MULTIVITAMIN WITH MINERALS) TABS tablet, Take 1 tablet by mouth daily., Disp: , Rfl:    ONETOUCH DELICA LANCETS FINE MISC, 1 application by Does not apply route 2 (two) times daily., Disp: 100 each, Rfl: 3   predniSONE (DELTASONE) 50 MG tablet, Take 1 tablet (50 mg total) by mouth as directed. Take 50 mg 13 hours before scan, repeat at 6 hours before scan and repeat at 1 hours before scan With 1 hour dose pt to take benadryl 50 mg po, Disp: 3 tablet, Rfl: 1   promethazine-dextromethorphan (PROMETHAZINE-DM) 6.25-15 MG/5ML syrup, Take 5 mLs by mouth 3 (three) times daily as needed for cough., Disp: 200 mL, Rfl: 0   rosuvastatin (CRESTOR) 10 MG tablet, Take 1 tablet (10 mg total) by mouth daily., Disp: 90 tablet, Rfl: 3   Semaglutide-Weight Management 0.25 MG/0.5ML SOAJ, Inject 0.25 mg into the skin once a week., Disp: 2 mL, Rfl: 0   spironolactone (ALDACTONE) 25 MG tablet, Take 25 mg by mouth daily. , Disp: , Rfl:    Allergies  Allergen Reactions   Iohexol Hives    iodine   Other Itching and Swelling  Nuts excludes peanuts    Past Medical History:  Diagnosis Date   Anemia 2009   Blood transfusion without reported diagnosis 2009   Breast cancer of upper-inner quadrant of right female breast (HCC) 10/19/2015   BRCA I & II negative   Diabetes mellitus without complication (HCC)    Gallstones    has not had removed   History of chicken pox    History of stomach ulcers    treated    Hypertension    Kidney stones    Sleep apnea 2005   UTI (urinary tract infection)      Past Surgical History:  Procedure Laterality Date   BREAST BIOPSY     CESAREAN SECTION     x3   kidney stones  2007   right mastectomy     07/2016   SPLENECTOMY     age 14     Family History  Problem Relation Age  of Onset   Breast cancer Mother        x2 14's & 50's   Cancer Mother    Diabetes Mother    Hypertension Mother    Heart disease Father    Early death Father    Cancer Sister    Cancer Maternal Aunt    Breast cancer Maternal Aunt        mid 24's   Diabetes Brother    Depression Brother    Drug abuse Brother    Mental illness Brother    Colon polyps Neg Hx    Colon cancer Neg Hx    Esophageal cancer Neg Hx    Rectal cancer Neg Hx    Stomach cancer Neg Hx     Social History   Tobacco Use   Smoking status: Never    Passive exposure: Never   Smokeless tobacco: Never  Vaping Use   Vaping status: Never Used  Substance Use Topics   Alcohol use: No    Comment: very rare   Drug use: No    ROS   Objective:   Vitals: BP (!) 184/82 (BP Location: Left Arm)   Pulse 83   Temp 99.2 F (37.3 C) (Oral)   Resp 20   SpO2 98%   BP Readings from Last 3 Encounters:  05/30/23 (!) 184/82  05/11/23 (!) 156/62  03/23/23 138/62   Physical Exam Constitutional:      General: She is not in acute distress.    Appearance: Normal appearance. She is well-developed and normal weight. She is not ill-appearing, toxic-appearing or diaphoretic.  HENT:     Head: Normocephalic and atraumatic.     Right Ear: Tympanic membrane, ear canal and external ear normal. No drainage or tenderness. No middle ear effusion. There is no impacted cerumen. Tympanic membrane is not erythematous or bulging.     Left Ear: Tympanic membrane, ear canal and external ear normal. No drainage or tenderness.  No middle ear effusion. There is no impacted cerumen. Tympanic membrane is not erythematous or bulging.     Nose: Nose normal. No congestion or rhinorrhea.     Mouth/Throat:     Mouth: Mucous membranes are moist. No oral lesions.     Pharynx: No pharyngeal swelling, oropharyngeal exudate, posterior oropharyngeal erythema or uvula swelling.     Tonsils: No tonsillar exudate or tonsillar abscesses.  Eyes:      General: No scleral icterus.       Right eye: No discharge.        Left eye:  No discharge.     Extraocular Movements: Extraocular movements intact.     Right eye: Normal extraocular motion.     Left eye: Normal extraocular motion.     Conjunctiva/sclera: Conjunctivae normal.  Cardiovascular:     Rate and Rhythm: Normal rate and regular rhythm.     Heart sounds: Normal heart sounds. No murmur heard.    No friction rub. No gallop.  Pulmonary:     Effort: Pulmonary effort is normal. No respiratory distress.     Breath sounds: No stridor. No wheezing, rhonchi or rales.  Chest:     Chest wall: No tenderness.  Musculoskeletal:     Cervical back: Normal range of motion and neck supple.  Lymphadenopathy:     Cervical: No cervical adenopathy.  Skin:    General: Skin is warm and dry.  Neurological:     General: No focal deficit present.     Mental Status: She is alert and oriented to person, place, and time.  Psychiatric:        Mood and Affect: Mood normal.        Behavior: Behavior normal.     Assessment and Plan :   PDMP not reviewed this encounter.  1. Viral respiratory infection    Deferred imaging given clear cardiopulmonary exam, hemodynamically stable vital signs.  Suspect viral URI, viral syndrome. Physical exam findings reassuring and vital signs stable for discharge. Advised supportive care, offered symptomatic relief. Counseled patient on potential for adverse effects with medications prescribed/recommended today, ER and return-to-clinic precautions discussed, patient verbalized understanding.     Wallis Bamberg, [place] 05/30/23 6440

## 2023-05-31 LAB — SARS CORONAVIRUS 2 (TAT 6-24 HRS): SARS Coronavirus 2: NEGATIVE

## 2023-06-01 ENCOUNTER — Telehealth: Payer: Self-pay

## 2023-06-01 NOTE — Telephone Encounter (Signed)
Pt called regarding COVID test results. Let her know it was negative, she expressed understanding.

## 2023-06-13 ENCOUNTER — Ambulatory Visit: Payer: Federal, State, Local not specified - PPO | Admitting: Family

## 2023-06-23 ENCOUNTER — Ambulatory Visit: Payer: Federal, State, Local not specified - PPO | Admitting: Family

## 2023-06-23 VITALS — BP 129/77 | HR 60 | Temp 98.2°F | Ht 64.0 in | Wt 194.0 lb

## 2023-06-23 DIAGNOSIS — Z7985 Long-term (current) use of injectable non-insulin antidiabetic drugs: Secondary | ICD-10-CM | POA: Diagnosis not present

## 2023-06-23 DIAGNOSIS — Z1329 Encounter for screening for other suspected endocrine disorder: Secondary | ICD-10-CM | POA: Diagnosis not present

## 2023-06-23 DIAGNOSIS — E119 Type 2 diabetes mellitus without complications: Secondary | ICD-10-CM | POA: Diagnosis not present

## 2023-06-23 DIAGNOSIS — Z7984 Long term (current) use of oral hypoglycemic drugs: Secondary | ICD-10-CM | POA: Diagnosis not present

## 2023-06-23 MED ORDER — TIRZEPATIDE 2.5 MG/0.5ML ~~LOC~~ SOAJ
2.5000 mg | SUBCUTANEOUS | 1 refills | Status: DC
Start: 2023-06-23 — End: 2023-09-04

## 2023-06-23 NOTE — Progress Notes (Unsigned)
Patient ID: Kaitlyn Cervantes, female    DOB: 14-Dec-1960  MRN: [id number]  CC: Medical Management of Chronic Issues   Subjective: Kaitlyn Cervantes is a 62 y.o. 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     benzonatate (TESSALON) 100 MG capsule Take 1 capsule (100 mg total) by mouth 3 (three) times daily as needed for cough. 30 capsule 0   Blood Glucose Monitoring Suppl (ONETOUCH VERIO) w/Device KIT 1 kit by Does not apply route every morning. 1 kit 1   bumetanide (BUMEX) 1 MG tablet Take 1 mg by mouth daily.      carvedilol (COREG) 6.25 MG tablet Take 6.25 mg by mouth 2 (two) times daily with a meal.      cetirizine (ZYRTEC ALLERGY) 10 MG tablet Take 1 tablet (10 mg total) by mouth daily. 30 tablet 0   Cholecalciferol (VITAMIN D3) 5000 units TABS Take 5,000 Units by mouth daily.     Coenzyme Q10 (COQ10) 100 MG CAPS Take 1 capsule by mouth daily.     empagliflozin (JARDIANCE) 10 MG TABS tablet Take 1 tablet (10 mg total) by mouth daily before breakfast. 30 tablet 1   Garlic 1000 MG CAPS Take 1 capsule by mouth daily.     glucose blood (ONETOUCH VERIO) test  strip Check blood sugar 3 x daily 200 each 3   ipratropium (ATROVENT) 0.03 % nasal spray Place 2 sprays into both nostrils 2 (two) times daily. 30 mL 0   Lancet Devices (ONE TOUCH DELICA LANCING DEV) MISC 1 application by Does not apply route every morning. 100 each 1   lisinopril (ZESTRIL) 40 MG tablet Take 1 tablet (40 mg total) by mouth daily. 30 tablet 0   Multiple Vitamin (MULTIVITAMIN WITH MINERALS) TABS tablet Take 1 tablet by mouth daily.     ONETOUCH DELICA LANCETS FINE MISC 1 application by Does not apply route 2 (two) times daily. 100 each 3   predniSONE (DELTASONE) 50 MG tablet Take 1 tablet (50 mg total) by mouth as directed. Take 50 mg 13 hours before scan, repeat at 6 hours before scan and repeat at 1 hours before scan With 1 hour dose pt to take benadryl 50 mg po 3 tablet 1   promethazine-dextromethorphan (PROMETHAZINE-DM) 6.25-15 MG/5ML syrup Take 5 mLs by mouth 3 (three) times daily as needed for cough. 200 mL 0   rosuvastatin (CRESTOR) 10 MG tablet Take 1 tablet (10 mg total) by mouth daily. 90 tablet 3   Semaglutide-Weight Management 0.25 MG/0.5ML SOAJ Inject 0.25 mg into the skin once a week. 2 mL 0   spironolactone (ALDACTONE) 25 MG tablet Take 25 mg by mouth daily.  Current Facility-Administered Medications on File Prior to Visit  Medication Dose Route Frequency Provider Last Rate Last Admin   0.9 %  sodium chloride infusion  500 mL Intravenous Continuous Meryl Dare, MD        Allergies  Allergen Reactions   Iohexol Hives    iodine   Other Itching and Swelling    Nuts excludes peanuts    Social History   Socioeconomic History   Marital status: Widowed    Spouse name: Not on file   Number of children: Not on file   Years of education: Not on file   Highest education level: Not on file  Occupational History   Not on file  Tobacco Use   Smoking status: Never    Passive exposure: Never   Smokeless tobacco: Never  Vaping Use   Vaping status: Never  Used  Substance and Sexual Activity   Alcohol use: No    Comment: very rare   Drug use: No   Sexual activity: Not Currently    Birth control/protection: Post-menopausal    Comment: 1st intercourse- 76, partners- 1, widow  Other Topics Concern   Not on file  Social History Narrative   Not on file   Social Drivers of Health   Financial Resource Strain: Not on file  Food Insecurity: Not on file  Transportation Needs: Not on file  Physical Activity: Not on file  Stress: Not on file  Social Connections: Not on file  Intimate Partner Violence: Not on file    Family History  Problem Relation Age of Onset   Breast cancer Mother        x2 34's & 50's   Cancer Mother    Diabetes Mother    Hypertension Mother    Heart disease Father    Early death Father    Cancer Sister    Cancer Maternal Aunt    Breast cancer Maternal Aunt        mid 40's   Diabetes Brother    Depression Brother    Drug abuse Brother    Mental illness Brother    Colon polyps Neg Hx    Colon cancer Neg Hx    Esophageal cancer Neg Hx    Rectal cancer Neg Hx    Stomach cancer Neg Hx     Past Surgical History:  Procedure Laterality Date   BREAST BIOPSY     CESAREAN SECTION     x3   kidney stones  2007   right mastectomy     07/2016   SPLENECTOMY     age 57     ROS: Review of Systems Negative except as stated above  PHYSICAL EXAM: There were no vitals taken for this visit.  Physical Exam  {female adult master:310786} {female adult master:310785}     Latest Ref Rng & Units 03/23/2023    2:14 PM 02/10/2022   10:53 AM 11/03/2021    4:47 PM  CMP  Glucose 70 - 99 mg/dL 010   272   BUN 8 - 23 mg/dL 28   15   Creatinine 5.36 - 1.00 mg/dL 6.44   0.34   Sodium 742 - 145 mmol/L 136   142   Potassium 3.5 - 5.1 mmol/L 3.9   4.1   Chloride 98 - 111 mmol/L 104   102   CO2 22 - 32 mmol/L 25   25   Calcium 8.9 - 10.3 mg/dL 9.7   59.5   Total Protein 6.5 -  8.1 g/dL 8.0  6.9    Total Bilirubin 0.3 - 1.2  mg/dL 0.4  0.4    Alkaline Phos 38 - 126 U/L 85  110    AST 15 - 41 U/L 17  11    ALT 0 - 44 U/L 16  9     Lipid Panel     Component Value Date/Time   CHOL 201 (H) 07/03/2019 1117   CHOL 194 05/30/2017 0921   TRIG 80.0 07/03/2019 1117   HDL 47.20 07/03/2019 1117   HDL 52 05/30/2017 0921   CHOLHDL 4 07/03/2019 1117   VLDL 16.0 07/03/2019 1117   LDLCALC 138 (H) 07/03/2019 1117   LDLCALC 128 (H) 05/30/2017 0921   LDLDIRECT 147 (H) 09/19/2014 1058    CBC    Component Value Date/Time   WBC 4.8 03/23/2023 1414   WBC 4.1 07/03/2019 1117   RBC 4.17 03/23/2023 1414   HGB 10.9 (L) 03/23/2023 1414   HGB 10.8 (L) 02/10/2022 1053   HGB 12.7 10/21/2015 1240   HCT 34.1 (L) 03/23/2023 1414   HCT 33.8 (L) 02/10/2022 1053   HCT 40.1 10/21/2015 1240   PLT 293 03/23/2023 1414   PLT 302 02/10/2022 1053   MCV 81.8 03/23/2023 1414   MCV 81 02/10/2022 1053   MCV 78.8 (L) 10/21/2015 1240   MCH 26.1 03/23/2023 1414   MCHC 32.0 03/23/2023 1414   RDW 14.5 03/23/2023 1414   RDW 13.3 02/10/2022 1053   RDW 15.4 (H) 10/21/2015 1240   LYMPHSABS 1.7 03/23/2023 1414   LYMPHSABS 1.2 09/22/2017 0922   LYMPHSABS 2.2 10/21/2015 1240   MONOABS 0.5 03/23/2023 1414   MONOABS 0.5 10/21/2015 1240   EOSABS 0.1 03/23/2023 1414   EOSABS 0.1 09/22/2017 0922   BASOSABS 0.0 03/23/2023 1414   BASOSABS 0.0 09/22/2017 0922   BASOSABS 0.0 10/21/2015 1240    ASSESSMENT AND PLAN:  There are no diagnoses linked to this encounter.   Patient was given the opportunity to ask questions.  Patient verbalized understanding of the plan and was able to repeat key elements of the plan. Patient was given clear instructions to go to Emergency Department or return to medical center if symptoms don't improve, worsen, or new problems develop.The patient verbalized understanding.   No orders of the defined types were placed in this encounter.    Requested Prescriptions    No prescriptions requested or ordered in this  encounter    No follow-ups on file.  Rema Fendt, NP

## 2023-06-23 NOTE — Progress Notes (Unsigned)
Diabetes and weight check visit.  Patient states she wants to know about the scan she had done.    Patient wants to go over some of the referrals you sent out for her.

## 2023-06-24 LAB — MICROALBUMIN / CREATININE URINE RATIO
Creatinine, Urine: 75.4 mg/dL
Microalb/Creat Ratio: 152 mg/g{creat} — ABNORMAL HIGH (ref 0–29)
Microalbumin, Urine: 114.8 ug/mL

## 2023-06-28 ENCOUNTER — Telehealth: Payer: Self-pay

## 2023-06-28 NOTE — Telephone Encounter (Signed)
Good afternoon Tresa Endo, can you help me out with this prior auth?  Mounjaro 2.5MG /0.5ML Auto-injectors  I looked at previous telephone encounters and I couldn't find one for this medication. Thank you.

## 2023-06-28 NOTE — Telephone Encounter (Signed)
Error

## 2023-06-29 ENCOUNTER — Other Ambulatory Visit: Payer: Self-pay

## 2023-06-30 ENCOUNTER — Other Ambulatory Visit: Payer: Self-pay

## 2023-07-03 ENCOUNTER — Ambulatory Visit (HOSPITAL_COMMUNITY)
Admission: RE | Admit: 2023-07-03 | Discharge: 2023-07-03 | Disposition: A | Discharge status: Home / Self Care | Payer: Federal, State, Local not specified - PPO | Source: Ambulatory Visit | Attending: Hematology and Oncology | Admitting: Hematology and Oncology

## 2023-07-03 ENCOUNTER — Other Ambulatory Visit: Payer: Self-pay | Admitting: Hematology and Oncology

## 2023-07-03 DIAGNOSIS — E041 Nontoxic single thyroid nodule: Secondary | ICD-10-CM

## 2023-07-03 DIAGNOSIS — E042 Nontoxic multinodular goiter: Secondary | ICD-10-CM | POA: Diagnosis not present

## 2023-07-03 DIAGNOSIS — Z171 Estrogen receptor negative status [ER-]: Secondary | ICD-10-CM

## 2023-07-07 DIAGNOSIS — R0609 Other forms of dyspnea: Secondary | ICD-10-CM | POA: Diagnosis not present

## 2023-07-07 DIAGNOSIS — I517 Cardiomegaly: Secondary | ICD-10-CM | POA: Diagnosis not present

## 2023-07-26 ENCOUNTER — Ambulatory Visit: Payer: BC Managed Care – PPO | Admitting: Family

## 2023-07-27 ENCOUNTER — Telehealth: Payer: Self-pay | Admitting: Family

## 2023-07-27 NOTE — Telephone Encounter (Signed)
Sent to [facility]. Please keep all scheduled appointments.

## 2023-07-27 NOTE — Telephone Encounter (Signed)
 Marland Kitchen

## 2023-08-10 DIAGNOSIS — Z1329 Encounter for screening for other suspected endocrine disorder: Secondary | ICD-10-CM | POA: Diagnosis not present

## 2023-08-10 DIAGNOSIS — E119 Type 2 diabetes mellitus without complications: Secondary | ICD-10-CM | POA: Diagnosis not present

## 2023-08-11 ENCOUNTER — Other Ambulatory Visit: Payer: Self-pay | Admitting: Family

## 2023-08-11 ENCOUNTER — Encounter: Payer: Self-pay | Admitting: Family

## 2023-08-11 DIAGNOSIS — N1832 Chronic kidney disease, stage 3b: Secondary | ICD-10-CM

## 2023-08-11 LAB — BASIC METABOLIC PANEL
BUN/Creatinine Ratio: 14 (ref 12–28)
BUN: 24 mg/dL (ref 8–27)
CO2: 21 mmol/L (ref 20–29)
Calcium: 9.6 mg/dL (ref 8.7–10.3)
Chloride: 103 mmol/L (ref 96–106)
Creatinine, Ser: 1.69 mg/dL — ABNORMAL HIGH (ref 0.57–1.00)
Glucose: 121 mg/dL — ABNORMAL HIGH (ref 70–99)
Potassium: 4.6 mmol/L (ref 3.5–5.2)
Sodium: 139 mmol/L (ref 134–144)
eGFR: 34 mL/min/{1.73_m2} — ABNORMAL LOW (ref >59)

## 2023-08-11 LAB — TSH: TSH: 1.39 u[IU]/mL (ref 0.450–4.500)

## 2023-08-14 ENCOUNTER — Telehealth: Payer: Self-pay | Admitting: Family

## 2023-08-16 ENCOUNTER — Ambulatory Visit: Payer: BC Managed Care – PPO | Admitting: Family

## 2023-08-29 ENCOUNTER — Other Ambulatory Visit: Payer: Self-pay | Admitting: Family

## 2023-08-29 DIAGNOSIS — E119 Type 2 diabetes mellitus without complications: Secondary | ICD-10-CM

## 2023-09-04 ENCOUNTER — Ambulatory Visit (INDEPENDENT_AMBULATORY_CARE_PROVIDER_SITE_OTHER): Payer: Federal, State, Local not specified - PPO | Admitting: Family

## 2023-09-04 VITALS — BP 100/66 | HR 72 | Temp 98.6°F | Ht 64.0 in | Wt 188.0 lb

## 2023-09-04 DIAGNOSIS — E041 Nontoxic single thyroid nodule: Secondary | ICD-10-CM | POA: Diagnosis not present

## 2023-09-04 DIAGNOSIS — Z0100 Encounter for examination of eyes and vision without abnormal findings: Secondary | ICD-10-CM

## 2023-09-04 DIAGNOSIS — E119 Type 2 diabetes mellitus without complications: Secondary | ICD-10-CM | POA: Diagnosis not present

## 2023-09-04 DIAGNOSIS — N1832 Chronic kidney disease, stage 3b: Secondary | ICD-10-CM | POA: Diagnosis not present

## 2023-09-04 LAB — POCT GLYCOSYLATED HEMOGLOBIN (HGB A1C): Hemoglobin A1C: 6.8 % — AB (ref 4.0–5.6)

## 2023-09-04 MED ORDER — TIRZEPATIDE 2.5 MG/0.5ML ~~LOC~~ SOAJ: 2.5000 mg | SUBCUTANEOUS | 0 refills | Status: DC

## 2023-09-04 NOTE — Progress Notes (Signed)
 Patient states issues with her thyroid, wants to talk about mounjaro as well.

## 2023-09-04 NOTE — Progress Notes (Signed)
 Patient ID: Kaitlyn Cervantes, female    DOB: [birth year]  MRN: [id number]  CC: Chronic Conditions Follow-Up  Subjective: Kaitlyn Cervantes is a 63 y.o. female who presents for chronic conditions follow-up.   Her concerns today include:  - Doing well on Tirzepatide, no issues/concerns. Denies red flag symptoms associated with diabetes.  - Reports history of thyroid nodules seen on CT scan and ultrasound from Oncology. Requests referral to thyroid specialist.  - States unpleasant patient experience with Nephrology and would like new referral.   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     Blood Glucose Monitoring Suppl (ONETOUCH VERIO) w/Device KIT 1 kit by Does not apply route every morning. 1 kit 1   bumetanide (BUMEX) 1 MG tablet Take 1 mg by mouth daily.      carvedilol (COREG) 6.25 MG tablet Take 6.25 mg by mouth 2 (two) times daily with a meal.      Cholecalciferol (VITAMIN D3) 5000 units TABS Take 5,000 Units by mouth daily.     empagliflozin (JARDIANCE) 10 MG TABS tablet Take 1 tablet (10 mg total) by mouth daily before breakfast. 30 tablet 1   Garlic 1000 MG CAPS Take 1 capsule by mouth daily.     glucose  blood (ONETOUCH VERIO) test strip Check blood sugar 3 x daily 200 each 3   Lancet Devices (ONE TOUCH DELICA LANCING DEV) MISC 1 application by Does not apply route every morning. 100 each 1   lisinopril (ZESTRIL) 40 MG tablet Take 1 tablet (40 mg total) by mouth daily. 30 tablet 0   Multiple Vitamin (MULTIVITAMIN WITH MINERALS) TABS tablet Take 1 tablet by mouth daily.     ONETOUCH DELICA LANCETS FINE MISC 1 application by Does not apply route 2 (two) times daily. 100 each 3   spironolactone (ALDACTONE) 25 MG tablet Take 25 mg by mouth daily.      benzonatate (TESSALON) 100 MG capsule Take 1 capsule (100 mg total) by mouth 3 (three) times daily as needed for cough. (Patient not taking: Reported on 09/04/2023) 30 capsule 0   cetirizine (ZYRTEC ALLERGY) 10 MG tablet Take 1 tablet (10 mg total) by mouth daily. (Patient not taking: Reported on 09/04/2023) 30 tablet 0   Coenzyme Q10 (COQ10) 100 MG CAPS Take 1 capsule by mouth daily. (Patient not taking: Reported on 09/04/2023)     ipratropium (ATROVENT) 0.03 % nasal spray Place 2 sprays into both nostrils 2 (two) times daily. (Patient not taking: Reported on 09/04/2023) 30 mL 0   promethazine-dextromethorphan (PROMETHAZINE-DM) 6.25-15 MG/5ML syrup Take 5 mLs by mouth 3 (three) times daily as needed for cough. (Patient not taking: Reported on 09/04/2023) 200 mL 0   rosuvastatin (CRESTOR) 10 MG tablet  Take 1 tablet (10 mg total) by mouth daily. (Patient not taking: Reported on 09/04/2023) 90 tablet 3   Current Facility-Administered Medications on File Prior to Visit  Medication Dose Route Frequency Provider Last Rate Last Admin   0.9 %  sodium chloride infusion  500 mL Intravenous Continuous Meryl Dare, MD        Allergies  Allergen Reactions   Iohexol Hives    iodine   Other Itching and Swelling    Nuts excludes peanuts    Social History   Socioeconomic History   Marital status: Widowed    Spouse name: Not on file   Number of children: Not  on file   Years of education: Not on file   Highest education level: Not on file  Occupational History   Not on file  Tobacco Use   Smoking status: Never    Passive exposure: Never   Smokeless tobacco: Never  Vaping Use   Vaping status: Never Used  Substance and Sexual Activity   Alcohol use: No    Comment: very rare   Drug use: No   Sexual activity: Not Currently    Birth control/protection: Post-menopausal    Comment: 1st intercourse- 62, partners- 1, widow  Other Topics Concern   Not on file  Social History Narrative   Not on file   Social Drivers of Health   Financial Resource Strain: Not on file  Food Insecurity: Not on file  Transportation Needs: Not on file  Physical Activity: Not on file  Stress: Not on file  Social Connections: Not on file  Intimate Partner Violence: Not on file    Family History  Problem Relation Age of Onset   Breast cancer Mother        x2 25's & 13's   Cancer Mother    Diabetes Mother    Hypertension Mother    Heart disease Father    Early death Father    Cancer Sister    Cancer Maternal Aunt    Breast cancer Maternal Aunt        mid 24's   Diabetes Brother    Depression Brother    Drug abuse Brother    Mental illness Brother    Colon polyps Neg Hx    Colon cancer Neg Hx    Esophageal cancer Neg Hx    Rectal cancer Neg Hx    Stomach cancer Neg Hx     Past Surgical History:  Procedure Laterality Date   BREAST BIOPSY     CESAREAN SECTION     x3   kidney stones  2007   right mastectomy     07/2016   SPLENECTOMY     age 72     ROS: Review of Systems Negative except as stated above  PHYSICAL EXAM: BP 100/66   Pulse 72   Temp 98.6 F (37 C) (Oral)   Ht 5\' 4"  (1.626 m)   Wt 188 lb (85.3 kg)   SpO2 94%   BMI 32.27 kg/m   Physical Exam HENT:     Head: Normocephalic and atraumatic.     Nose: Nose normal.     Mouth/Throat:     Mouth: Mucous membranes are moist.     Pharynx: Oropharynx is clear.  Eyes:      Extraocular Movements: Extraocular movements intact.     Conjunctiva/sclera: Conjunctivae normal.     Pupils: Pupils are equal, round, and reactive to light.  Cardiovascular:     Rate  and Rhythm: Normal rate and regular rhythm.     Pulses: Normal pulses.     Heart sounds: Normal heart sounds.  Pulmonary:     Effort: Pulmonary effort is normal.     Breath sounds: Normal breath sounds.  Musculoskeletal:        General: Normal range of motion.     Cervical back: Normal range of motion and neck supple.  Neurological:     General: No focal deficit present.     Mental Status: She is alert and oriented to person, place, and time.  Psychiatric:        Mood and Affect: Mood normal.        Behavior: Behavior normal.     ASSESSMENT AND PLAN: 1. Type 2 diabetes mellitus without complication, without long-term current use of insulin (HCC) (Primary) - Continue Tirzepatide as prescribed. Counseled on medication adherence/adverse effects. - Hemoglobin A1c result pending.  - Discussed the importance of healthy eating habits, low-carbohydrate diet, low-sugar diet, regular aerobic exercise (at least 150 minutes a week as tolerated) and medication compliance to achieve or maintain control of diabetes. - Follow-up with primary provider as scheduled. - POCT glycosylated hemoglobin (Hb A1C); Future - tirzepatide [facility]) 2.5 MG/0.5ML Pen; Inject 2.5 mg into the skin once a week.  Dispense: 6 mL; Refill: 0 - POCT glycosylated hemoglobin (Hb A1C)  2. Diabetic eye exam [facility]) - Referral to Ophthalmology for evaluation/management. - Ambulatory referral to Ophthalmology  3. Encounter for diabetic foot exam [facility]) - Referral to Podiatry for evaluation/management. - Ambulatory referral to Podiatry  4. Stage 3b chronic kidney disease (HCC) - Referral to Nephrology for evaluation/management. - Ambulatory referral to Nephrology  5. Thyroid nodule - Referral to ENT for evaluation/management. - Ambulatory  referral to ENT    Patient was given the opportunity to ask questions.  Patient verbalized understanding of the plan and was able to repeat key elements of the plan. Patient was given clear instructions to go to Emergency Department or return to medical center if symptoms don't improve, worsen, or new problems develop.The patient verbalized understanding.   Orders Placed This Encounter  Procedures   Ambulatory referral to Nephrology   Ambulatory referral to ENT   Ambulatory referral to Podiatry   Ambulatory referral to Ophthalmology   POCT glycosylated hemoglobin (Hb A1C)     Requested Prescriptions   Signed Prescriptions Disp Refills   tirzepatide (MOUNJARO) 2.5 MG/0.5ML Pen 6 mL 0    Sig: Inject 2.5 mg into the skin once a week.    Follow-up with primary provider as scheduled.  Rema Fendt, NP

## 2023-09-05 ENCOUNTER — Encounter: Payer: Self-pay | Admitting: Family

## 2024-01-08 ENCOUNTER — Ambulatory Visit: Payer: Self-pay | Admitting: Podiatry

## 2024-01-08 ENCOUNTER — Other Ambulatory Visit: Payer: Self-pay | Admitting: Family

## 2024-01-08 DIAGNOSIS — E119 Type 2 diabetes mellitus without complications: Secondary | ICD-10-CM

## 2024-01-08 NOTE — Telephone Encounter (Signed)
 Complete

## 2024-01-18 ENCOUNTER — Ambulatory Visit

## 2024-01-18 ENCOUNTER — Ambulatory Visit (INDEPENDENT_AMBULATORY_CARE_PROVIDER_SITE_OTHER): Payer: Self-pay | Admitting: Podiatry

## 2024-01-18 VITALS — Ht 64.0 in | Wt 188.0 lb

## 2024-01-18 DIAGNOSIS — B351 Tinea unguium: Secondary | ICD-10-CM | POA: Diagnosis not present

## 2024-01-18 DIAGNOSIS — E119 Type 2 diabetes mellitus without complications: Secondary | ICD-10-CM

## 2024-01-18 DIAGNOSIS — M7752 Other enthesopathy of left foot: Secondary | ICD-10-CM

## 2024-01-18 DIAGNOSIS — B353 Tinea pedis: Secondary | ICD-10-CM

## 2024-01-18 MED ORDER — CICLOPIROX 8 % EX SOLN: Freq: Every day | CUTANEOUS | 2 refills | Status: AC

## 2024-01-18 MED ORDER — KETOCONAZOLE 2 % EX CREA: 1.0000 | TOPICAL_CREAM | Freq: Two times a day (BID) | CUTANEOUS | 2 refills | Status: DC

## 2024-01-18 NOTE — Patient Instructions (Signed)
 Look for urea 40% cream or ointment and apply to the thickened dry skin / calluses. This can be bought over the counter, at a pharmacy or online such as [employer].

## 2024-01-21 NOTE — Progress Notes (Signed)
  Subjective:  Patient ID: Kaitlyn Cervantes Ill, female    DOB: 10/03/1960,  MRN: [id number]  Chief Complaint  Patient presents with   Nail Problem    Rm 7 Patient is here for diabetic foot care.  Patient has no additional concerns.    63 y.o. female presents with the above complaint. History confirmed with patient.  Her diabetes is well-controlled notes some itching and dry skin  Objective:  Physical Exam: warm, good capillary refill, no trophic changes or ulcerative lesions, normal DP and PT pulses, normal sensory exam, onychomycosis, and tinea pedis.  Assessment:   1. Onychomycosis   2. Tinea pedis of both feet   3. Encounter for diabetic foot exam [facility])      Plan:  Patient was evaluated and treated and all questions answered.  Patient educated on diabetes. Discussed proper diabetic foot care and discussed risks and complications of disease. Educated patient in depth on reasons to return to the office immediately should he/she discover anything concerning or new on the feet. All questions answered. Discussed proper shoes as well.   Discussed the etiology and treatment options for tinea pedis.  Discussed topical and oral treatment.  Recommended topical treatment with 2% ketoconazole  cream.  This was sent to the patient's pharmacy.  Also discussed appropriate foot hygiene, use of antifungal spray such as Tinactin in shoes, as well as cleaning her foot surfaces such as showers and bathroom floors with bleach.  Also has onychomycosis.  She would prefer to avoid oral medications.  Does have some contraindication with statin use.  I prescribed her Penlac  and reviewed its use.   Return if symptoms worsen or fail to improve.

## 2024-01-22 DIAGNOSIS — Z1231 Encounter for screening mammogram for malignant neoplasm of breast: Secondary | ICD-10-CM | POA: Diagnosis not present

## 2024-03-13 ENCOUNTER — Encounter: Payer: Self-pay | Admitting: Family

## 2024-03-13 ENCOUNTER — Ambulatory Visit (INDEPENDENT_AMBULATORY_CARE_PROVIDER_SITE_OTHER): Payer: Federal, State, Local not specified - PPO | Admitting: Family

## 2024-03-13 VITALS — BP 154/76 | HR 62 | Temp 98.1°F | Resp 16 | Ht 64.0 in | Wt 184.0 lb

## 2024-03-13 DIAGNOSIS — E1165 Type 2 diabetes mellitus with hyperglycemia: Secondary | ICD-10-CM

## 2024-03-13 DIAGNOSIS — Z1329 Encounter for screening for other suspected endocrine disorder: Secondary | ICD-10-CM

## 2024-03-13 DIAGNOSIS — Z13228 Encounter for screening for other metabolic disorders: Secondary | ICD-10-CM

## 2024-03-13 DIAGNOSIS — Z79899 Other long term (current) drug therapy: Secondary | ICD-10-CM

## 2024-03-13 DIAGNOSIS — Z13220 Encounter for screening for lipoid disorders: Secondary | ICD-10-CM

## 2024-03-13 DIAGNOSIS — Z130 Encounter for screening for diseases of the blood and blood-forming organs and certain disorders involving the immune mechanism: Secondary | ICD-10-CM | POA: Diagnosis not present

## 2024-03-13 DIAGNOSIS — Z0000 Encounter for general adult medical examination without abnormal findings: Secondary | ICD-10-CM | POA: Diagnosis not present

## 2024-03-13 DIAGNOSIS — Z7984 Long term (current) use of oral hypoglycemic drugs: Secondary | ICD-10-CM

## 2024-03-13 DIAGNOSIS — E119 Type 2 diabetes mellitus without complications: Secondary | ICD-10-CM

## 2024-03-13 DIAGNOSIS — I10 Essential (primary) hypertension: Secondary | ICD-10-CM | POA: Diagnosis not present

## 2024-03-13 DIAGNOSIS — Z7985 Long-term (current) use of injectable non-insulin antidiabetic drugs: Secondary | ICD-10-CM

## 2024-03-13 DIAGNOSIS — Z124 Encounter for screening for malignant neoplasm of cervix: Secondary | ICD-10-CM

## 2024-03-13 DIAGNOSIS — Z5320 Procedure and treatment not carried out because of patient's decision for unspecified reasons: Secondary | ICD-10-CM

## 2024-03-13 MED ORDER — MOUNJARO 2.5 MG/0.5ML ~~LOC~~ SOAJ
2.5000 mg | SUBCUTANEOUS | 0 refills | Status: AC
Start: 2024-03-13 — End: ?

## 2024-03-13 MED ORDER — EMPAGLIFLOZIN 10 MG PO TABS
10.0000 mg | ORAL_TABLET | Freq: Every day | ORAL | 0 refills | Status: AC
Start: 2024-03-13 — End: ?

## 2024-03-13 NOTE — Progress Notes (Signed)
 Patient ID: Kaitlyn Cervantes, female    DOB: 1961-06-25  MRN: [id number]  CC: Annual Exam  Subjective: Kaitlyn Cervantes is a 63 y.o. female who presents for annual exam.   Her concerns today include:  - Doing well on Lisinopril , no issues/concerns. She does not complain of red flag symptoms such as but not limited to chest pain, shortness of breath, worst headache of life, nausea/vomiting. States she has upcoming appointment with Cardiology.  - Doing well on Empagliflozin  and Tirzepatide , no issues/concerns. Denies red flag symptoms associated with diabetes.  - States she is up to date on diabetic eye exam.  - States she is up to date on diabetic foot exam.  - States she is up to date on mammogram. - Due for cervical cancer screening. - Patient up to date on colon cancer screening per [facility].   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     benzonatate  (TESSALON ) 100 MG capsule Take 1 capsule (100 mg total) by mouth 3 (three) times daily as needed for cough. 30 capsule 0   Blood Glucose Monitoring Suppl (ONETOUCH VERIO) w/Device KIT 1 kit by Does not apply route every morning. 1 kit 1   bumetanide (BUMEX) 1 MG  tablet Take 1 mg by mouth daily.      carvedilol (COREG) 6.25 MG tablet Take 6.25 mg by mouth 2 (two) times daily with a meal.      cetirizine  (ZYRTEC  ALLERGY) 10 MG tablet Take 1 tablet (10 mg total) by mouth daily. 30 tablet 0   Cholecalciferol (VITAMIN D3) 5000 units TABS Take 5,000 Units by mouth daily.     ciclopirox  (PENLAC ) 8 % solution Apply topically at bedtime. Apply over nail and surrounding skin. Apply daily over previous coat. After seven (7) days, may remove with alcohol and continue cycle. 6.6 mL 2   Coenzyme Q10 (COQ10) 100 MG CAPS Take 1 capsule by mouth daily.     Garlic 1000 MG CAPS Take 1 capsule by mouth daily.     glucose blood (ONETOUCH VERIO) test strip Check blood sugar 3 x daily 200 each 3   ipratropium (ATROVENT ) 0.03 % nasal spray Place 2 sprays into both nostrils 2 (two) times daily. 30 mL 0   ketoconazole  (NIZORAL ) 2 % cream Apply 1 Application topically 2 (two) times daily. 60 g 2   Lancet Devices (ONE TOUCH DELICA LANCING DEV) MISC 1 application by Does not apply route every morning. 100 each 1   lisinopril  (ZESTRIL ) 40 MG tablet Take 1 tablet (40 mg total) by mouth daily. 30 tablet 0   Multiple Vitamin (MULTIVITAMIN WITH MINERALS) TABS tablet Take 1 tablet by mouth daily.     ONETOUCH DELICA LANCETS FINE MISC  1 application by Does not apply route 2 (two) times daily. 100 each 3   promethazine -dextromethorphan (PROMETHAZINE -DM) 6.25-15 MG/5ML syrup Take 5 mLs by mouth 3 (three) times daily as needed for cough. 200 mL 0   rosuvastatin  (CRESTOR ) 10 MG tablet Take 1 tablet (10 mg total) by mouth daily. 90 tablet 3   spironolactone (ALDACTONE) 25 MG tablet Take 25 mg by mouth daily.      Current Facility-Administered Medications on File Prior to Visit  Medication Dose Route Frequency Provider Last Rate Last Admin   0.9 %  sodium chloride  infusion  500 mL Intravenous Continuous Aneita Gwendlyn DASEN, MD        Allergies  Allergen Reactions   Iohexol  Hives    iodine    Other Itching and Swelling    Nuts excludes peanuts    Social History   Socioeconomic History   Marital status: Widowed    Spouse name: Not on file   Number of children: Not on file   Years of education: Not on file   Highest education level: Not on file  Occupational History   Not on file  Tobacco Use   Smoking status: Never    Passive exposure: Never   Smokeless tobacco: Never  Vaping Use   Vaping status: Never Used  Substance and Sexual Activity   Alcohol use: No    Comment: very rare   Drug use: No   Sexual activity: Not Currently    Birth control/protection: Post-menopausal    Comment: 1st intercourse- 20, partners- 1, widow  Other Topics Concern   Not on file  Social History Narrative   Not on file   Social Drivers of Health   Financial Resource Strain: Low Risk  (03/13/2024)   Overall Financial Resource Strain (CARDIA)    Difficulty of Paying Living Expenses: Not hard at all  Food Insecurity: No Food Insecurity (03/13/2024)   Hunger Vital Sign    Worried About Running Out of Food in the Last Year: Never true    Ran Out of Food in the Last Year: Never true  Transportation Needs: No Transportation Needs (03/13/2024)   PRAPARE - Administrator, Civil Service (Medical): No    Lack of Transportation (Non-Medical): No  Physical Activity: Insufficiently Active (03/13/2024)   Exercise Vital Sign    Days of Exercise per Week: 1 day    Minutes of Exercise per Session: 30 min  Stress: No Stress Concern Present (03/13/2024)   [employer] of Occupational Health - Occupational Stress Questionnaire    Feeling of Stress: Not at all  Social Connections: Moderately Integrated (03/13/2024)   Social Connection and Isolation Panel    Frequency of Communication with Friends and Family: More than three times a week    Frequency of Social Gatherings with Friends and Family: More than three times a week    Attends Religious Services: More than 4 times per year    Active  Member of [employer] or Organizations: Yes    Attends Banker Meetings: More than 4 times per year    Marital Status: Widowed  Intimate Partner Violence: Not At Risk (03/13/2024)   Humiliation, Afraid, Rape, and Kick questionnaire    Fear of Current or Ex-Partner: No    Emotionally Abused: No    Physically Abused: No    Sexually Abused: No    Family History  Problem Relation Age of Onset   Breast cancer Mother        x2  40's & 5's   Cancer Mother    Diabetes Mother    Hypertension Mother    Heart disease Father    Early death Father    Cancer Sister    Cancer Maternal Aunt    Breast cancer Maternal Aunt        mid 36's   Diabetes Brother    Depression Brother    Drug abuse Brother    Mental illness Brother    Colon polyps Neg Hx    Colon cancer Neg Hx    Esophageal cancer Neg Hx    Rectal cancer Neg Hx    Stomach cancer Neg Hx     Past Surgical History:  Procedure Laterality Date   BREAST BIOPSY     CESAREAN SECTION     x3   kidney stones  2007   right mastectomy     07/2016   SPLENECTOMY     age 2     ROS: Review of Systems Negative except as stated above  PHYSICAL EXAM: BP (!) 154/76   Pulse 62   Temp 98.1 F (36.7 C) (Oral)   Resp 16   Ht 5' 4 (1.626 m)   Wt 184 lb (83.5 kg)   SpO2 98%   BMI 31.58 kg/m      03/13/2024    3:23 PM 03/13/2024    2:20 PM 01/18/2024    3:42 PM  Vitals with BMI  Height  5' 4 5' 4  Weight  184 lbs 188 lbs  BMI  31.57 32.25  Systolic 154 144   Diastolic 76 80   Pulse  62    Physical Exam HENT:     Head: Normocephalic and atraumatic.     Right Ear: Tympanic membrane, ear canal and external ear normal.     Left Ear: Tympanic membrane, ear canal and external ear normal.     Nose: Nose normal.     Mouth/Throat:     Mouth: Mucous membranes are moist.     Pharynx: Oropharynx is clear.  Eyes:     Extraocular Movements: Extraocular movements intact.     Conjunctiva/sclera: Conjunctivae normal.     Pupils:  Pupils are equal, round, and reactive to light.  Neck:     Thyroid : No thyroid  mass, thyromegaly or thyroid  tenderness.  Cardiovascular:     Rate and Rhythm: Normal rate and regular rhythm.     Pulses: Normal pulses.     Heart sounds: Normal heart sounds.  Pulmonary:     Effort: Pulmonary effort is normal.     Breath sounds: Normal breath sounds.  Chest:     Comments: Patient declined. Abdominal:     General: Bowel sounds are normal.     Palpations: Abdomen is soft.  Genitourinary:    Comments: Patient declined. Musculoskeletal:        General: Normal range of motion.     Right shoulder: Normal.     Left shoulder: Normal.     Right upper arm: Normal.     Left upper arm: Normal.     Right elbow: Normal.     Left elbow: Normal.     Right forearm: Normal.     Left forearm: Normal.     Right wrist: Normal.     Left wrist: Normal.     Right hand: Normal.     Left hand: Normal.     Cervical back: Normal, normal range of motion and neck supple.     Thoracic  back: Normal.     Lumbar back: Normal.     Right hip: Normal.     Left hip: Normal.     Right upper leg: Normal.     Left upper leg: Normal.     Right knee: Normal.     Left knee: Normal.     Right lower leg: Normal.     Left lower leg: Normal.     Right ankle: Normal.     Left ankle: Normal.     Right foot: Normal.     Left foot: Normal.  Skin:    General: Skin is warm and dry.     Capillary Refill: Capillary refill takes less than 2 seconds.  Neurological:     General: No focal deficit present.     Mental Status: She is alert and oriented to person, place, and time.  Psychiatric:        Mood and Affect: Mood normal.        Behavior: Behavior normal.     ASSESSMENT AND PLAN: 1. Annual physical exam (Primary) - Counseled on 150 minutes of exercise per week as tolerated, healthy eating (including decreased daily intake of saturated fats, cholesterol, added sugars, sodium), STI prevention, and routine healthcare  maintenance.  2. Screening for metabolic disorder - Routine screening.  - CMP14+EGFR  3. Screening for deficiency anemia - Routine screening.  - CBC  4. Screening cholesterol level - Routine screening.  - Lipid panel  5. Thyroid  disorder screen - Routine screening.  - TSH  6. Breast cancer screening declined - Patient declined.  7. Cervical cancer screening - Referral to Gynecology for evaluation/management. - Ambulatory referral to Gynecology  8. Primary hypertension - Blood pressure not at goal during today's visit. Patient asymptomatic without chest pressure, chest pain, palpitations, shortness of breath, worst headache of life, and any additional red flag symptoms. - Patient declined pharmacological adjustments.  - Continue Lisinopril  as prescribed. No refills needed as of present.  - Counseled on blood pressure goal of less than 130/80, low-sodium, DASH diet, medication compliance, and 150 minutes of moderate intensity exercise per week as tolerated. Counseled on medication adherence and adverse effects. - Keep all scheduled appointments with established Cardiology. - Follow-up with primary provider as scheduled.   9. Type 2 diabetes mellitus with hyperglycemia, without long-term current use of insulin (HCC) - Hemoglobin A1c result pending.  - Continue Empagliflozin  and Tirzepatide  as prescribed.  - Discussed the importance of healthy eating habits, low-carbohydrate diet, low-sugar diet, regular aerobic exercise (at least 150 minutes a week as tolerated) and medication compliance to achieve or maintain control of diabetes. Counseled on medication adherence/adverse effects.  - Follow-up with primary provider as scheduled.  - Hemoglobin A1c - tirzepatide  (MOUNJARO ) 2.5 MG/0.5ML Pen; Inject 2.5 mg into the skin once a week.  Dispense: 6 mL; Refill: 0 - empagliflozin  (JARDIANCE ) 10 MG TABS tablet; Take 1 tablet (10 mg total) by mouth daily before breakfast.  Dispense: 90  tablet; Refill: 0  10. Diabetic eye exam (HCC) - Patient declined.  11. Encounter for diabetic foot exam (HCC) - Patient declined.    Patient was given the opportunity to ask questions.  Patient verbalized understanding of the plan and was able to repeat key elements of the plan. Patient was given clear instructions to go to Emergency Department or return to medical center if symptoms don't improve, worsen, or new problems develop.The patient verbalized understanding.   Orders Placed This Encounter  Procedures   CBC  Lipid panel   CMP14+EGFR   Hemoglobin A1c   TSH   Ambulatory referral to Gynecology     Requested Prescriptions   Signed Prescriptions Disp Refills   tirzepatide  (MOUNJARO ) 2.5 MG/0.5ML Pen 6 mL 0    Sig: Inject 2.5 mg into the skin once a week.   empagliflozin  (JARDIANCE ) 10 MG TABS tablet 90 tablet 0    Sig: Take 1 tablet (10 mg total) by mouth daily before breakfast.    Return in about 1 year (around 03/13/2025) for Physical per patient preference.  Greig JINNY Drones, NP

## 2024-03-14 ENCOUNTER — Ambulatory Visit: Payer: Self-pay | Admitting: Family

## 2024-03-14 ENCOUNTER — Telehealth: Payer: Self-pay | Admitting: Emergency Medicine

## 2024-03-14 ENCOUNTER — Telehealth: Payer: Self-pay

## 2024-03-14 DIAGNOSIS — N1831 Chronic kidney disease, stage 3a: Secondary | ICD-10-CM

## 2024-03-14 DIAGNOSIS — Z13220 Encounter for screening for lipoid disorders: Secondary | ICD-10-CM

## 2024-03-14 LAB — CBC
Hematocrit: 38 % (ref 34.0–46.6)
Hemoglobin: 11.7 g/dL (ref 11.1–15.9)
MCH: 26 pg — ABNORMAL LOW (ref 26.6–33.0)
MCHC: 30.8 g/dL — ABNORMAL LOW (ref 31.5–35.7)
MCV: 84 fL (ref 79–97)
Platelets: 341 x10E3/uL (ref 150–450)
RBC: 4.5 x10E6/uL (ref 3.77–5.28)
RDW: 14.9 % (ref 11.7–15.4)
WBC: 4 x10E3/uL (ref 3.4–10.8)

## 2024-03-14 LAB — TSH: TSH: 1.54 u[IU]/mL (ref 0.450–4.500)

## 2024-03-14 LAB — LIPID PANEL
Chol/HDL Ratio: 3.3 ratio (ref 0.0–4.4)
Cholesterol, Total: 197 mg/dL (ref 100–199)
HDL: 59 mg/dL (ref >39)
LDL Chol Calc (NIH): 128 mg/dL — ABNORMAL HIGH (ref 0–99)
Triglycerides: 53 mg/dL (ref 0–149)
VLDL Cholesterol Cal: 10 mg/dL (ref 5–40)

## 2024-03-14 LAB — HEMOGLOBIN A1C
Est. average glucose Bld gHb Est-mCnc: 123 mg/dL
Hgb A1c MFr Bld: 5.9 % — ABNORMAL HIGH (ref 4.8–5.6)

## 2024-03-14 LAB — CMP14+EGFR
ALT: 9 IU/L (ref 0–32)
AST: 17 IU/L (ref 0–40)
Albumin: 4.2 g/dL (ref 3.9–4.9)
Alkaline Phosphatase: 108 IU/L (ref 44–121)
BUN/Creatinine Ratio: 9 — ABNORMAL LOW (ref 12–28)
BUN: 12 mg/dL (ref 8–27)
Bilirubin Total: 0.3 mg/dL (ref 0.0–1.2)
CO2: 20 mmol/L (ref 20–29)
Calcium: 9.9 mg/dL (ref 8.7–10.3)
Chloride: 101 mmol/L (ref 96–106)
Creatinine, Ser: 1.3 mg/dL — ABNORMAL HIGH (ref 0.57–1.00)
Globulin, Total: 3.4 g/dL (ref 1.5–4.5)
Glucose: 76 mg/dL (ref 70–99)
Potassium: 5 mmol/L (ref 3.5–5.2)
Sodium: 138 mmol/L (ref 134–144)
Total Protein: 7.6 g/dL (ref 6.0–8.5)
eGFR: 46 mL/min/1.73 — ABNORMAL LOW (ref >59)

## 2024-03-14 NOTE — Telephone Encounter (Signed)
 I called patient to see if she could come back in to get some lab work done because a CMA was stuck by a need that was used in collecting her labs, no one answered so I left a voicemail for her to return my call.

## 2024-03-14 NOTE — Telephone Encounter (Signed)
 Copied from CRM [id number]. Topic: Clinical - Lab/Test Results >> Mar 14, 2024 12:57 PM Fonda T wrote: Reason for CRM: Received call from patient, states she is returning call to office, [place], to discuss lab results in detail.  Patient can be reached back at [phone].   Per patient will also try to call back later, as she has to return to work soon.

## 2024-03-15 ENCOUNTER — Telehealth: Payer: Self-pay

## 2024-03-15 NOTE — Telephone Encounter (Signed)
 Copied from CRM [id number]. Topic: Clinical - Lab/Test Results >> Mar 14, 2024  4:48 PM Delon HERO wrote: Reason for CRM: Patient is calling back for lab results. Patient is calling to ask if the monjaro with be increased from 2.5 to 5mg . Please advise.  Second call

## 2024-03-18 NOTE — Telephone Encounter (Signed)
 Call patient with update. Please provide patient with lab result notes from 03/14/2024 10:59 AM EDT. Continue Tirzepatide  2.5 mg prescribed (03/13/2024  3:26 PM EDT).

## 2024-03-19 ENCOUNTER — Telehealth: Payer: Self-pay

## 2024-03-19 NOTE — Telephone Encounter (Signed)
 Noted

## 2024-03-19 NOTE — Telephone Encounter (Signed)
 Copied from CRM [id number]. Topic: Clinical - Medication Question >> Mar 18, 2024 12:51 PM Kaitlyn Cervantes wrote: Reason for CRM:  Patient would like to increase the tirzepatide  to 5 mg.  Please advise.

## 2024-03-19 NOTE — Telephone Encounter (Signed)
 Copied from CRM [id number]. Topic: Clinical - Lab/Test Results >> Mar 19, 2024 11:12 AM Ivette P wrote: Reason for CRM: PT called in about her lab results.   I saw notes and attempted to read results to pt. Pt stated she has been told several times and something is missing and wanted to speka to [place] herself.    Called [facility] and spoke with [place] and nurse was in clinic.    Pt is at work and will not answer call back but is requesting for nurse Purvis to leave a detailed voicemail so pt does not have to call back.

## 2024-03-19 NOTE — Telephone Encounter (Signed)
 Hemoglobin A1c at goal 5.9% on 03/13/2024 with Tirzepatide  2.5 mg. Goal 7%. Schedule appointment for further discussion.

## 2024-04-24 ENCOUNTER — Other Ambulatory Visit: Payer: Self-pay

## 2024-04-26 NOTE — Progress Notes (Signed)
 Kaitlyn Cervantes                                          MRN: [id number]   04/26/2024   The [facility] Quality Team Specialist reviewed this patient medical record for the purposes of chart review for care gap closure. The following were reviewed: abstraction for care gap closure-glycemic status assessment.    [facility] Quality Team

## 2024-05-01 ENCOUNTER — Encounter: Payer: Self-pay | Admitting: Family

## 2024-05-14 ENCOUNTER — Inpatient Hospital Stay: Payer: Federal, State, Local not specified - PPO | Admitting: Hematology and Oncology

## 2024-06-11 ENCOUNTER — Inpatient Hospital Stay: Attending: Hematology and Oncology | Admitting: Hematology and Oncology

## 2024-06-11 VITALS — BP 148/78 | HR 68 | Temp 97.4°F | Resp 16 | Ht 64.0 in | Wt 186.8 lb

## 2024-06-11 DIAGNOSIS — Z9011 Acquired absence of right breast and nipple: Secondary | ICD-10-CM | POA: Diagnosis not present

## 2024-06-11 DIAGNOSIS — E041 Nontoxic single thyroid nodule: Secondary | ICD-10-CM | POA: Diagnosis not present

## 2024-06-11 DIAGNOSIS — Z1722 Progesterone receptor negative status: Secondary | ICD-10-CM | POA: Diagnosis not present

## 2024-06-11 DIAGNOSIS — N9489 Other specified conditions associated with female genital organs and menstrual cycle: Secondary | ICD-10-CM | POA: Diagnosis not present

## 2024-06-11 DIAGNOSIS — N200 Calculus of kidney: Secondary | ICD-10-CM | POA: Diagnosis not present

## 2024-06-11 DIAGNOSIS — N644 Mastodynia: Secondary | ICD-10-CM | POA: Insufficient documentation

## 2024-06-11 DIAGNOSIS — C50211 Malignant neoplasm of upper-inner quadrant of right female breast: Secondary | ICD-10-CM | POA: Insufficient documentation

## 2024-06-11 DIAGNOSIS — E042 Nontoxic multinodular goiter: Secondary | ICD-10-CM | POA: Insufficient documentation

## 2024-06-11 DIAGNOSIS — Z1732 Human epidermal growth factor receptor 2 negative status: Secondary | ICD-10-CM | POA: Diagnosis not present

## 2024-06-11 DIAGNOSIS — Z79899 Other long term (current) drug therapy: Secondary | ICD-10-CM | POA: Diagnosis not present

## 2024-06-11 DIAGNOSIS — Z7982 Long term (current) use of aspirin: Secondary | ICD-10-CM | POA: Insufficient documentation

## 2024-06-11 DIAGNOSIS — Z171 Estrogen receptor negative status [ER-]: Secondary | ICD-10-CM | POA: Diagnosis not present

## 2024-06-11 DIAGNOSIS — N950 Postmenopausal bleeding: Secondary | ICD-10-CM | POA: Insufficient documentation

## 2024-06-11 NOTE — Assessment & Plan Note (Signed)
 Previously followed with [facility] oncology with Dr. Phillip Bellini. 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  Bilateral renal calculi, submucosal fibrofatty infiltration gastric antrum could be from chronic gastritis pelvic congestion syndrome, nodular thyroid  enlargement   Plan: GYN follow-up regarding the pelvic congestion syndrome Thyroid  ultrasound 07/03/2023: Multinodular goiter.  2 nodules in the left thyroid  lobe meet criteria for imaging follow-up.  1 year follow-up recommended. GI follow-up regarding the gastric antrum changes  Breast cancer surveillance: Breast exam 06/11/2024: Benign Mammogram left breast:

## 2024-06-11 NOTE — Progress Notes (Signed)
 Patient Care Team: Jaycee Greig PARAS, NP as PCP - General (Nurse Practitioner) Curvin Deward MOULD, MD as Consulting Physician (General Surgery) Shannon Agent, MD as Consulting Physician (Radiation Oncology) Moses Powell Hummer, NP as Nurse Practitioner (Hematology and Oncology) Ottelin, Mark, MD (Inactive) as Consulting Physician (Urology)  DIAGNOSIS:  Encounter Diagnosis  Name Primary?   Malignant neoplasm of upper-inner quadrant of right breast in female, estrogen receptor negative (HCC) Yes    CHIEF COMPLIANT: Surveillance of breast cancer  HISTORY OF PRESENT ILLNESS: History of Present Illness Kaitlyn Cervantes is a 63 year old female with a history of breast cancer who presents for follow-up and surveillance.  She has occasional post-menopausal bleeding. Pelvic congestion was seen on CT last year. She has not yet seen a gynecologist and does not have one currently.  She has gastric mucosal thickening on CT from a year ago and prior ulcers. She is currently asymptomatic.  She had a right mastectomy and has dense type C breast tissue. She has intermittent, nonpersistent breast pain. She had a mammogram on July 14.  She has thyroid  nodules. An ultrasound a year ago recommended repeat imaging at one year. She notes a new prominence on one side of her neck.     ALLERGIES:  is allergic to iohexol  and other.  MEDICATIONS:  Current Outpatient Medications  Medication Sig Dispense Refill   aspirin EC 81 MG tablet Take 81 mg by mouth daily.     benzonatate  (TESSALON ) 100 MG capsule Take 1 capsule (100 mg total) by mouth 3 (three) times daily as needed for cough. 30 capsule 0   Blood Glucose Monitoring Suppl (ONETOUCH VERIO) w/Device KIT 1 kit by Does not apply route every morning. 1 kit 1   bumetanide (BUMEX) 1 MG tablet Take 1 mg by mouth daily.      carvedilol (COREG) 6.25 MG tablet Take 6.25 mg by mouth 2 (two) times daily with a meal.      cetirizine  (ZYRTEC  ALLERGY) 10 MG tablet  Take 1 tablet (10 mg total) by mouth daily. 30 tablet 0   Cholecalciferol (VITAMIN D3) 5000 units TABS Take 5,000 Units by mouth daily.     ciclopirox  (PENLAC ) 8 % solution Apply topically at bedtime. Apply over nail and surrounding skin. Apply daily over previous coat. After seven (7) days, may remove with alcohol and continue cycle. 6.6 mL 2   Coenzyme Q10 (COQ10) 100 MG CAPS Take 1 capsule by mouth daily.     empagliflozin  (JARDIANCE ) 10 MG TABS tablet Take 1 tablet (10 mg total) by mouth daily before breakfast. 90 tablet 0   Garlic 1000 MG CAPS Take 1 capsule by mouth daily.     glucose blood (ONETOUCH VERIO) test strip Check blood sugar 3 x daily 200 each 3   ipratropium (ATROVENT ) 0.03 % nasal spray Place 2 sprays into both nostrils 2 (two) times daily. 30 mL 0   Lancet Devices (ONE TOUCH DELICA LANCING DEV) MISC 1 application by Does not apply route every morning. 100 each 1   lisinopril  (ZESTRIL ) 40 MG tablet Take 1 tablet (40 mg total) by mouth daily. 30 tablet 0   Multiple Vitamin (MULTIVITAMIN WITH MINERALS) TABS tablet Take 1 tablet by mouth daily.     ONETOUCH DELICA LANCETS FINE MISC 1 application by Does not apply route 2 (two) times daily. 100 each 3   promethazine -dextromethorphan (PROMETHAZINE -DM) 6.25-15 MG/5ML syrup Take 5 mLs by mouth 3 (three) times daily as needed for cough. 200 mL  0   rosuvastatin  (CRESTOR ) 10 MG tablet Take 1 tablet (10 mg total) by mouth daily. 90 tablet 3   spironolactone (ALDACTONE) 25 MG tablet Take 25 mg by mouth daily.      tirzepatide  (MOUNJARO ) 2.5 MG/0.5ML Pen Inject 2.5 mg into the skin once a week. 6 mL 0   Current Facility-Administered Medications  Medication Dose Route Frequency Provider Last Rate Last Admin   0.9 %  sodium chloride  infusion  500 mL Intravenous Continuous Aneita Gwendlyn DASEN, MD        PHYSICAL EXAMINATION: ECOG PERFORMANCE STATUS: 1 - Symptomatic but completely ambulatory  Vitals:   06/11/24 1413  BP: (!) 148/78  Pulse:  68  Resp: 16  Temp: (!) 97.4 F (36.3 C)  SpO2: 100%   Filed Weights   06/11/24 1413  Weight: 186 lb 12.8 oz (84.7 kg)    Physical Exam NECK: Thyroid  nodules palpable with asymmetrical prominence.  (exam performed in the presence of a chaperone)  LABORATORY DATA:  I have reviewed the data as listed    Latest Ref Rng & Units 03/13/2024    3:40 PM 08/10/2023   10:13 AM 03/23/2023    2:14 PM  CMP  Glucose 70 - 99 mg/dL 76  878  858   BUN 8 - 27 mg/dL 12  24  28    Creatinine 0.57 - 1.00 mg/dL 8.69  8.30  8.57   Sodium 134 - 144 mmol/L 138  139  136   Potassium 3.5 - 5.2 mmol/L 5.0  4.6  3.9   Chloride 96 - 106 mmol/L 101  103  104   CO2 20 - 29 mmol/L 20  21  25    Calcium  8.7 - 10.3 mg/dL 9.9  9.6  9.7   Total Protein 6.0 - 8.5 g/dL 7.6   8.0   Total Bilirubin 0.0 - 1.2 mg/dL 0.3   0.4   Alkaline Phos 44 - 121 IU/L 108   85   AST 0 - 40 IU/L 17   17   ALT 0 - 32 IU/L 9   16     Lab Results  Component Value Date   WBC 4.0 03/13/2024   HGB 11.7 03/13/2024   HCT 38.0 03/13/2024   MCV 84 03/13/2024   PLT 341 03/13/2024   NEUTROABS 2.4 03/23/2023    ASSESSMENT & PLAN:  Malignant neoplasm of upper-inner quadrant of right female breast [facility]) Previously followed with [facility] oncology with Dr. Phillip Bellini. 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  Bilateral renal calculi, submucosal fibrofatty infiltration gastric antrum could be from chronic gastritis pelvic congestion syndrome, nodular thyroid  enlargement   Plan: GYN follow-up regarding the pelvic congestion syndrome Thyroid  ultrasound 07/03/2023: Multinodular goiter.  2 nodules in the left thyroid  lobe meet criteria for imaging follow-up.  1 year follow-up recommended. GI follow-up regarding the  gastric antrum changes  Breast cancer surveillance: Breast exam 06/11/2024: Benign Mammogram left breast: July 2025 at [facility]: Benign ------------------------------------- Assessment and Plan Assessment & Plan Malignant neoplasm of upper-inner quadrant of right breast, estrogen receptor negative Discussed Signatera blood test for early detection of recurrence, covered by most insurances. - Provided brochure on Signatera blood test. - Continue annual mammograms.  Nontoxic single thyroid  nodule Thyroid  ultrasound recommended in one year due to prominence likely  from nodules. - Ordered thyroid  ultrasound. - Referred to endocrinologist for evaluation.  Postmenopausal bleeding Intermittent bleeding requires gynecological evaluation. - Referred to gynecologist for evaluation.  Pelvic congestion syndrome Noted on CT scan, requires gynecological evaluation. - Referred to gynecologist for evaluation.      No orders of the defined types were placed in this encounter.  The patient has a good understanding of the overall plan. she agrees with it. she will call with any problems that may develop before the next visit here.  I personally spent a total of 30 minutes in the care of the patient today including preparing to see the patient, getting/reviewing separately obtained history, performing a medically appropriate exam/evaluation, counseling and educating, placing orders, referring and communicating with other health care professionals, documenting clinical information in the EHR, independently interpreting results, communicating results, and coordinating care.   Viinay K Dashton Czerwinski, MD 06/11/24

## 2024-06-14 DIAGNOSIS — R0609 Other forms of dyspnea: Secondary | ICD-10-CM | POA: Diagnosis not present

## 2024-06-14 DIAGNOSIS — I2510 Atherosclerotic heart disease of native coronary artery without angina pectoris: Secondary | ICD-10-CM | POA: Diagnosis not present

## 2024-06-14 DIAGNOSIS — E1169 Type 2 diabetes mellitus with other specified complication: Secondary | ICD-10-CM | POA: Diagnosis not present

## 2024-06-14 DIAGNOSIS — E7800 Pure hypercholesterolemia, unspecified: Secondary | ICD-10-CM | POA: Diagnosis not present

## 2024-06-20 ENCOUNTER — Encounter: Payer: Self-pay | Admitting: Family

## 2024-06-20 ENCOUNTER — Ambulatory Visit: Admitting: Family

## 2024-06-20 ENCOUNTER — Telehealth: Payer: Self-pay | Admitting: Emergency Medicine

## 2024-06-20 VITALS — BP 133/76 | HR 75 | Temp 98.6°F | Resp 16 | Ht 64.0 in | Wt 183.8 lb

## 2024-06-20 DIAGNOSIS — Z7985 Long-term (current) use of injectable non-insulin antidiabetic drugs: Secondary | ICD-10-CM | POA: Diagnosis not present

## 2024-06-20 DIAGNOSIS — E1165 Type 2 diabetes mellitus with hyperglycemia: Secondary | ICD-10-CM

## 2024-06-20 DIAGNOSIS — Z1321 Encounter for screening for nutritional disorder: Secondary | ICD-10-CM

## 2024-06-20 DIAGNOSIS — Z7984 Long term (current) use of oral hypoglycemic drugs: Secondary | ICD-10-CM | POA: Diagnosis not present

## 2024-06-20 DIAGNOSIS — E119 Type 2 diabetes mellitus without complications: Secondary | ICD-10-CM

## 2024-06-20 DIAGNOSIS — Z0189 Encounter for other specified special examinations: Secondary | ICD-10-CM

## 2024-06-20 LAB — POCT GLYCOSYLATED HEMOGLOBIN (HGB A1C): Hemoglobin A1C: 6 % — AB (ref 4.0–5.6)

## 2024-06-20 MED ORDER — EMPAGLIFLOZIN 10 MG PO TABS: 10.0000 mg | ORAL_TABLET | Freq: Every day | ORAL | 0 refills | Status: AC

## 2024-06-20 MED ORDER — MOUNJARO 2.5 MG/0.5ML ~~LOC~~ SOAJ: 2.5000 mg | SUBCUTANEOUS | 0 refills | Status: AC

## 2024-06-20 NOTE — Progress Notes (Signed)
 Patient needs her moujaro increased

## 2024-06-20 NOTE — Telephone Encounter (Signed)
 I called patient because today when she was at appointment she was supposed to leave a urine sample and when I went get it out the window it was not there

## 2024-06-20 NOTE — Progress Notes (Signed)
 Patient ID: Kaitlyn Cervantes, female    DOB: Mar 23, 1961  MRN: [id number]  CC: Chronic Conditions Follow-Up  Subjective: Kaitlyn Cervantes is a 63 y.o. female who presents for chronic conditions follow-up.  Her concerns today include:  - Doing well on Tirzepatide  and Empagliflozin , no issues/concerns. States she would like Tirzepatide  dose increased. I discussed with patient in detail will collect hemoglobin A1c today and once lab results if medically indicated will increase Tirzepatide  at that time. Patient verbalized understanding/agreement. Denies red flag symptoms associated with diabetes.  - Due for diabetic eye exam. - Due for diabetic foot exam.  - Established with Cardiology for chronic conditions management.  - Vitamin D  lab.  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 02/25/2016   Genetic counseling and testing 11/04/2015   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/19/2015   Kidney stone 09/19/2014   Hyperlipidemia 10/30/2013   Type 2 diabetes mellitus without complication, without long-term current use of insulin (HCC) 10/29/2013   Essential hypertension, benign 10/29/2013   History of obstructive sleep apnea 10/29/2013     Medications Ordered Prior to Encounter[1]  Allergies[2]  Social History   Socioeconomic History   Marital status: Widowed    Spouse name: Not on file   Number of children: Not on file   Years of education: Not on file   Highest education level: Not on file  Occupational History   Not on file  Tobacco Use   Smoking status: Never    Passive exposure: Never   Smokeless tobacco: Never  Vaping Use   Vaping status: Never Used  Substance and Sexual Activity   Alcohol use:  No    Comment: very rare   Drug use: No   Sexual activity: Not Currently    Birth control/protection: Post-menopausal    Comment: 1st intercourse- 20, partners- 1, widow  Other Topics Concern   Not on file  Social History Narrative   Not on file   Social Drivers of Health   Tobacco Use: Low Risk (06/20/2024)   Patient History    Smoking Tobacco Use: Never    Smokeless Tobacco Use: Never    Passive Exposure: Never  Financial Resource Strain: Low Risk (03/13/2024)   Overall Financial Resource Strain (CARDIA)    Difficulty of Paying Living Expenses: Not hard at all  Food Insecurity: No Food Insecurity (03/13/2024)   Epic    Worried About Radiation Protection Practitioner of Food in the Last Year: Never true    Ran Out of Food in the Last Year: Never true  Transportation Needs: No Transportation Needs (03/13/2024)   Epic    Lack of Transportation (Medical): No    Lack of Transportation (Non-Medical): No  Physical Activity: Insufficiently Active (03/13/2024)   Exercise Vital Sign    Days of Exercise per Week: 1 day    Minutes of Exercise per Session: 30 min  Stress: No Stress Concern Present (03/13/2024)   [employer] of Occupational Health - Occupational Stress Questionnaire    Feeling of Stress: Not at all  Social Connections: Moderately Integrated (03/13/2024)   Social Connection and Isolation Panel    Frequency of Communication with Friends and Family: More than three times a week    Frequency of  Social Gatherings with Friends and Family: More than three times a week    Attends Religious Services: More than 4 times per year    Active Member of [employer] or Organizations: Yes    Attends Banker Meetings: More than 4 times per year    Marital Status: Widowed  Intimate Partner Violence: Not At Risk (03/13/2024)   Epic    Fear of Current or Ex-Partner: No    Emotionally Abused: No    Physically Abused: No    Sexually Abused: No  Depression (PHQ2-9): Low Risk (06/20/2024)   Depression  (PHQ2-9)    PHQ-2 Score: 0  Alcohol Screen: Low Risk (03/13/2024)   Alcohol Screen    Last Alcohol Screening Score (AUDIT): 1  Housing: Unknown (03/13/2024)   Epic    Unable to Pay for Housing in the Last Year: No    Number of Times Moved in the Last Year: Not on file    Homeless in the Last Year: No  Utilities: Not At Risk (03/13/2024)   Epic    Threatened with loss of utilities: No  Health Literacy: Adequate Health Literacy (03/13/2024)   B1300 Health Literacy    Frequency of need for help with medical instructions: Never    Family History  Problem Relation Age of Onset   Breast cancer Mother        x2 52's & 15's   Cancer Mother    Diabetes Mother    Hypertension Mother    Heart disease Father    Early death Father    Cancer Sister    Cancer Maternal Aunt    Breast cancer Maternal Aunt        mid 30's   Diabetes Brother    Depression Brother    Drug abuse Brother    Mental illness Brother    Colon polyps Neg Hx    Colon cancer Neg Hx    Esophageal cancer Neg Hx    Rectal cancer Neg Hx    Stomach cancer Neg Hx     Past Surgical History:  Procedure Laterality Date   BREAST BIOPSY     CESAREAN SECTION     x3   kidney stones  2007   right mastectomy     07/2016   SPLENECTOMY     age 30     ROS: Review of Systems Negative except as stated above  PHYSICAL EXAM: BP 133/76   Pulse 75   Temp 98.6 F (37 C) (Oral)   Resp 16   Ht 5' 4 (1.626 m)   Wt 183 lb 12.8 oz (83.4 kg)   SpO2 99%   BMI 31.55 kg/m   Physical Exam HENT:     Head: Normocephalic and atraumatic.     Nose: Nose normal.     Mouth/Throat:     Mouth: Mucous membranes are moist.     Pharynx: Oropharynx is clear.  Eyes:     Extraocular Movements: Extraocular movements intact.     Conjunctiva/sclera: Conjunctivae normal.     Pupils: Pupils are equal, round, and reactive to light.  Cardiovascular:     Rate and Rhythm: Normal rate and regular rhythm.     Pulses: Normal pulses.     Heart  sounds: Normal heart sounds.  Pulmonary:     Effort: Pulmonary effort is normal.     Breath sounds: Normal breath sounds.  Musculoskeletal:        General: Normal range of motion.  Cervical back: Normal range of motion and neck supple.  Neurological:     General: No focal deficit present.     Mental Status: She is alert and oriented to person, place, and time.  Psychiatric:        Mood and Affect: Mood normal.        Behavior: Behavior normal.     ASSESSMENT AND PLAN: 1. Type 2 diabetes mellitus with hyperglycemia, without long-term current use of insulin (HCC) (Primary) - Hemoglobin A1c result pending.  - Continue Tirzepatide  and Empagliflozin  as prescribed.  - Routine screening.  - Discussed the importance of healthy eating habits, low-carbohydrate diet, low-sugar diet, regular aerobic exercise (at least 150 minutes a week as tolerated) and medication compliance to achieve or maintain control of diabetes. Counseled on medication adherence/adverse effects. - Follow-up with primary provider as scheduled.  - Microalbumin / creatinine urine ratio - POCT glycosylated hemoglobin (Hb A1C); Future - tirzepatide  (MOUNJARO ) 2.5 MG/0.5ML Pen; Inject 2.5 mg into the skin once a week.  Dispense: 6 mL; Refill: 0 - empagliflozin  (JARDIANCE ) 10 MG TABS tablet; Take 1 tablet (10 mg total) by mouth daily before breakfast.  Dispense: 90 tablet; Refill: 0  2. Diabetic eye exam [facility]) - Referral to Ophthalmology for evaluation/management. - Ambulatory referral to Ophthalmology  3. Encounter for diabetic foot exam [facility]) - Referral to Podiatry for evaluation/management.  - Ambulatory referral to Podiatry  4. Encounter for vitamin deficiency screening - Routine screening.  - Vitamin D , 25-hydroxy   Patient was given the opportunity to ask questions.  Patient verbalized understanding of the plan and was able to repeat key elements of the plan. Patient was given clear instructions to go to  Emergency Department or return to medical center if symptoms don't improve, worsen, or new problems develop.The patient verbalized understanding.   Orders Placed This Encounter  Procedures   Microalbumin / creatinine urine ratio   Vitamin D , 25-hydroxy   Ambulatory referral to Ophthalmology   Ambulatory referral to Podiatry   POCT glycosylated hemoglobin (Hb A1C)     Requested Prescriptions   Signed Prescriptions Disp Refills   tirzepatide  (MOUNJARO ) 2.5 MG/0.5ML Pen 6 mL 0    Sig: Inject 2.5 mg into the skin once a week.   empagliflozin  (JARDIANCE ) 10 MG TABS tablet 90 tablet 0    Sig: Take 1 tablet (10 mg total) by mouth daily before breakfast.    Return in about 3 months (around 09/18/2024) for Follow-Up or next available chronic conditions.  Kaitlyn JINNY Chute, NP      [1]  Current Outpatient Medications on File Prior to Visit  Medication Sig Dispense Refill   aspirin EC 81 MG tablet Take 81 mg by mouth daily.     benzonatate  (TESSALON ) 100 MG capsule Take 1 capsule (100 mg total) by mouth 3 (three) times daily as needed for cough. 30 capsule 0   Blood Glucose Monitoring Suppl (ONETOUCH VERIO) w/Device KIT 1 kit by Does not apply route every morning. 1 kit 1   bumetanide (BUMEX) 1 MG tablet Take 1 mg by mouth daily.      carvedilol (COREG) 6.25 MG tablet Take 6.25 mg by mouth 2 (two) times daily with a meal.      cetirizine  (ZYRTEC  ALLERGY) 10 MG tablet Take 1 tablet (10 mg total) by mouth daily. 30 tablet 0   Cholecalciferol (VITAMIN D3) 5000 units TABS Take 5,000 Units by mouth daily.     ciclopirox  (PENLAC ) 8 % solution Apply topically at  bedtime. Apply over nail and surrounding skin. Apply daily over previous coat. After seven (7) days, may remove with alcohol and continue cycle. 6.6 mL 2   Coenzyme Q10 (COQ10) 100 MG CAPS Take 1 capsule by mouth daily.     Garlic 1000 MG CAPS Take 1 capsule by mouth daily.     glucose blood (ONETOUCH VERIO) test strip Check blood sugar 3 x  daily 200 each 3   ipratropium (ATROVENT ) 0.03 % nasal spray Place 2 sprays into both nostrils 2 (two) times daily. 30 mL 0   Lancet Devices (ONE TOUCH DELICA LANCING DEV) MISC 1 application by Does not apply route every morning. 100 each 1   lisinopril  (ZESTRIL ) 40 MG tablet Take 1 tablet (40 mg total) by mouth daily. 30 tablet 0   Multiple Vitamin (MULTIVITAMIN WITH MINERALS) TABS tablet Take 1 tablet by mouth daily.     ONETOUCH DELICA LANCETS FINE MISC 1 application by Does not apply route 2 (two) times daily. 100 each 3   promethazine -dextromethorphan (PROMETHAZINE -DM) 6.25-15 MG/5ML syrup Take 5 mLs by mouth 3 (three) times daily as needed for cough. 200 mL 0   rosuvastatin  (CRESTOR ) 10 MG tablet Take 1 tablet (10 mg total) by mouth daily. 90 tablet 3   spironolactone (ALDACTONE) 25 MG tablet Take 25 mg by mouth daily.      Current Facility-Administered Medications on File Prior to Visit  Medication Dose Route Frequency Provider Last Rate Last Admin   0.9 %  sodium chloride  infusion  500 mL Intravenous Continuous Aneita Gwendlyn DASEN, MD      [2]  Allergies Allergen Reactions   Iohexol  Hives    iodine   Other Itching and Swelling    Nuts excludes peanuts

## 2024-06-21 ENCOUNTER — Ambulatory Visit: Payer: Self-pay | Admitting: Family

## 2024-06-27 ENCOUNTER — Telehealth: Payer: Self-pay

## 2024-06-27 NOTE — Telephone Encounter (Signed)
 Called and spoke to pts daughter regarding pt needing to schedule US  after US  team failed to reach pt after multiple attempts. Pt daughter verbalized understanding and said she would notify her mother.

## 2024-06-28 ENCOUNTER — Telehealth: Payer: Self-pay

## 2024-06-28 NOTE — Telephone Encounter (Signed)
 Pharmacy Patient Advocate Encounter  Received notification from [facility] [employer] that Prior Authorization for MOUNJARO  has been APPROVED from 05/29/2024 to 06/28/2025

## 2024-07-01 ENCOUNTER — Encounter: Admitting: Family Medicine

## 2024-07-23 ENCOUNTER — Ambulatory Visit (HOSPITAL_COMMUNITY)
Admission: RE | Admit: 2024-07-23 | Discharge: 2024-07-23 | Disposition: A | Discharge status: Home / Self Care | Source: Ambulatory Visit | Attending: Hematology and Oncology | Admitting: Hematology and Oncology

## 2024-07-23 DIAGNOSIS — E041 Nontoxic single thyroid nodule: Secondary | ICD-10-CM | POA: Diagnosis present

## 2024-07-24 ENCOUNTER — Other Ambulatory Visit: Payer: Self-pay | Admitting: *Deleted

## 2024-07-24 DIAGNOSIS — E041 Nontoxic single thyroid nodule: Secondary | ICD-10-CM

## 2024-07-24 NOTE — Progress Notes (Addendum)
 Per MD request, orders placed for thyroid  biopsy.  RN attempt x1 to contact pt.  No answer, LVM.

## 2024-07-25 ENCOUNTER — Telehealth: Payer: Self-pay | Admitting: *Deleted

## 2024-07-25 NOTE — Telephone Encounter (Signed)
 RN attempt x1 to contact pt regarding thyroid  US  results.  No answer.  Unable to LVM due to mailbox being full.

## 2024-07-26 ENCOUNTER — Encounter: Payer: Self-pay | Admitting: *Deleted

## 2024-07-26 ENCOUNTER — Telehealth: Payer: Self-pay | Admitting: *Deleted

## 2024-07-26 NOTE — Telephone Encounter (Signed)
 RN 3 rd attempt to contact pt regarding the need for thyroid  biopsy.  No answer. Unable to LVM due to voice mail box being full.  RN sent pt mychart message to return call to our office.

## 2024-08-06 ENCOUNTER — Inpatient Hospital Stay: Attending: Hematology and Oncology

## 2024-08-06 ENCOUNTER — Other Ambulatory Visit: Payer: Self-pay

## 2024-08-06 ENCOUNTER — Telehealth: Payer: Self-pay

## 2024-08-06 ENCOUNTER — Inpatient Hospital Stay: Attending: Hematology and Oncology | Admitting: Hematology and Oncology

## 2024-08-06 VITALS — BP 138/59 | HR 70 | Temp 98.3°F | Resp 16 | Ht 64.0 in | Wt 181.8 lb

## 2024-08-06 DIAGNOSIS — R519 Headache, unspecified: Secondary | ICD-10-CM | POA: Diagnosis not present

## 2024-08-06 DIAGNOSIS — C50211 Malignant neoplasm of upper-inner quadrant of right female breast: Secondary | ICD-10-CM | POA: Diagnosis not present

## 2024-08-06 DIAGNOSIS — E041 Nontoxic single thyroid nodule: Secondary | ICD-10-CM

## 2024-08-06 DIAGNOSIS — Z171 Estrogen receptor negative status [ER-]: Secondary | ICD-10-CM

## 2024-08-06 LAB — CMP (CANCER CENTER ONLY)
ALT: 18 U/L (ref 0–44)
AST: 20 U/L (ref 15–41)
Albumin: 3.9 g/dL (ref 3.5–5.0)
Alkaline Phosphatase: 105 U/L (ref 38–126)
Anion gap: 10 (ref 5–15)
BUN: 13 mg/dL (ref 8–23)
CO2: 25 mmol/L (ref 22–32)
Calcium: 9.8 mg/dL (ref 8.9–10.3)
Chloride: 102 mmol/L (ref 98–111)
Creatinine: 1.47 mg/dL — ABNORMAL HIGH (ref 0.44–1.00)
GFR, Estimated: 39 mL/min — ABNORMAL LOW
Glucose, Bld: 107 mg/dL — ABNORMAL HIGH (ref 70–99)
Potassium: 4.5 mmol/L (ref 3.5–5.1)
Sodium: 138 mmol/L (ref 135–145)
Total Bilirubin: 0.4 mg/dL (ref 0.0–1.2)
Total Protein: 8 g/dL (ref 6.5–8.1)

## 2024-08-06 LAB — CBC WITH DIFFERENTIAL (CANCER CENTER ONLY)
Abs Immature Granulocytes: 0.01 10*3/uL (ref 0.00–0.07)
Basophils Absolute: 0 10*3/uL (ref 0.0–0.1)
Basophils Relative: 1 %
Eosinophils Absolute: 0.1 10*3/uL (ref 0.0–0.5)
Eosinophils Relative: 2 %
HCT: 32.8 % — ABNORMAL LOW (ref 36.0–46.0)
Hemoglobin: 10.6 g/dL — ABNORMAL LOW (ref 12.0–15.0)
Immature Granulocytes: 0 %
Lymphocytes Relative: 29 %
Lymphs Abs: 1.6 10*3/uL (ref 0.7–4.0)
MCH: 26 pg (ref 26.0–34.0)
MCHC: 32.3 g/dL (ref 30.0–36.0)
MCV: 80.6 fL (ref 80.0–100.0)
Monocytes Absolute: 0.4 10*3/uL (ref 0.1–1.0)
Monocytes Relative: 8 %
Neutro Abs: 3.2 10*3/uL (ref 1.7–7.7)
Neutrophils Relative %: 60 %
Platelet Count: 350 10*3/uL (ref 150–400)
RBC: 4.07 MIL/uL (ref 3.87–5.11)
RDW: 13.6 % (ref 11.5–15.5)
WBC Count: 5.4 10*3/uL (ref 4.0–10.5)
nRBC: 0 % (ref 0.0–0.2)

## 2024-08-06 MED ORDER — PREDNISONE 50 MG PO TABS: ORAL_TABLET | ORAL | 0 refills | Status: AC

## 2024-08-06 NOTE — Progress Notes (Signed)
 "  Patient Care Team: Jaycee Greig PARAS, NP as PCP - General (Nurse Practitioner) Curvin Deward MOULD, MD as Consulting Physician (General Surgery) Shannon Agent, MD as Consulting Physician (Radiation Oncology) Moses Powell Hummer, NP as Nurse Practitioner (Hematology and Oncology) Ceil Anes, MD (Inactive) as Consulting Physician (Urology)  DIAGNOSIS:  Encounter Diagnosis  Name Primary?   Malignant neoplasm of upper-inner quadrant of right breast in female, estrogen receptor negative (HCC) Yes      CHIEF COMPLIANT: F/U to discuss treatment plan for thyroid  nodules  HISTORY OF PRESENT ILLNESS:   History of Present Illness Kaitlyn Cervantes is a 64 year old female with multinodular goiter presenting for evaluation of enlarging left thyroid  nodules.  Recent thyroid  ultrasound in December 2025 showed enlargement of two left thyroid  nodules (2.8 cm superior and 1.8 cm mid-thyroid ), both larger than on prior imaging, with most other nodules remaining under 1 cm. She has not had a biopsy and is awaiting endocrinology evaluation in May 2026 but is trying to be seen sooner. She is anxious about the nodule growth and possible cancer and is seeking clarification of the findings and management options.  She recently stopped tirzepatide  because of concern about thyroid -related adverse effects. She requests a full body scan including the head, noting that a prior scan a few months ago did not image the head. She recalls a prior adverse reaction to iodine-based contrast during kidney imaging that required medical attention. She denies new symptoms and focuses on the enlarging thyroid  nodules and desire for comprehensive evaluation and reassurance.       ALLERGIES:  is allergic to iohexol  and other.  MEDICATIONS:  Current Outpatient Medications  Medication Sig Dispense Refill   aspirin EC 81 MG tablet Take 81 mg by mouth daily.     benzonatate  (TESSALON ) 100 MG capsule Take 1 capsule (100 mg total)  by mouth 3 (three) times daily as needed for cough. 30 capsule 0   Blood Glucose Monitoring Suppl (ONETOUCH VERIO) w/Device KIT 1 kit by Does not apply route every morning. 1 kit 1   bumetanide (BUMEX) 1 MG tablet Take 1 mg by mouth daily.      carvedilol (COREG) 6.25 MG tablet Take 6.25 mg by mouth 2 (two) times daily with a meal.      cetirizine  (ZYRTEC  ALLERGY) 10 MG tablet Take 1 tablet (10 mg total) by mouth daily. 30 tablet 0   Cholecalciferol (VITAMIN D3) 5000 units TABS Take 5,000 Units by mouth daily.     ciclopirox  (PENLAC ) 8 % solution Apply topically at bedtime. Apply over nail and surrounding skin. Apply daily over previous coat. After seven (7) days, may remove with alcohol and continue cycle. 6.6 mL 2   Coenzyme Q10 (COQ10) 100 MG CAPS Take 1 capsule by mouth daily.     empagliflozin  (JARDIANCE ) 10 MG TABS tablet Take 1 tablet (10 mg total) by mouth daily before breakfast. 90 tablet 0   Garlic 1000 MG CAPS Take 1 capsule by mouth daily.     glucose blood (ONETOUCH VERIO) test strip Check blood sugar 3 x daily 200 each 3   ipratropium (ATROVENT ) 0.03 % nasal spray Place 2 sprays into both nostrils 2 (two) times daily. 30 mL 0   Lancet Devices (ONE TOUCH DELICA LANCING DEV) MISC 1 application by Does not apply route every morning. 100 each 1   lisinopril  (ZESTRIL ) 40 MG tablet Take 1 tablet (40 mg total) by mouth daily. 30 tablet 0   Multiple Vitamin (MULTIVITAMIN WITH  MINERALS) TABS tablet Take 1 tablet by mouth daily.     ONETOUCH DELICA LANCETS FINE MISC 1 application by Does not apply route 2 (two) times daily. 100 each 3   promethazine -dextromethorphan (PROMETHAZINE -DM) 6.25-15 MG/5ML syrup Take 5 mLs by mouth 3 (three) times daily as needed for cough. 200 mL 0   rosuvastatin  (CRESTOR ) 10 MG tablet Take 1 tablet (10 mg total) by mouth daily. 90 tablet 3   spironolactone (ALDACTONE) 25 MG tablet Take 25 mg by mouth daily.      tirzepatide  (MOUNJARO ) 2.5 MG/0.5ML Pen Inject 2.5 mg  into the skin once a week. 6 mL 0   Current Facility-Administered Medications  Medication Dose Route Frequency Provider Last Rate Last Admin   0.9 %  sodium chloride  infusion  500 mL Intravenous Continuous Aneita Gwendlyn DASEN, MD        PHYSICAL EXAMINATION: ECOG PERFORMANCE STATUS: 1 - Symptomatic but completely ambulatory  There were no vitals filed for this visit. There were no vitals filed for this visit.  Physical Exam   (exam performed in the presence of a chaperone)  LABORATORY DATA:  I have reviewed the data as listed    Latest Ref Rng & Units 03/13/2024    3:40 PM 08/10/2023   10:13 AM 03/23/2023    2:14 PM  CMP  Glucose 70 - 99 mg/dL 76  878  858   BUN 8 - 27 mg/dL 12  24  28    Creatinine 0.57 - 1.00 mg/dL 8.69  8.30  8.57   Sodium 134 - 144 mmol/L 138  139  136   Potassium 3.5 - 5.2 mmol/L 5.0  4.6  3.9   Chloride 96 - 106 mmol/L 101  103  104   CO2 20 - 29 mmol/L 20  21  25    Calcium  8.7 - 10.3 mg/dL 9.9  9.6  9.7   Total Protein 6.0 - 8.5 g/dL 7.6   8.0   Total Bilirubin 0.0 - 1.2 mg/dL 0.3   0.4   Alkaline Phos 44 - 121 IU/L 108   85   AST 0 - 40 IU/L 17   17   ALT 0 - 32 IU/L 9   16     Lab Results  Component Value Date   WBC 4.0 03/13/2024   HGB 11.7 03/13/2024   HCT 38.0 03/13/2024   MCV 84 03/13/2024   PLT 341 03/13/2024   NEUTROABS 2.4 03/23/2023    ASSESSMENT & PLAN:  Malignant neoplasm of upper-inner quadrant of right female breast [facility]) Previously followed with [facility] oncology with Dr. Phillip Bellini. 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  Bilateral renal calculi, submucosal fibrofatty infiltration gastric antrum could be from chronic gastritis pelvic congestion syndrome, nodular thyroid  enlargement   Plan: GYN  follow-up regarding the pelvic congestion syndrome Thyroid  ultrasound 07/03/2023: Multinodular goiter.  2 nodules in the left thyroid  lobe meet criteria for imaging follow-up.  1 year follow-up recommended. 07/23/2024 ultrasound thyroid : Multinodular goiter, thyroid  nodules 2.8 cm and 1.8 cm suspicious needs biopsy GI follow-up regarding the gastric antrum changes   Breast cancer surveillance: Breast exam 06/11/2024: Benign Mammogram left breast: July 2025 at [facility]: Benign   Headaches: Given her history, I will obtain a CT head without contrast. Multinodular goiter with neck pain: Will  obtain a CT neck Breast cancer: Will obtain CT chest abdomen pelvis for restaging  I requested Dr.Thapa with endocrinology to see her sooner.  Follow-up in 2 weeks to discuss results with a telephone visit  No orders of the defined types were placed in this encounter.  The patient has a good understanding of the overall plan. she agrees with it. she will call with any problems that may develop before the next visit here.  I personally spent a total of 30 minutes in the care of the patient today including preparing to see the patient, getting/reviewing separately obtained history, performing a medically appropriate exam/evaluation, counseling and educating, placing orders, referring and communicating with other health care professionals, documenting clinical information in the EHR, independently interpreting results, communicating results, and coordinating care.   Viinay K Kinser Fellman, MD 08/06/24    "

## 2024-08-06 NOTE — Assessment & Plan Note (Signed)
 Previously followed with [facility] oncology with Dr. Phillip Bellini. 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  Bilateral renal calculi, submucosal fibrofatty infiltration gastric antrum could be from chronic gastritis pelvic congestion syndrome, nodular thyroid  enlargement   Plan: GYN follow-up regarding the pelvic congestion syndrome Thyroid  ultrasound 07/03/2023: Multinodular goiter.  2 nodules in the left thyroid  lobe meet criteria for imaging follow-up.  1 year follow-up recommended. GI follow-up regarding the gastric antrum changes   Breast cancer surveillance: Breast exam 06/11/2024: Benign Mammogram left breast: July 2025 at [facility]: Benign  Thyroid  nodule 07/23/2024 ultrasound thyroid : Multinodular goiter, thyroid  nodules 2.8 cm and 1.8 cm suspicious needs biopsy

## 2024-08-06 NOTE — Telephone Encounter (Signed)
 Pt called w/ request for 13 hour prep due to contrast allergy for her upcoming CT scans. Per MD order placed and instructions given to pt to take prednisone  as follows: 1 tablet at 1:30 pm, 1 tab at 7:30 am and 1 tab at 1:30 am since scan is at 2:30 PM. Also told to take her benadryl 50 mb over the counter 1 tab at 1:30 pm. Pt verbalized understanding.

## 2024-08-07 ENCOUNTER — Telehealth: Payer: Self-pay | Admitting: Hematology and Oncology

## 2024-08-07 LAB — THYROID PANEL WITH TSH
Free Thyroxine Index: 2.9 (ref 1.2–4.9)
T3 Uptake Ratio: 30 % (ref 24–39)
T4, Total: 9.6 ug/dL (ref 4.5–12.0)
TSH: 1.53 u[IU]/mL (ref 0.450–4.500)

## 2024-08-07 NOTE — Telephone Encounter (Signed)
 Pt did not answer call nad vm full. Sent a Mychart message in regards to scheduled telephone appt scheduled 2/12 to go over CT scan

## 2024-08-15 ENCOUNTER — Encounter: Payer: Self-pay | Admitting: *Deleted

## 2024-08-15 ENCOUNTER — Ambulatory Visit (HOSPITAL_COMMUNITY)
Admission: RE | Admit: 2024-08-15 | Discharge: 2024-08-15 | Disposition: A | Source: Ambulatory Visit | Attending: Hematology and Oncology

## 2024-08-15 DIAGNOSIS — E041 Nontoxic single thyroid nodule: Secondary | ICD-10-CM

## 2024-08-15 DIAGNOSIS — C50211 Malignant neoplasm of upper-inner quadrant of right female breast: Secondary | ICD-10-CM

## 2024-08-15 DIAGNOSIS — R519 Headache, unspecified: Secondary | ICD-10-CM

## 2024-08-15 MED ORDER — IOHEXOL 300 MG/ML  SOLN
100.0000 mL | Freq: Once | INTRAMUSCULAR | Status: AC | PRN
  Administered 2024-08-15: 100 mL via INTRAVENOUS

## 2024-08-22 ENCOUNTER — Inpatient Hospital Stay: Attending: Hematology and Oncology | Admitting: Hematology and Oncology

## 2024-08-22 ENCOUNTER — Inpatient Hospital Stay: Admitting: Hematology and Oncology

## 2024-09-18 ENCOUNTER — Ambulatory Visit: Payer: Self-pay | Admitting: Family

## 2024-10-03 ENCOUNTER — Ambulatory Visit: Admitting: Endocrinology

## 2024-11-27 ENCOUNTER — Ambulatory Visit: Admitting: Endocrinology

## 2025-03-14 ENCOUNTER — Encounter: Admitting: Family

## 2025-06-11 ENCOUNTER — Inpatient Hospital Stay: Attending: Hematology and Oncology | Admitting: Hematology and Oncology
